# Patient Record
Sex: Female | Born: 1965 | Race: White | Hispanic: No | Marital: Single | Smoking: Former smoker
Health system: Southern US, Community
[De-identification: ages and names within clinical notes are randomized; demographics above are authoritative.]

## PROBLEM LIST (undated history)

## (undated) ENCOUNTER — Encounter
Attending: Student in an Organized Health Care Education/Training Program | Primary: Student in an Organized Health Care Education/Training Program

## (undated) ENCOUNTER — Encounter

## (undated) ENCOUNTER — Encounter: Attending: Psychiatric/Mental Health | Primary: Psychiatric/Mental Health

## (undated) ENCOUNTER — Ambulatory Visit: Payer: MEDICARE

## (undated) ENCOUNTER — Ambulatory Visit

## (undated) ENCOUNTER — Ambulatory Visit: Payer: MEDICARE | Attending: Clinical | Primary: Clinical

## (undated) ENCOUNTER — Telehealth

## (undated) ENCOUNTER — Telehealth: Attending: Clinical | Primary: Clinical

## (undated) ENCOUNTER — Encounter: Attending: Podiatrist | Primary: Podiatrist

## (undated) ENCOUNTER — Telehealth
Attending: Student in an Organized Health Care Education/Training Program | Primary: Student in an Organized Health Care Education/Training Program

## (undated) ENCOUNTER — Encounter: Attending: Clinical | Primary: Clinical

## (undated) ENCOUNTER — Ambulatory Visit: Payer: Medicare (Managed Care)

## (undated) ENCOUNTER — Telehealth: Payer: MEDICARE

## (undated) ENCOUNTER — Encounter: Attending: Vascular Surgery | Primary: Vascular Surgery

## (undated) ENCOUNTER — Ambulatory Visit: Attending: Vascular Surgery | Primary: Vascular Surgery

## (undated) ENCOUNTER — Ambulatory Visit
Payer: MEDICARE | Attending: Student in an Organized Health Care Education/Training Program | Primary: Student in an Organized Health Care Education/Training Program

## (undated) ENCOUNTER — Inpatient Hospital Stay

## (undated) ENCOUNTER — Encounter: Attending: Nephrology | Primary: Nephrology

## (undated) ENCOUNTER — Ambulatory Visit: Attending: Social Worker | Primary: Social Worker

## (undated) ENCOUNTER — Encounter: Attending: Foot & Ankle Surgery | Primary: Foot & Ankle Surgery

## (undated) ENCOUNTER — Encounter: Attending: Geriatric Psychiatry | Primary: Geriatric Psychiatry

## (undated) ENCOUNTER — Encounter: Attending: Family Medicine | Primary: Family Medicine

## (undated) ENCOUNTER — Ambulatory Visit: Payer: MEDICARE | Attending: Psychologist | Primary: Psychologist

## (undated) ENCOUNTER — Telehealth: Attending: Nephrology | Primary: Nephrology

## (undated) ENCOUNTER — Ambulatory Visit: Payer: MEDICARE | Attending: Sports Medicine | Primary: Sports Medicine

## (undated) ENCOUNTER — Ambulatory Visit: Payer: MEDICARE | Attending: Psychiatric/Mental Health | Primary: Psychiatric/Mental Health

## (undated) ENCOUNTER — Ambulatory Visit: Attending: Foot & Ankle Surgery | Primary: Foot & Ankle Surgery

## (undated) ENCOUNTER — Ambulatory Visit: Payer: MEDICARE | Attending: Ophthalmology | Primary: Ophthalmology

## (undated) ENCOUNTER — Encounter: Payer: Medicare (Managed Care) | Attending: Psychiatric/Mental Health | Primary: Psychiatric/Mental Health

## (undated) ENCOUNTER — Telehealth: Attending: Psychiatric/Mental Health | Primary: Psychiatric/Mental Health

## (undated) ENCOUNTER — Ambulatory Visit: Payer: MEDICARE | Attending: Foot & Ankle Surgery | Primary: Foot & Ankle Surgery

## (undated) ENCOUNTER — Ambulatory Visit: Payer: MEDICARE | Attending: Vascular Surgery | Primary: Vascular Surgery

## (undated) ENCOUNTER — Ambulatory Visit
Payer: Medicare (Managed Care) | Attending: Student in an Organized Health Care Education/Training Program | Primary: Student in an Organized Health Care Education/Training Program

## (undated) ENCOUNTER — Encounter: Attending: Psychiatry | Primary: Psychiatry

## (undated) ENCOUNTER — Telehealth: Payer: MEDICARE | Attending: Family Medicine | Primary: Family Medicine

## (undated) ENCOUNTER — Ambulatory Visit: Payer: MEDICARE | Attending: Surgery | Primary: Surgery

## (undated) ENCOUNTER — Ambulatory Visit: Payer: MEDICARE | Attending: Family | Primary: Family

## (undated) ENCOUNTER — Telehealth: Attending: Professional | Primary: Professional

## (undated) ENCOUNTER — Ambulatory Visit
Payer: MEDICARE | Attending: Public Health & General Preventive Medicine | Primary: Public Health & General Preventive Medicine

## (undated) ENCOUNTER — Encounter: Attending: Public Health & General Preventive Medicine | Primary: Public Health & General Preventive Medicine

## (undated) ENCOUNTER — Ambulatory Visit: Payer: MEDICARE | Attending: Family Medicine | Primary: Family Medicine

## (undated) ENCOUNTER — Telehealth
Payer: MEDICARE | Attending: Student in an Organized Health Care Education/Training Program | Primary: Student in an Organized Health Care Education/Training Program

## (undated) ENCOUNTER — Ambulatory Visit
Payer: MEDICARE | Attending: Rehabilitative and Restorative Service Providers" | Primary: Rehabilitative and Restorative Service Providers"

## (undated) ENCOUNTER — Telehealth: Attending: Rural Health | Primary: Rural Health

## (undated) ENCOUNTER — Ambulatory Visit: Payer: MEDICARE | Attending: Student Health | Primary: Student Health

## (undated) ENCOUNTER — Telehealth: Attending: "Endocrinology | Primary: "Endocrinology

## (undated) ENCOUNTER — Ambulatory Visit
Payer: MEDICARE | Attending: Neurology with Special Qualifications in Child Neurology | Primary: Neurology with Special Qualifications in Child Neurology

## (undated) ENCOUNTER — Ambulatory Visit: Payer: MEDICARE | Attending: Physician Assistant | Primary: Physician Assistant

## (undated) ENCOUNTER — Encounter: Attending: Family | Primary: Family

## (undated) DIAGNOSIS — E119 Type 2 diabetes mellitus without complications: Secondary | ICD-10-CM

## (undated) DIAGNOSIS — E079 Disorder of thyroid, unspecified: Secondary | ICD-10-CM

## (undated) DIAGNOSIS — I519 Heart disease, unspecified: Secondary | ICD-10-CM

## (undated) DIAGNOSIS — I10 Essential (primary) hypertension: Secondary | ICD-10-CM

## (undated) DIAGNOSIS — I2699 Other pulmonary embolism without acute cor pulmonale: Secondary | ICD-10-CM

## (undated) DIAGNOSIS — N289 Disorder of kidney and ureter, unspecified: Secondary | ICD-10-CM

## (undated) HISTORY — PX: ABDOMINAL HYSTERECTOMY: SHX81

## (undated) HISTORY — PX: GASTRIC BYPASS: SHX52

## (undated) HISTORY — PX: HERNIA REPAIR: SHX51

## (undated) HISTORY — PX: BACK SURGERY: SHX140

## (undated) HISTORY — PX: GALLBLADDER SURGERY: SHX652

## (undated) HISTORY — PX: AV FISTULA PLACEMENT: SHX1204

## (undated) HISTORY — PX: COSMETIC SURGERY: SHX468

## (undated) MED ORDER — ALBUTEROL SULFATE 2.5 MG/3 ML (0.083 %) SOLUTION FOR NEBULIZATION: 0 days

## (undated) MED ORDER — METOCLOPRAMIDE 10 MG TABLET: Freq: Every day | ORAL | 0.00000 days | PRN

## (undated) MED ORDER — LOSARTAN 25 MG TABLET: Freq: Every day | ORAL | 0 days

## (undated) MED ORDER — HYDROXYZINE HCL 10 MG TABLET: Freq: Three times a day (TID) | ORAL | 0.00000 days | PRN

## (undated) MED ORDER — CYCLOBENZAPRINE 5 MG TABLET: 0 days

## (undated) MED ORDER — ERGOCALCIFEROL (VITAMIN D2) 1,250 MCG (50,000 UNIT) CAPSULE: 0 days

## (undated) MED ORDER — METOPROLOL TARTRATE 100 MG TABLET: 0 days

## (undated) MED ORDER — INSULIN ASPART (U-100) 100 UNIT/ML (3 ML) SUBCUTANEOUS PEN: 0 days

## (undated) MED ORDER — BISACODYL 10 MG RECTAL SUPPOSITORY: 0.00000 days

## (undated) MED ORDER — PROMETHAZINE 25 MG TABLET: 0 days

---
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
## 2017-08-18 ENCOUNTER — Inpatient Hospital Stay
Admission: EM | Admit: 2017-08-18 | Discharge: 2017-09-16 | Disposition: A | Discharge status: Home / Self Care | Payer: MEDICARE | Source: Intra-hospital | Attending: Certified Registered" | Admitting: Certified Registered"

## 2017-08-18 ENCOUNTER — Inpatient Hospital Stay
Admission: EM | Admit: 2017-08-18 | Discharge: 2017-09-16 | Disposition: A | Discharge status: Home / Self Care | Payer: MEDICARE | Source: Intra-hospital

## 2017-08-18 ENCOUNTER — Inpatient Hospital Stay
Admission: EM | Admit: 2017-08-18 | Discharge: 2017-09-16 | Disposition: A | Discharge status: Home / Self Care | Payer: MEDICARE | Source: Intra-hospital | Attending: Internal Medicine | Admitting: Internal Medicine

## 2017-08-18 DIAGNOSIS — L03114 Cellulitis of left upper limb: Principal | ICD-10-CM

## 2017-08-19 DIAGNOSIS — L03114 Cellulitis of left upper limb: Principal | ICD-10-CM

## 2017-08-20 DIAGNOSIS — L03114 Cellulitis of left upper limb: Principal | ICD-10-CM

## 2017-08-30 DIAGNOSIS — L03114 Cellulitis of left upper limb: Principal | ICD-10-CM

## 2017-09-12 DIAGNOSIS — L03114 Cellulitis of left upper limb: Principal | ICD-10-CM

## 2017-09-13 DIAGNOSIS — L03114 Cellulitis of left upper limb: Principal | ICD-10-CM

## 2017-09-16 MED ORDER — POLYETHYLENE GLYCOL 3350 17 GRAM ORAL POWDER PACKET
PACK | Freq: Every day | ORAL | 0 refills | 0.00000 days | Status: CP | PRN
Start: 2017-09-16 — End: 2017-09-16

## 2017-09-16 MED ORDER — NICOTINE (POLACRILEX) 4 MG GUM
BUCCAL | 0 refills | 0.00000 days | PRN
Start: 2017-09-16 — End: 2017-10-16

## 2017-09-16 MED ORDER — METOPROLOL SUCCINATE ER 50 MG TABLET,EXTENDED RELEASE 24 HR
ORAL_TABLET | Freq: Two times a day (BID) | ORAL | 0 refills | 0.00000 days | Status: CP
Start: 2017-09-16 — End: 2018-12-25

## 2017-09-16 MED ORDER — QUETIAPINE 100 MG TABLET: 100 mg | each | 0 refills | 0 days

## 2017-09-16 MED ORDER — CITALOPRAM 40 MG TABLET
ORAL_TABLET | Freq: Every evening | ORAL | 0 refills | 0.00000 days | Status: CP
Start: 2017-09-16 — End: 2018-12-25

## 2017-09-16 MED ORDER — BENZTROPINE 0.5 MG TABLET: 1 mg | tablet | Freq: Two times a day (BID) | 11 refills | 0 days | Status: AC

## 2017-09-16 MED ORDER — ACETAMINOPHEN 325 MG TABLET
Freq: Four times a day (QID) | ORAL | 0 refills | 0.00000 days
Start: 2017-09-16 — End: 2017-09-23

## 2017-09-16 MED ORDER — GABAPENTIN 100 MG CAPSULE: 200 mg | capsule | Freq: Two times a day (BID) | 0 refills | 0 days | Status: AC

## 2017-09-16 MED ORDER — TRAZODONE 50 MG TABLET
ORAL_TABLET | Freq: Every evening | ORAL | 3 refills | 0.00000 days | Status: CP | PRN
Start: 2017-09-16 — End: 2017-09-16

## 2017-09-16 MED ORDER — OXYCODONE 10 MG TABLET: 10 mg | tablet | 0 refills | 0 days | Status: AC

## 2017-09-16 MED ORDER — SENNOSIDES 8.6 MG TABLET: 2 | tablet | Freq: Two times a day (BID) | 0 refills | 0 days | Status: AC

## 2017-09-16 MED ORDER — POLYETHYLENE GLYCOL 3350 17 GRAM ORAL POWDER PACKET: 17 g | packet | Freq: Every day | 0 refills | 0 days | Status: AC

## 2017-09-16 MED ORDER — QUETIAPINE 100 MG TABLET: 100 mg | tablet | Freq: Every evening | 0 refills | 0 days | Status: AC

## 2017-09-16 MED ORDER — TRAZODONE 50 MG TABLET: 50 mg | tablet | Freq: Every evening | 3 refills | 0 days | Status: AC

## 2017-09-16 MED ORDER — GABAPENTIN 400 MG CAPSULE: 400 mg | capsule | Freq: Every evening | 0 refills | 0 days | Status: AC

## 2017-09-16 MED ORDER — GABAPENTIN 100 MG CAPSULE
ORAL_CAPSULE | Freq: Two times a day (BID) | ORAL | 0 refills | 0.00000 days | Status: CP
Start: 2017-09-16 — End: 2017-09-16

## 2017-09-16 MED ORDER — GABAPENTIN 400 MG CAPSULE
ORAL_CAPSULE | Freq: Every evening | ORAL | 0 refills | 0.00000 days | Status: CP
Start: 2017-09-16 — End: 2017-09-16

## 2017-09-16 MED ORDER — MELATONIN 3 MG TABLET
ORAL_TABLET | Freq: Every evening | ORAL | 0 refills | 0 days | Status: CP
Start: 2017-09-16 — End: ?

## 2017-09-16 MED ORDER — GABAPENTIN 400 MG CAPSULE: 400 mg | capsule | Freq: Every evening | 0 refills | 0 days | Status: SS

## 2017-09-16 MED ORDER — NICOTINE (POLACRILEX) 4 MG GUM: 4 mg | each | 0 refills | 0 days

## 2017-09-16 MED ORDER — OXYCODONE 10 MG TABLET
ORAL_TABLET | ORAL | 0 refills | 0.00000 days | Status: CP | PRN
Start: 2017-09-16 — End: 2017-09-16

## 2017-09-16 MED ORDER — POLYETHYLENE GLYCOL 3350 17 GRAM ORAL POWDER PACKET: g | 0 refills | 0 days

## 2017-09-16 MED ORDER — METFORMIN 500 MG TABLET: 500 mg | tablet | Freq: Two times a day (BID) | 3 refills | 0 days | Status: AC

## 2017-09-16 MED ORDER — METFORMIN 500 MG TABLET: 500 mg | tablet | 3 refills | 0 days

## 2017-09-16 MED ORDER — BENZTROPINE 0.5 MG TABLET
ORAL_TABLET | Freq: Two times a day (BID) | ORAL | 3 refills | 0.00000 days | Status: CP
Start: 2017-09-16 — End: 2018-12-29

## 2017-09-16 MED ORDER — METFORMIN 500 MG TABLET
ORAL_TABLET | Freq: Two times a day (BID) | ORAL | 3 refills | 0.00000 days | Status: CP
Start: 2017-09-16 — End: 2018-12-25

## 2017-09-16 MED ORDER — SENNOSIDES 8.6 MG TABLET
ORAL_TABLET | Freq: Two times a day (BID) | ORAL | 0 refills | 0.00000 days | Status: CP
Start: 2017-09-16 — End: 2018-09-16

## 2017-09-16 MED ORDER — QUETIAPINE 100 MG TABLET
ORAL_TABLET | Freq: Every evening | ORAL | 0 refills | 0.00000 days | Status: CP
Start: 2017-09-16 — End: 2017-09-16

## 2017-09-16 MED ORDER — INSULIN NPH ISOPHANE U-100 HUMAN 100 UNIT/ML SUBCUTANEOUS SUSPENSION
Freq: Every day | SUBCUTANEOUS | 0 refills | 0 days | Status: CP
Start: 2017-09-16 — End: 2018-12-25

## 2017-09-16 MED ORDER — CITALOPRAM 40 MG TABLET: 40 mg | tablet | Freq: Every evening | 0 refills | 0 days | Status: AC

## 2017-09-16 MED ORDER — GABAPENTIN 100 MG CAPSULE: capsule | 0 refills | 0 days | Status: SS

## 2017-09-16 MED ORDER — INSULIN GLARGINE (U-100) 100 UNIT/ML SUBCUTANEOUS SOLUTION: 30 [IU] | mL | 2 refills | 0 days

## 2017-09-16 MED ORDER — INSULIN GLARGINE (U-100) 100 UNIT/ML SUBCUTANEOUS SOLUTION
Freq: Two times a day (BID) | SUBCUTANEOUS | 2 refills | 0.00000 days | Status: CP
Start: 2017-09-16 — End: 2017-09-16

## 2017-09-16 MED ORDER — MELATONIN 5 MG TABLET
ORAL_TABLET | Freq: Every evening | ORAL | 0 refills | 0.00000 days | PRN
Start: 2017-09-16 — End: 2018-12-25

## 2017-09-16 MED ORDER — METOPROLOL SUCCINATE ER 50 MG TABLET,EXTENDED RELEASE 24 HR: 50 mg | tablet | Freq: Two times a day (BID) | 0 refills | 0 days | Status: AC

## 2017-09-16 MED ORDER — TRAZODONE 150 MG TABLET
ORAL_TABLET | Freq: Every evening | ORAL | 3 refills | 0 days | Status: CP
Start: 2017-09-16 — End: 2018-12-25

## 2017-09-21 ENCOUNTER — Ambulatory Visit: Admission: RE | Admit: 2017-09-21 | Discharge: 2017-09-21 | Payer: MEDICARE | Attending: Orthopaedic Surgery

## 2017-09-21 DIAGNOSIS — S61509A Unspecified open wound of unspecified wrist, initial encounter: Principal | ICD-10-CM

## 2017-09-21 MED ORDER — TRAMADOL 50 MG TABLET
ORAL_TABLET | Freq: Four times a day (QID) | ORAL | 0 refills | 0.00000 days | Status: CP
Start: 2017-09-21 — End: 2018-12-25

## 2018-12-25 ENCOUNTER — Encounter: Admit: 2018-12-25 | Discharge: 2018-12-29 | Payer: MEDICARE

## 2018-12-25 ENCOUNTER — Ambulatory Visit: Admit: 2018-12-25 | Discharge: 2018-12-29 | Payer: MEDICARE

## 2018-12-25 ENCOUNTER — Non-Acute Institutional Stay: Admit: 2018-12-25 | Discharge: 2018-12-29 | Payer: MEDICARE

## 2018-12-25 DIAGNOSIS — R42 Dizziness and giddiness: Principal | ICD-10-CM

## 2018-12-26 DIAGNOSIS — R42 Dizziness and giddiness: Principal | ICD-10-CM

## 2018-12-28 DIAGNOSIS — R42 Dizziness and giddiness: Principal | ICD-10-CM

## 2018-12-29 MED ORDER — ISOSORBIDE MONONITRATE ER 30 MG TABLET,EXTENDED RELEASE 24 HR
ORAL_TABLET | Freq: Every day | ORAL | 0 refills | 0.00000 days | Status: CP
Start: 2018-12-29 — End: ?

## 2018-12-29 MED ORDER — GABAPENTIN 100 MG CAPSULE
ORAL_CAPSULE | Freq: Two times a day (BID) | ORAL | 0 refills | 0.00000 days | Status: CP
Start: 2018-12-29 — End: 2019-01-28

## 2018-12-29 MED ORDER — SODIUM POLYSTYRENE SULFONATE 15 GRAM/60 ML ORAL SUSPENSION
0 refills | 0 days | Status: CP
Start: 2018-12-29 — End: ?

## 2018-12-29 MED ORDER — GABAPENTIN 400 MG CAPSULE
ORAL_CAPSULE | Freq: Every evening | ORAL | 0 refills | 0 days | Status: CP
Start: 2018-12-29 — End: 2019-01-28

## 2018-12-29 MED ORDER — FUROSEMIDE 40 MG TABLET
ORAL_TABLET | Freq: Every day | ORAL | 0 refills | 0 days
Start: 2018-12-29 — End: 2019-01-28

## 2021-01-06 DIAGNOSIS — R339 Retention of urine, unspecified: Principal | ICD-10-CM

## 2021-01-06 DIAGNOSIS — I252 Old myocardial infarction: Principal | ICD-10-CM

## 2021-01-06 DIAGNOSIS — G894 Chronic pain syndrome: Principal | ICD-10-CM

## 2021-01-06 DIAGNOSIS — Z9049 Acquired absence of other specified parts of digestive tract: Principal | ICD-10-CM

## 2021-01-06 DIAGNOSIS — Z7982 Long term (current) use of aspirin: Principal | ICD-10-CM

## 2021-01-06 DIAGNOSIS — E1022 Type 1 diabetes mellitus with diabetic chronic kidney disease: Principal | ICD-10-CM

## 2021-01-06 DIAGNOSIS — E039 Hypothyroidism, unspecified: Principal | ICD-10-CM

## 2021-01-06 DIAGNOSIS — Z955 Presence of coronary angioplasty implant and graft: Principal | ICD-10-CM

## 2021-01-06 DIAGNOSIS — I132 Hypertensive heart and chronic kidney disease with heart failure and with stage 5 chronic kidney disease, or end stage renal disease: Principal | ICD-10-CM

## 2021-01-06 DIAGNOSIS — E871 Hypo-osmolality and hyponatremia: Principal | ICD-10-CM

## 2021-01-06 DIAGNOSIS — L03114 Cellulitis of left upper limb: Principal | ICD-10-CM

## 2021-01-06 DIAGNOSIS — I059 Rheumatic mitral valve disease, unspecified: Principal | ICD-10-CM

## 2021-01-06 DIAGNOSIS — F1120 Opioid dependence, uncomplicated: Principal | ICD-10-CM

## 2021-01-06 DIAGNOSIS — Z90710 Acquired absence of both cervix and uterus: Principal | ICD-10-CM

## 2021-01-06 DIAGNOSIS — Z20822 Contact with and (suspected) exposure to covid-19: Principal | ICD-10-CM

## 2021-01-06 DIAGNOSIS — B182 Chronic viral hepatitis C: Principal | ICD-10-CM

## 2021-01-06 DIAGNOSIS — Z951 Presence of aortocoronary bypass graft: Principal | ICD-10-CM

## 2021-01-06 DIAGNOSIS — F419 Anxiety disorder, unspecified: Principal | ICD-10-CM

## 2021-01-06 DIAGNOSIS — T82898A Other specified complication of vascular prosthetic devices, implants and grafts, initial encounter: Principal | ICD-10-CM

## 2021-01-06 DIAGNOSIS — Z79899 Other long term (current) drug therapy: Principal | ICD-10-CM

## 2021-01-06 DIAGNOSIS — F319 Bipolar disorder, unspecified: Principal | ICD-10-CM

## 2021-01-06 DIAGNOSIS — M5136 Other intervertebral disc degeneration, lumbar region: Principal | ICD-10-CM

## 2021-01-06 DIAGNOSIS — I2510 Atherosclerotic heart disease of native coronary artery without angina pectoris: Principal | ICD-10-CM

## 2021-01-06 DIAGNOSIS — Z87891 Personal history of nicotine dependence: Principal | ICD-10-CM

## 2021-01-06 DIAGNOSIS — K219 Gastro-esophageal reflux disease without esophagitis: Principal | ICD-10-CM

## 2021-01-06 DIAGNOSIS — G928 Other toxic encephalopathy: Principal | ICD-10-CM

## 2021-01-06 DIAGNOSIS — T402X5A Adverse effect of other opioids, initial encounter: Principal | ICD-10-CM

## 2021-01-06 DIAGNOSIS — E10649 Type 1 diabetes mellitus with hypoglycemia without coma: Principal | ICD-10-CM

## 2021-01-06 DIAGNOSIS — I350 Nonrheumatic aortic (valve) stenosis: Principal | ICD-10-CM

## 2021-01-06 DIAGNOSIS — Z9884 Bariatric surgery status: Principal | ICD-10-CM

## 2021-01-06 DIAGNOSIS — E875 Hyperkalemia: Principal | ICD-10-CM

## 2021-01-06 DIAGNOSIS — Z794 Long term (current) use of insulin: Principal | ICD-10-CM

## 2021-01-06 DIAGNOSIS — F1910 Other psychoactive substance abuse, uncomplicated: Principal | ICD-10-CM

## 2021-01-06 DIAGNOSIS — I5032 Chronic diastolic (congestive) heart failure: Principal | ICD-10-CM

## 2021-01-06 DIAGNOSIS — R0902 Hypoxemia: Principal | ICD-10-CM

## 2021-01-06 DIAGNOSIS — Z992 Dependence on renal dialysis: Principal | ICD-10-CM

## 2021-01-06 DIAGNOSIS — F11121 Opioid abuse with intoxication delirium: Principal | ICD-10-CM

## 2021-01-06 DIAGNOSIS — M1990 Unspecified osteoarthritis, unspecified site: Principal | ICD-10-CM

## 2021-01-06 DIAGNOSIS — E222 Syndrome of inappropriate secretion of antidiuretic hormone: Principal | ICD-10-CM

## 2021-01-06 DIAGNOSIS — D631 Anemia in chronic kidney disease: Principal | ICD-10-CM

## 2021-01-06 DIAGNOSIS — E785 Hyperlipidemia, unspecified: Principal | ICD-10-CM

## 2021-01-06 DIAGNOSIS — E8770 Fluid overload, unspecified: Principal | ICD-10-CM

## 2021-01-06 DIAGNOSIS — Z6834 Body mass index (BMI) 34.0-34.9, adult: Principal | ICD-10-CM

## 2021-01-06 DIAGNOSIS — N186 End stage renal disease: Principal | ICD-10-CM

## 2021-01-07 ENCOUNTER — Ambulatory Visit
Admit: 2021-01-07 | Discharge: 2021-01-15 | Disposition: A | Discharge status: Home Health | Payer: MEDICARE | Admitting: Nephrology

## 2021-01-07 DIAGNOSIS — Z79899 Other long term (current) drug therapy: Principal | ICD-10-CM

## 2021-01-07 DIAGNOSIS — Z87891 Personal history of nicotine dependence: Principal | ICD-10-CM

## 2021-01-07 DIAGNOSIS — G928 Other toxic encephalopathy: Principal | ICD-10-CM

## 2021-01-07 DIAGNOSIS — E785 Hyperlipidemia, unspecified: Principal | ICD-10-CM

## 2021-01-07 DIAGNOSIS — D631 Anemia in chronic kidney disease: Principal | ICD-10-CM

## 2021-01-07 DIAGNOSIS — M1990 Unspecified osteoarthritis, unspecified site: Principal | ICD-10-CM

## 2021-01-07 DIAGNOSIS — E8770 Fluid overload, unspecified: Principal | ICD-10-CM

## 2021-01-07 DIAGNOSIS — K219 Gastro-esophageal reflux disease without esophagitis: Principal | ICD-10-CM

## 2021-01-07 DIAGNOSIS — E039 Hypothyroidism, unspecified: Principal | ICD-10-CM

## 2021-01-07 DIAGNOSIS — I5032 Chronic diastolic (congestive) heart failure: Principal | ICD-10-CM

## 2021-01-07 DIAGNOSIS — Z7982 Long term (current) use of aspirin: Principal | ICD-10-CM

## 2021-01-07 DIAGNOSIS — R339 Retention of urine, unspecified: Principal | ICD-10-CM

## 2021-01-07 DIAGNOSIS — T402X5A Adverse effect of other opioids, initial encounter: Principal | ICD-10-CM

## 2021-01-07 DIAGNOSIS — E871 Hypo-osmolality and hyponatremia: Principal | ICD-10-CM

## 2021-01-07 DIAGNOSIS — I2510 Atherosclerotic heart disease of native coronary artery without angina pectoris: Principal | ICD-10-CM

## 2021-01-07 DIAGNOSIS — F319 Bipolar disorder, unspecified: Principal | ICD-10-CM

## 2021-01-07 DIAGNOSIS — Z9049 Acquired absence of other specified parts of digestive tract: Principal | ICD-10-CM

## 2021-01-07 DIAGNOSIS — Z992 Dependence on renal dialysis: Principal | ICD-10-CM

## 2021-01-07 DIAGNOSIS — E1022 Type 1 diabetes mellitus with diabetic chronic kidney disease: Principal | ICD-10-CM

## 2021-01-07 DIAGNOSIS — M5136 Other intervertebral disc degeneration, lumbar region: Principal | ICD-10-CM

## 2021-01-07 DIAGNOSIS — G894 Chronic pain syndrome: Principal | ICD-10-CM

## 2021-01-07 DIAGNOSIS — Z794 Long term (current) use of insulin: Principal | ICD-10-CM

## 2021-01-07 DIAGNOSIS — T82898A Other specified complication of vascular prosthetic devices, implants and grafts, initial encounter: Principal | ICD-10-CM

## 2021-01-07 DIAGNOSIS — F11121 Opioid abuse with intoxication delirium: Principal | ICD-10-CM

## 2021-01-07 DIAGNOSIS — R0902 Hypoxemia: Principal | ICD-10-CM

## 2021-01-07 DIAGNOSIS — B182 Chronic viral hepatitis C: Principal | ICD-10-CM

## 2021-01-07 DIAGNOSIS — I132 Hypertensive heart and chronic kidney disease with heart failure and with stage 5 chronic kidney disease, or end stage renal disease: Principal | ICD-10-CM

## 2021-01-07 DIAGNOSIS — Z6834 Body mass index (BMI) 34.0-34.9, adult: Principal | ICD-10-CM

## 2021-01-07 DIAGNOSIS — Z9884 Bariatric surgery status: Principal | ICD-10-CM

## 2021-01-07 DIAGNOSIS — N186 End stage renal disease: Principal | ICD-10-CM

## 2021-01-07 DIAGNOSIS — E10649 Type 1 diabetes mellitus with hypoglycemia without coma: Principal | ICD-10-CM

## 2021-01-07 DIAGNOSIS — F419 Anxiety disorder, unspecified: Principal | ICD-10-CM

## 2021-01-07 DIAGNOSIS — I059 Rheumatic mitral valve disease, unspecified: Principal | ICD-10-CM

## 2021-01-07 DIAGNOSIS — Z955 Presence of coronary angioplasty implant and graft: Principal | ICD-10-CM

## 2021-01-07 DIAGNOSIS — E222 Syndrome of inappropriate secretion of antidiuretic hormone: Principal | ICD-10-CM

## 2021-01-07 DIAGNOSIS — E875 Hyperkalemia: Principal | ICD-10-CM

## 2021-01-07 DIAGNOSIS — Z951 Presence of aortocoronary bypass graft: Principal | ICD-10-CM

## 2021-01-07 DIAGNOSIS — Z90710 Acquired absence of both cervix and uterus: Principal | ICD-10-CM

## 2021-01-07 DIAGNOSIS — I252 Old myocardial infarction: Principal | ICD-10-CM

## 2021-01-07 DIAGNOSIS — Z20822 Contact with and (suspected) exposure to covid-19: Principal | ICD-10-CM

## 2021-01-07 DIAGNOSIS — L03114 Cellulitis of left upper limb: Principal | ICD-10-CM

## 2021-01-07 DIAGNOSIS — I350 Nonrheumatic aortic (valve) stenosis: Principal | ICD-10-CM

## 2021-01-08 DIAGNOSIS — Z9884 Bariatric surgery status: Principal | ICD-10-CM

## 2021-01-08 DIAGNOSIS — B182 Chronic viral hepatitis C: Principal | ICD-10-CM

## 2021-01-08 DIAGNOSIS — I350 Nonrheumatic aortic (valve) stenosis: Principal | ICD-10-CM

## 2021-01-08 DIAGNOSIS — Z955 Presence of coronary angioplasty implant and graft: Principal | ICD-10-CM

## 2021-01-08 DIAGNOSIS — R339 Retention of urine, unspecified: Principal | ICD-10-CM

## 2021-01-08 DIAGNOSIS — E222 Syndrome of inappropriate secretion of antidiuretic hormone: Principal | ICD-10-CM

## 2021-01-08 DIAGNOSIS — E871 Hypo-osmolality and hyponatremia: Principal | ICD-10-CM

## 2021-01-08 DIAGNOSIS — T402X5A Adverse effect of other opioids, initial encounter: Principal | ICD-10-CM

## 2021-01-08 DIAGNOSIS — G928 Other toxic encephalopathy: Principal | ICD-10-CM

## 2021-01-08 DIAGNOSIS — E875 Hyperkalemia: Principal | ICD-10-CM

## 2021-01-08 DIAGNOSIS — Z992 Dependence on renal dialysis: Principal | ICD-10-CM

## 2021-01-08 DIAGNOSIS — E10649 Type 1 diabetes mellitus with hypoglycemia without coma: Principal | ICD-10-CM

## 2021-01-08 DIAGNOSIS — F319 Bipolar disorder, unspecified: Principal | ICD-10-CM

## 2021-01-08 DIAGNOSIS — I2510 Atherosclerotic heart disease of native coronary artery without angina pectoris: Principal | ICD-10-CM

## 2021-01-08 DIAGNOSIS — Z6834 Body mass index (BMI) 34.0-34.9, adult: Principal | ICD-10-CM

## 2021-01-08 DIAGNOSIS — I252 Old myocardial infarction: Principal | ICD-10-CM

## 2021-01-08 DIAGNOSIS — I5032 Chronic diastolic (congestive) heart failure: Principal | ICD-10-CM

## 2021-01-08 DIAGNOSIS — Z794 Long term (current) use of insulin: Principal | ICD-10-CM

## 2021-01-08 DIAGNOSIS — N186 End stage renal disease: Principal | ICD-10-CM

## 2021-01-08 DIAGNOSIS — E039 Hypothyroidism, unspecified: Principal | ICD-10-CM

## 2021-01-08 DIAGNOSIS — Z20822 Contact with and (suspected) exposure to covid-19: Principal | ICD-10-CM

## 2021-01-08 DIAGNOSIS — G894 Chronic pain syndrome: Principal | ICD-10-CM

## 2021-01-08 DIAGNOSIS — L03114 Cellulitis of left upper limb: Principal | ICD-10-CM

## 2021-01-08 DIAGNOSIS — I059 Rheumatic mitral valve disease, unspecified: Principal | ICD-10-CM

## 2021-01-08 DIAGNOSIS — Z9049 Acquired absence of other specified parts of digestive tract: Principal | ICD-10-CM

## 2021-01-08 DIAGNOSIS — I132 Hypertensive heart and chronic kidney disease with heart failure and with stage 5 chronic kidney disease, or end stage renal disease: Principal | ICD-10-CM

## 2021-01-08 DIAGNOSIS — E8770 Fluid overload, unspecified: Principal | ICD-10-CM

## 2021-01-08 DIAGNOSIS — Z7982 Long term (current) use of aspirin: Principal | ICD-10-CM

## 2021-01-08 DIAGNOSIS — M1990 Unspecified osteoarthritis, unspecified site: Principal | ICD-10-CM

## 2021-01-08 DIAGNOSIS — R0902 Hypoxemia: Principal | ICD-10-CM

## 2021-01-08 DIAGNOSIS — T82898A Other specified complication of vascular prosthetic devices, implants and grafts, initial encounter: Principal | ICD-10-CM

## 2021-01-08 DIAGNOSIS — M5136 Other intervertebral disc degeneration, lumbar region: Principal | ICD-10-CM

## 2021-01-08 DIAGNOSIS — Z951 Presence of aortocoronary bypass graft: Principal | ICD-10-CM

## 2021-01-08 DIAGNOSIS — F419 Anxiety disorder, unspecified: Principal | ICD-10-CM

## 2021-01-08 DIAGNOSIS — Z79899 Other long term (current) drug therapy: Principal | ICD-10-CM

## 2021-01-08 DIAGNOSIS — K219 Gastro-esophageal reflux disease without esophagitis: Principal | ICD-10-CM

## 2021-01-08 DIAGNOSIS — F11121 Opioid abuse with intoxication delirium: Principal | ICD-10-CM

## 2021-01-08 DIAGNOSIS — D631 Anemia in chronic kidney disease: Principal | ICD-10-CM

## 2021-01-08 DIAGNOSIS — E785 Hyperlipidemia, unspecified: Principal | ICD-10-CM

## 2021-01-08 DIAGNOSIS — Z90710 Acquired absence of both cervix and uterus: Principal | ICD-10-CM

## 2021-01-08 DIAGNOSIS — E1022 Type 1 diabetes mellitus with diabetic chronic kidney disease: Principal | ICD-10-CM

## 2021-01-08 DIAGNOSIS — Z87891 Personal history of nicotine dependence: Principal | ICD-10-CM

## 2021-01-09 DIAGNOSIS — F419 Anxiety disorder, unspecified: Principal | ICD-10-CM

## 2021-01-09 DIAGNOSIS — R0902 Hypoxemia: Principal | ICD-10-CM

## 2021-01-09 DIAGNOSIS — Z794 Long term (current) use of insulin: Principal | ICD-10-CM

## 2021-01-09 DIAGNOSIS — T82898A Other specified complication of vascular prosthetic devices, implants and grafts, initial encounter: Principal | ICD-10-CM

## 2021-01-09 DIAGNOSIS — B182 Chronic viral hepatitis C: Principal | ICD-10-CM

## 2021-01-09 DIAGNOSIS — Z9884 Bariatric surgery status: Principal | ICD-10-CM

## 2021-01-09 DIAGNOSIS — E1022 Type 1 diabetes mellitus with diabetic chronic kidney disease: Principal | ICD-10-CM

## 2021-01-09 DIAGNOSIS — I252 Old myocardial infarction: Principal | ICD-10-CM

## 2021-01-09 DIAGNOSIS — M5136 Other intervertebral disc degeneration, lumbar region: Principal | ICD-10-CM

## 2021-01-09 DIAGNOSIS — I350 Nonrheumatic aortic (valve) stenosis: Principal | ICD-10-CM

## 2021-01-09 DIAGNOSIS — E8770 Fluid overload, unspecified: Principal | ICD-10-CM

## 2021-01-09 DIAGNOSIS — E222 Syndrome of inappropriate secretion of antidiuretic hormone: Principal | ICD-10-CM

## 2021-01-09 DIAGNOSIS — Z20822 Contact with and (suspected) exposure to covid-19: Principal | ICD-10-CM

## 2021-01-09 DIAGNOSIS — Z90710 Acquired absence of both cervix and uterus: Principal | ICD-10-CM

## 2021-01-09 DIAGNOSIS — Z87891 Personal history of nicotine dependence: Principal | ICD-10-CM

## 2021-01-09 DIAGNOSIS — Z951 Presence of aortocoronary bypass graft: Principal | ICD-10-CM

## 2021-01-09 DIAGNOSIS — F11121 Opioid abuse with intoxication delirium: Principal | ICD-10-CM

## 2021-01-09 DIAGNOSIS — E871 Hypo-osmolality and hyponatremia: Principal | ICD-10-CM

## 2021-01-09 DIAGNOSIS — E875 Hyperkalemia: Principal | ICD-10-CM

## 2021-01-09 DIAGNOSIS — F319 Bipolar disorder, unspecified: Principal | ICD-10-CM

## 2021-01-09 DIAGNOSIS — E039 Hypothyroidism, unspecified: Principal | ICD-10-CM

## 2021-01-09 DIAGNOSIS — M1990 Unspecified osteoarthritis, unspecified site: Principal | ICD-10-CM

## 2021-01-09 DIAGNOSIS — Z6834 Body mass index (BMI) 34.0-34.9, adult: Principal | ICD-10-CM

## 2021-01-09 DIAGNOSIS — I059 Rheumatic mitral valve disease, unspecified: Principal | ICD-10-CM

## 2021-01-09 DIAGNOSIS — G928 Other toxic encephalopathy: Principal | ICD-10-CM

## 2021-01-09 DIAGNOSIS — Z992 Dependence on renal dialysis: Principal | ICD-10-CM

## 2021-01-09 DIAGNOSIS — R339 Retention of urine, unspecified: Principal | ICD-10-CM

## 2021-01-09 DIAGNOSIS — I5032 Chronic diastolic (congestive) heart failure: Principal | ICD-10-CM

## 2021-01-09 DIAGNOSIS — N186 End stage renal disease: Principal | ICD-10-CM

## 2021-01-09 DIAGNOSIS — D631 Anemia in chronic kidney disease: Principal | ICD-10-CM

## 2021-01-09 DIAGNOSIS — Z79899 Other long term (current) drug therapy: Principal | ICD-10-CM

## 2021-01-09 DIAGNOSIS — L03114 Cellulitis of left upper limb: Principal | ICD-10-CM

## 2021-01-09 DIAGNOSIS — T402X5A Adverse effect of other opioids, initial encounter: Principal | ICD-10-CM

## 2021-01-09 DIAGNOSIS — Z7982 Long term (current) use of aspirin: Principal | ICD-10-CM

## 2021-01-09 DIAGNOSIS — I2510 Atherosclerotic heart disease of native coronary artery without angina pectoris: Principal | ICD-10-CM

## 2021-01-09 DIAGNOSIS — E10649 Type 1 diabetes mellitus with hypoglycemia without coma: Principal | ICD-10-CM

## 2021-01-09 DIAGNOSIS — G894 Chronic pain syndrome: Principal | ICD-10-CM

## 2021-01-09 DIAGNOSIS — E785 Hyperlipidemia, unspecified: Principal | ICD-10-CM

## 2021-01-09 DIAGNOSIS — Z955 Presence of coronary angioplasty implant and graft: Principal | ICD-10-CM

## 2021-01-09 DIAGNOSIS — K219 Gastro-esophageal reflux disease without esophagitis: Principal | ICD-10-CM

## 2021-01-09 DIAGNOSIS — Z9049 Acquired absence of other specified parts of digestive tract: Principal | ICD-10-CM

## 2021-01-09 DIAGNOSIS — I132 Hypertensive heart and chronic kidney disease with heart failure and with stage 5 chronic kidney disease, or end stage renal disease: Principal | ICD-10-CM

## 2021-01-11 DIAGNOSIS — Z951 Presence of aortocoronary bypass graft: Principal | ICD-10-CM

## 2021-01-11 DIAGNOSIS — Z79899 Other long term (current) drug therapy: Principal | ICD-10-CM

## 2021-01-11 DIAGNOSIS — Z794 Long term (current) use of insulin: Principal | ICD-10-CM

## 2021-01-11 DIAGNOSIS — M1990 Unspecified osteoarthritis, unspecified site: Principal | ICD-10-CM

## 2021-01-11 DIAGNOSIS — G928 Other toxic encephalopathy: Principal | ICD-10-CM

## 2021-01-11 DIAGNOSIS — E1022 Type 1 diabetes mellitus with diabetic chronic kidney disease: Principal | ICD-10-CM

## 2021-01-11 DIAGNOSIS — E875 Hyperkalemia: Principal | ICD-10-CM

## 2021-01-11 DIAGNOSIS — E222 Syndrome of inappropriate secretion of antidiuretic hormone: Principal | ICD-10-CM

## 2021-01-11 DIAGNOSIS — G894 Chronic pain syndrome: Principal | ICD-10-CM

## 2021-01-11 DIAGNOSIS — K219 Gastro-esophageal reflux disease without esophagitis: Principal | ICD-10-CM

## 2021-01-11 DIAGNOSIS — I2510 Atherosclerotic heart disease of native coronary artery without angina pectoris: Principal | ICD-10-CM

## 2021-01-11 DIAGNOSIS — I350 Nonrheumatic aortic (valve) stenosis: Principal | ICD-10-CM

## 2021-01-11 DIAGNOSIS — E039 Hypothyroidism, unspecified: Principal | ICD-10-CM

## 2021-01-11 DIAGNOSIS — Z955 Presence of coronary angioplasty implant and graft: Principal | ICD-10-CM

## 2021-01-11 DIAGNOSIS — R0902 Hypoxemia: Principal | ICD-10-CM

## 2021-01-11 DIAGNOSIS — F319 Bipolar disorder, unspecified: Principal | ICD-10-CM

## 2021-01-11 DIAGNOSIS — N186 End stage renal disease: Principal | ICD-10-CM

## 2021-01-11 DIAGNOSIS — M5136 Other intervertebral disc degeneration, lumbar region: Principal | ICD-10-CM

## 2021-01-11 DIAGNOSIS — E871 Hypo-osmolality and hyponatremia: Principal | ICD-10-CM

## 2021-01-11 DIAGNOSIS — Z6834 Body mass index (BMI) 34.0-34.9, adult: Principal | ICD-10-CM

## 2021-01-11 DIAGNOSIS — Z9884 Bariatric surgery status: Principal | ICD-10-CM

## 2021-01-11 DIAGNOSIS — Z20822 Contact with and (suspected) exposure to covid-19: Principal | ICD-10-CM

## 2021-01-11 DIAGNOSIS — B182 Chronic viral hepatitis C: Principal | ICD-10-CM

## 2021-01-11 DIAGNOSIS — T402X5A Adverse effect of other opioids, initial encounter: Principal | ICD-10-CM

## 2021-01-11 DIAGNOSIS — I132 Hypertensive heart and chronic kidney disease with heart failure and with stage 5 chronic kidney disease, or end stage renal disease: Principal | ICD-10-CM

## 2021-01-11 DIAGNOSIS — T82898A Other specified complication of vascular prosthetic devices, implants and grafts, initial encounter: Principal | ICD-10-CM

## 2021-01-11 DIAGNOSIS — L03114 Cellulitis of left upper limb: Principal | ICD-10-CM

## 2021-01-11 DIAGNOSIS — I059 Rheumatic mitral valve disease, unspecified: Principal | ICD-10-CM

## 2021-01-11 DIAGNOSIS — E10649 Type 1 diabetes mellitus with hypoglycemia without coma: Principal | ICD-10-CM

## 2021-01-11 DIAGNOSIS — Z87891 Personal history of nicotine dependence: Principal | ICD-10-CM

## 2021-01-11 DIAGNOSIS — E8770 Fluid overload, unspecified: Principal | ICD-10-CM

## 2021-01-11 DIAGNOSIS — F419 Anxiety disorder, unspecified: Principal | ICD-10-CM

## 2021-01-11 DIAGNOSIS — D631 Anemia in chronic kidney disease: Principal | ICD-10-CM

## 2021-01-11 DIAGNOSIS — R339 Retention of urine, unspecified: Principal | ICD-10-CM

## 2021-01-11 DIAGNOSIS — Z9049 Acquired absence of other specified parts of digestive tract: Principal | ICD-10-CM

## 2021-01-11 DIAGNOSIS — Z992 Dependence on renal dialysis: Principal | ICD-10-CM

## 2021-01-11 DIAGNOSIS — Z90710 Acquired absence of both cervix and uterus: Principal | ICD-10-CM

## 2021-01-11 DIAGNOSIS — F11121 Opioid abuse with intoxication delirium: Principal | ICD-10-CM

## 2021-01-11 DIAGNOSIS — I5032 Chronic diastolic (congestive) heart failure: Principal | ICD-10-CM

## 2021-01-11 DIAGNOSIS — I252 Old myocardial infarction: Principal | ICD-10-CM

## 2021-01-11 DIAGNOSIS — Z7982 Long term (current) use of aspirin: Principal | ICD-10-CM

## 2021-01-11 DIAGNOSIS — E785 Hyperlipidemia, unspecified: Principal | ICD-10-CM

## 2021-01-12 DIAGNOSIS — F11121 Opioid abuse with intoxication delirium: Principal | ICD-10-CM

## 2021-01-12 DIAGNOSIS — E10649 Type 1 diabetes mellitus with hypoglycemia without coma: Principal | ICD-10-CM

## 2021-01-12 DIAGNOSIS — F419 Anxiety disorder, unspecified: Principal | ICD-10-CM

## 2021-01-12 DIAGNOSIS — B182 Chronic viral hepatitis C: Principal | ICD-10-CM

## 2021-01-12 DIAGNOSIS — I132 Hypertensive heart and chronic kidney disease with heart failure and with stage 5 chronic kidney disease, or end stage renal disease: Principal | ICD-10-CM

## 2021-01-12 DIAGNOSIS — Z992 Dependence on renal dialysis: Principal | ICD-10-CM

## 2021-01-12 DIAGNOSIS — Z794 Long term (current) use of insulin: Principal | ICD-10-CM

## 2021-01-12 DIAGNOSIS — R0902 Hypoxemia: Principal | ICD-10-CM

## 2021-01-12 DIAGNOSIS — E222 Syndrome of inappropriate secretion of antidiuretic hormone: Principal | ICD-10-CM

## 2021-01-12 DIAGNOSIS — L03114 Cellulitis of left upper limb: Principal | ICD-10-CM

## 2021-01-12 DIAGNOSIS — R339 Retention of urine, unspecified: Principal | ICD-10-CM

## 2021-01-12 DIAGNOSIS — E875 Hyperkalemia: Principal | ICD-10-CM

## 2021-01-12 DIAGNOSIS — T402X5A Adverse effect of other opioids, initial encounter: Principal | ICD-10-CM

## 2021-01-12 DIAGNOSIS — I252 Old myocardial infarction: Principal | ICD-10-CM

## 2021-01-12 DIAGNOSIS — D631 Anemia in chronic kidney disease: Principal | ICD-10-CM

## 2021-01-12 DIAGNOSIS — F319 Bipolar disorder, unspecified: Principal | ICD-10-CM

## 2021-01-12 DIAGNOSIS — I059 Rheumatic mitral valve disease, unspecified: Principal | ICD-10-CM

## 2021-01-12 DIAGNOSIS — N186 End stage renal disease: Principal | ICD-10-CM

## 2021-01-12 DIAGNOSIS — T82898A Other specified complication of vascular prosthetic devices, implants and grafts, initial encounter: Principal | ICD-10-CM

## 2021-01-12 DIAGNOSIS — I350 Nonrheumatic aortic (valve) stenosis: Principal | ICD-10-CM

## 2021-01-12 DIAGNOSIS — Z20822 Contact with and (suspected) exposure to covid-19: Principal | ICD-10-CM

## 2021-01-12 DIAGNOSIS — G928 Other toxic encephalopathy: Principal | ICD-10-CM

## 2021-01-12 DIAGNOSIS — Z90710 Acquired absence of both cervix and uterus: Principal | ICD-10-CM

## 2021-01-12 DIAGNOSIS — Z9049 Acquired absence of other specified parts of digestive tract: Principal | ICD-10-CM

## 2021-01-12 DIAGNOSIS — I2510 Atherosclerotic heart disease of native coronary artery without angina pectoris: Principal | ICD-10-CM

## 2021-01-12 DIAGNOSIS — I5032 Chronic diastolic (congestive) heart failure: Principal | ICD-10-CM

## 2021-01-12 DIAGNOSIS — M5136 Other intervertebral disc degeneration, lumbar region: Principal | ICD-10-CM

## 2021-01-12 DIAGNOSIS — Z7982 Long term (current) use of aspirin: Principal | ICD-10-CM

## 2021-01-12 DIAGNOSIS — Z87891 Personal history of nicotine dependence: Principal | ICD-10-CM

## 2021-01-12 DIAGNOSIS — E785 Hyperlipidemia, unspecified: Principal | ICD-10-CM

## 2021-01-12 DIAGNOSIS — E1022 Type 1 diabetes mellitus with diabetic chronic kidney disease: Principal | ICD-10-CM

## 2021-01-12 DIAGNOSIS — E039 Hypothyroidism, unspecified: Principal | ICD-10-CM

## 2021-01-12 DIAGNOSIS — Z9884 Bariatric surgery status: Principal | ICD-10-CM

## 2021-01-12 DIAGNOSIS — Z6834 Body mass index (BMI) 34.0-34.9, adult: Principal | ICD-10-CM

## 2021-01-12 DIAGNOSIS — E871 Hypo-osmolality and hyponatremia: Principal | ICD-10-CM

## 2021-01-12 DIAGNOSIS — G894 Chronic pain syndrome: Principal | ICD-10-CM

## 2021-01-12 DIAGNOSIS — Z955 Presence of coronary angioplasty implant and graft: Principal | ICD-10-CM

## 2021-01-12 DIAGNOSIS — Z79899 Other long term (current) drug therapy: Principal | ICD-10-CM

## 2021-01-12 DIAGNOSIS — M1990 Unspecified osteoarthritis, unspecified site: Principal | ICD-10-CM

## 2021-01-12 DIAGNOSIS — K219 Gastro-esophageal reflux disease without esophagitis: Principal | ICD-10-CM

## 2021-01-12 DIAGNOSIS — E8770 Fluid overload, unspecified: Principal | ICD-10-CM

## 2021-01-12 DIAGNOSIS — Z951 Presence of aortocoronary bypass graft: Principal | ICD-10-CM

## 2021-01-14 DIAGNOSIS — Z9884 Bariatric surgery status: Principal | ICD-10-CM

## 2021-01-14 DIAGNOSIS — K219 Gastro-esophageal reflux disease without esophagitis: Principal | ICD-10-CM

## 2021-01-14 DIAGNOSIS — D631 Anemia in chronic kidney disease: Principal | ICD-10-CM

## 2021-01-14 DIAGNOSIS — Z79899 Other long term (current) drug therapy: Principal | ICD-10-CM

## 2021-01-14 DIAGNOSIS — E785 Hyperlipidemia, unspecified: Principal | ICD-10-CM

## 2021-01-14 DIAGNOSIS — I2510 Atherosclerotic heart disease of native coronary artery without angina pectoris: Principal | ICD-10-CM

## 2021-01-14 DIAGNOSIS — E10649 Type 1 diabetes mellitus with hypoglycemia without coma: Principal | ICD-10-CM

## 2021-01-14 DIAGNOSIS — F319 Bipolar disorder, unspecified: Principal | ICD-10-CM

## 2021-01-14 DIAGNOSIS — Z955 Presence of coronary angioplasty implant and graft: Principal | ICD-10-CM

## 2021-01-14 DIAGNOSIS — F11121 Opioid abuse with intoxication delirium: Principal | ICD-10-CM

## 2021-01-14 DIAGNOSIS — L03114 Cellulitis of left upper limb: Principal | ICD-10-CM

## 2021-01-14 DIAGNOSIS — I059 Rheumatic mitral valve disease, unspecified: Principal | ICD-10-CM

## 2021-01-14 DIAGNOSIS — E039 Hypothyroidism, unspecified: Principal | ICD-10-CM

## 2021-01-14 DIAGNOSIS — I350 Nonrheumatic aortic (valve) stenosis: Principal | ICD-10-CM

## 2021-01-14 DIAGNOSIS — Z992 Dependence on renal dialysis: Principal | ICD-10-CM

## 2021-01-14 DIAGNOSIS — B182 Chronic viral hepatitis C: Principal | ICD-10-CM

## 2021-01-14 DIAGNOSIS — E871 Hypo-osmolality and hyponatremia: Principal | ICD-10-CM

## 2021-01-14 DIAGNOSIS — E222 Syndrome of inappropriate secretion of antidiuretic hormone: Principal | ICD-10-CM

## 2021-01-14 DIAGNOSIS — Z87891 Personal history of nicotine dependence: Principal | ICD-10-CM

## 2021-01-14 DIAGNOSIS — I132 Hypertensive heart and chronic kidney disease with heart failure and with stage 5 chronic kidney disease, or end stage renal disease: Principal | ICD-10-CM

## 2021-01-14 DIAGNOSIS — F419 Anxiety disorder, unspecified: Principal | ICD-10-CM

## 2021-01-14 DIAGNOSIS — Z6834 Body mass index (BMI) 34.0-34.9, adult: Principal | ICD-10-CM

## 2021-01-14 DIAGNOSIS — T402X5A Adverse effect of other opioids, initial encounter: Principal | ICD-10-CM

## 2021-01-14 DIAGNOSIS — Z90710 Acquired absence of both cervix and uterus: Principal | ICD-10-CM

## 2021-01-14 DIAGNOSIS — Z794 Long term (current) use of insulin: Principal | ICD-10-CM

## 2021-01-14 DIAGNOSIS — R0902 Hypoxemia: Principal | ICD-10-CM

## 2021-01-14 DIAGNOSIS — Z951 Presence of aortocoronary bypass graft: Principal | ICD-10-CM

## 2021-01-14 DIAGNOSIS — M1990 Unspecified osteoarthritis, unspecified site: Principal | ICD-10-CM

## 2021-01-14 DIAGNOSIS — E875 Hyperkalemia: Principal | ICD-10-CM

## 2021-01-14 DIAGNOSIS — E8770 Fluid overload, unspecified: Principal | ICD-10-CM

## 2021-01-14 DIAGNOSIS — I252 Old myocardial infarction: Principal | ICD-10-CM

## 2021-01-14 DIAGNOSIS — G894 Chronic pain syndrome: Principal | ICD-10-CM

## 2021-01-14 DIAGNOSIS — R339 Retention of urine, unspecified: Principal | ICD-10-CM

## 2021-01-14 DIAGNOSIS — Z7982 Long term (current) use of aspirin: Principal | ICD-10-CM

## 2021-01-14 DIAGNOSIS — Z20822 Contact with and (suspected) exposure to covid-19: Principal | ICD-10-CM

## 2021-01-14 DIAGNOSIS — E1022 Type 1 diabetes mellitus with diabetic chronic kidney disease: Principal | ICD-10-CM

## 2021-01-14 DIAGNOSIS — Z9049 Acquired absence of other specified parts of digestive tract: Principal | ICD-10-CM

## 2021-01-14 DIAGNOSIS — M5136 Other intervertebral disc degeneration, lumbar region: Principal | ICD-10-CM

## 2021-01-14 DIAGNOSIS — N186 End stage renal disease: Principal | ICD-10-CM

## 2021-01-14 DIAGNOSIS — G928 Other toxic encephalopathy: Principal | ICD-10-CM

## 2021-01-14 DIAGNOSIS — T82898A Other specified complication of vascular prosthetic devices, implants and grafts, initial encounter: Principal | ICD-10-CM

## 2021-01-14 DIAGNOSIS — I5032 Chronic diastolic (congestive) heart failure: Principal | ICD-10-CM

## 2021-01-15 DIAGNOSIS — Z20822 Contact with and (suspected) exposure to covid-19: Principal | ICD-10-CM

## 2021-01-15 DIAGNOSIS — Z9884 Bariatric surgery status: Principal | ICD-10-CM

## 2021-01-15 DIAGNOSIS — T402X5A Adverse effect of other opioids, initial encounter: Principal | ICD-10-CM

## 2021-01-15 DIAGNOSIS — Z955 Presence of coronary angioplasty implant and graft: Principal | ICD-10-CM

## 2021-01-15 DIAGNOSIS — Z87891 Personal history of nicotine dependence: Principal | ICD-10-CM

## 2021-01-15 DIAGNOSIS — B182 Chronic viral hepatitis C: Principal | ICD-10-CM

## 2021-01-15 DIAGNOSIS — I350 Nonrheumatic aortic (valve) stenosis: Principal | ICD-10-CM

## 2021-01-15 DIAGNOSIS — Z951 Presence of aortocoronary bypass graft: Principal | ICD-10-CM

## 2021-01-15 DIAGNOSIS — L03114 Cellulitis of left upper limb: Principal | ICD-10-CM

## 2021-01-15 DIAGNOSIS — I059 Rheumatic mitral valve disease, unspecified: Principal | ICD-10-CM

## 2021-01-15 DIAGNOSIS — I132 Hypertensive heart and chronic kidney disease with heart failure and with stage 5 chronic kidney disease, or end stage renal disease: Principal | ICD-10-CM

## 2021-01-15 DIAGNOSIS — D631 Anemia in chronic kidney disease: Principal | ICD-10-CM

## 2021-01-15 DIAGNOSIS — Z7982 Long term (current) use of aspirin: Principal | ICD-10-CM

## 2021-01-15 DIAGNOSIS — Z90710 Acquired absence of both cervix and uterus: Principal | ICD-10-CM

## 2021-01-15 DIAGNOSIS — E039 Hypothyroidism, unspecified: Principal | ICD-10-CM

## 2021-01-15 DIAGNOSIS — E222 Syndrome of inappropriate secretion of antidiuretic hormone: Principal | ICD-10-CM

## 2021-01-15 DIAGNOSIS — E8770 Fluid overload, unspecified: Principal | ICD-10-CM

## 2021-01-15 DIAGNOSIS — Z6834 Body mass index (BMI) 34.0-34.9, adult: Principal | ICD-10-CM

## 2021-01-15 DIAGNOSIS — G928 Other toxic encephalopathy: Principal | ICD-10-CM

## 2021-01-15 DIAGNOSIS — I5032 Chronic diastolic (congestive) heart failure: Principal | ICD-10-CM

## 2021-01-15 DIAGNOSIS — Z794 Long term (current) use of insulin: Principal | ICD-10-CM

## 2021-01-15 DIAGNOSIS — K219 Gastro-esophageal reflux disease without esophagitis: Principal | ICD-10-CM

## 2021-01-15 DIAGNOSIS — G894 Chronic pain syndrome: Principal | ICD-10-CM

## 2021-01-15 DIAGNOSIS — R0902 Hypoxemia: Principal | ICD-10-CM

## 2021-01-15 DIAGNOSIS — M5136 Other intervertebral disc degeneration, lumbar region: Principal | ICD-10-CM

## 2021-01-15 DIAGNOSIS — M1990 Unspecified osteoarthritis, unspecified site: Principal | ICD-10-CM

## 2021-01-15 DIAGNOSIS — F11121 Opioid abuse with intoxication delirium: Principal | ICD-10-CM

## 2021-01-15 DIAGNOSIS — I252 Old myocardial infarction: Principal | ICD-10-CM

## 2021-01-15 DIAGNOSIS — E1022 Type 1 diabetes mellitus with diabetic chronic kidney disease: Principal | ICD-10-CM

## 2021-01-15 DIAGNOSIS — R339 Retention of urine, unspecified: Principal | ICD-10-CM

## 2021-01-15 DIAGNOSIS — Z9049 Acquired absence of other specified parts of digestive tract: Principal | ICD-10-CM

## 2021-01-15 DIAGNOSIS — I2510 Atherosclerotic heart disease of native coronary artery without angina pectoris: Principal | ICD-10-CM

## 2021-01-15 DIAGNOSIS — E10649 Type 1 diabetes mellitus with hypoglycemia without coma: Principal | ICD-10-CM

## 2021-01-15 DIAGNOSIS — T82898A Other specified complication of vascular prosthetic devices, implants and grafts, initial encounter: Principal | ICD-10-CM

## 2021-01-15 DIAGNOSIS — N186 End stage renal disease: Principal | ICD-10-CM

## 2021-01-15 DIAGNOSIS — F319 Bipolar disorder, unspecified: Principal | ICD-10-CM

## 2021-01-15 DIAGNOSIS — Z79899 Other long term (current) drug therapy: Principal | ICD-10-CM

## 2021-01-15 DIAGNOSIS — E871 Hypo-osmolality and hyponatremia: Principal | ICD-10-CM

## 2021-01-15 DIAGNOSIS — E785 Hyperlipidemia, unspecified: Principal | ICD-10-CM

## 2021-01-15 DIAGNOSIS — E875 Hyperkalemia: Principal | ICD-10-CM

## 2021-01-15 DIAGNOSIS — Z992 Dependence on renal dialysis: Principal | ICD-10-CM

## 2021-01-15 DIAGNOSIS — F419 Anxiety disorder, unspecified: Principal | ICD-10-CM

## 2021-01-15 MED ORDER — LEVOTHYROXINE 25 MCG TABLET
ORAL_TABLET | Freq: Every day | ORAL | 2 refills | 30 days | Status: CP
Start: 2021-01-15 — End: 2021-04-15
  Filled 2021-01-15: qty 30, 30d supply, fill #0

## 2021-01-15 MED ORDER — QUETIAPINE 200 MG TABLET
ORAL_TABLET | Freq: Every evening | ORAL | 0 refills | 30 days | Status: CP
Start: 2021-01-15 — End: 2021-02-14
  Filled 2021-01-15: qty 30, 30d supply, fill #0

## 2021-01-15 MED ORDER — MULTIVIT,TX WITH IRON 27 MG-CALCIUM-FOLIC ACID 0.4 MG-MINERALS TABLET
ORAL_TABLET | Freq: Every day | ORAL | 3 refills | 60 days | Status: CP
Start: 2021-01-15 — End: ?

## 2021-01-15 MED ORDER — DICLOFENAC 1 % TOPICAL GEL
Freq: Four times a day (QID) | TOPICAL | 11 refills | 25 days | Status: CP | PRN
Start: 2021-01-15 — End: 2022-01-15
  Filled 2021-01-15: qty 200, 25d supply, fill #0

## 2021-01-15 MED ORDER — PREGABALIN 50 MG CAPSULE
ORAL_CAPSULE | Freq: Two times a day (BID) | ORAL | 0 refills | 30 days | Status: CP
Start: 2021-01-15 — End: 2021-02-14

## 2021-01-15 MED ORDER — NICOTINE 21 MG/24 HR DAILY TRANSDERMAL PATCH
MEDICATED_PATCH | Freq: Every day | TRANSDERMAL | 0 refills | 28 days | Status: CP
Start: 2021-01-15 — End: 2021-02-04
  Filled 2021-01-15: qty 28, 28d supply, fill #0

## 2021-01-15 MED ORDER — NICOTINE (POLACRILEX) 2 MG GUM
BUCCAL | 3 refills | 10 days | Status: CP | PRN
Start: 2021-01-15 — End: 2021-02-04
  Filled 2021-01-15: qty 110, 10d supply, fill #0

## 2021-01-15 MED ORDER — FLUTICASONE PROPIONATE 50 MCG/ACTUATION NASAL SPRAY,SUSPENSION
Freq: Two times a day (BID) | NASAL | 0 refills | 0 days
Start: 2021-01-15 — End: 2022-01-15

## 2021-01-15 MED ORDER — MELATONIN 3 MG TABLET
ORAL_TABLET | Freq: Every evening | ORAL | 11 refills | 60 days | Status: CP
Start: 2021-01-15 — End: ?
  Filled 2021-01-15: qty 60, 60d supply, fill #0

## 2021-01-15 MED ORDER — CLONAZEPAM 0.5 MG TABLET
ORAL_TABLET | Freq: Every day | ORAL | 0 refills | 30.00000 days | Status: CP | PRN
Start: 2021-01-15 — End: 2021-02-14
  Filled 2021-01-15: qty 15, 30d supply, fill #0

## 2021-01-15 MED ORDER — INSULIN GLARGINE (U-100) 100 UNIT/ML SUBCUTANEOUS SOLUTION
Freq: Every evening | SUBCUTANEOUS | 0 refills | 30 days | Status: CP
Start: 2021-01-15 — End: 2021-02-14

## 2021-01-15 MED ORDER — SEVELAMER CARBONATE 800 MG TABLET
ORAL_TABLET | Freq: Three times a day (TID) | ORAL | 11 refills | 30 days | Status: CP
Start: 2021-01-15 — End: 2022-01-15
  Filled 2021-01-15: qty 90, 30d supply, fill #0

## 2021-01-15 MED ORDER — ACETAMINOPHEN 500 MG TABLET
ORAL_TABLET | Freq: Three times a day (TID) | ORAL | 0 refills | 30 days | Status: CP | PRN
Start: 2021-01-15 — End: 2021-02-14
  Filled 2021-01-15: qty 180, 30d supply, fill #0

## 2021-01-15 MED ORDER — NICOTINE (POLACRILEX) 4 MG BUCCAL LOZENGE
BUCCAL | 3 refills | 3.00000 days | Status: CP | PRN
Start: 2021-01-15 — End: 2021-02-04
  Filled 2021-01-15: qty 72, 3d supply, fill #0

## 2021-01-22 MED ORDER — FLUTICASONE PROPIONATE 50 MCG/ACTUATION NASAL SPRAY,SUSPENSION
0 days
Start: 2021-01-22 — End: ?

## 2021-01-28 ENCOUNTER — Ambulatory Visit
Admit: 2021-01-28 | Discharge: 2021-01-29 | Payer: MEDICARE | Attending: Student in an Organized Health Care Education/Training Program | Primary: Student in an Organized Health Care Education/Training Program

## 2021-01-28 DIAGNOSIS — T82898D Other specified complication of vascular prosthetic devices, implants and grafts, subsequent encounter: Principal | ICD-10-CM

## 2021-01-28 DIAGNOSIS — F17200 Nicotine dependence, unspecified, uncomplicated: Principal | ICD-10-CM

## 2021-01-28 DIAGNOSIS — W19XXXA Unspecified fall, initial encounter: Principal | ICD-10-CM

## 2021-01-28 DIAGNOSIS — E119 Type 2 diabetes mellitus without complications: Principal | ICD-10-CM

## 2021-01-28 DIAGNOSIS — B182 Chronic viral hepatitis C: Principal | ICD-10-CM

## 2021-01-28 DIAGNOSIS — R39198 Other difficulties with micturition: Principal | ICD-10-CM

## 2021-01-28 DIAGNOSIS — Z23 Encounter for immunization: Principal | ICD-10-CM

## 2021-01-28 DIAGNOSIS — Z09 Encounter for follow-up examination after completed treatment for conditions other than malignant neoplasm: Principal | ICD-10-CM

## 2021-02-02 ENCOUNTER — Telehealth
Admit: 2021-02-02 | Discharge: 2021-02-03 | Payer: MEDICARE | Attending: Psychiatric/Mental Health | Primary: Psychiatric/Mental Health

## 2021-02-02 DIAGNOSIS — F319 Bipolar disorder, unspecified: Principal | ICD-10-CM

## 2021-02-02 DIAGNOSIS — T82898D Other specified complication of vascular prosthetic devices, implants and grafts, subsequent encounter: Principal | ICD-10-CM

## 2021-02-02 DIAGNOSIS — E1329 Other specified diabetes mellitus with other diabetic kidney complication: Principal | ICD-10-CM

## 2021-02-02 DIAGNOSIS — F1910 Other psychoactive substance abuse, uncomplicated: Principal | ICD-10-CM

## 2021-02-02 MED ORDER — LAMOTRIGINE 100 MG TABLET
ORAL_TABLET | Freq: Every evening | ORAL | 0 refills | 90 days | Status: CP
Start: 2021-02-02 — End: 2021-05-03

## 2021-02-02 MED ORDER — PEN NEEDLE, DIABETIC 29 GAUGE X 1/2" (12 MM)
11 refills | 0 days | Status: CP
Start: 2021-02-02 — End: 2022-02-02

## 2021-02-03 ENCOUNTER — Ambulatory Visit
Admit: 2021-02-03 | Discharge: 2021-02-05 | Disposition: A | Discharge status: Home / Self Care | Payer: MEDICARE | Admitting: Student in an Organized Health Care Education/Training Program

## 2021-02-03 ENCOUNTER — Encounter
Admit: 2021-02-03 | Discharge: 2021-02-05 | Disposition: A | Discharge status: Home / Self Care | Payer: MEDICARE | Admitting: Student in an Organized Health Care Education/Training Program

## 2021-02-05 DIAGNOSIS — N186 End stage renal disease: Principal | ICD-10-CM

## 2021-02-05 MED ORDER — ASPIRIN 81 MG TABLET,DELAYED RELEASE
ORAL_TABLET | Freq: Every day | ORAL | 0 refills | 90.00000 days | Status: CP
Start: 2021-02-05 — End: 2021-05-06
  Filled 2021-02-05: qty 90, 90d supply, fill #0

## 2021-02-05 MED ORDER — OXYCODONE 5 MG TABLET
ORAL_TABLET | ORAL | 0 refills | 3.00000 days | Status: CP | PRN
Start: 2021-02-05 — End: 2021-02-10
  Filled 2021-02-05: qty 15, 3d supply, fill #0

## 2021-02-11 ENCOUNTER — Ambulatory Visit: Admit: 2021-02-11 | Discharge: 2021-02-12 | Payer: MEDICARE

## 2021-02-11 MED ORDER — NIFEDIPINE ER 60 MG TABLET,EXTENDED RELEASE 24 HR
ORAL_TABLET | Freq: Every day | ORAL | 11 refills | 30.00000 days | Status: CP
Start: 2021-02-11 — End: ?

## 2021-02-11 MED ORDER — PREGABALIN 100 MG CAPSULE
ORAL_CAPSULE | Freq: Every evening | ORAL | 2 refills | 30 days | Status: CP
Start: 2021-02-11 — End: 2022-02-11

## 2021-02-11 MED ORDER — ATORVASTATIN 80 MG TABLET
ORAL_TABLET | Freq: Every day | ORAL | 11 refills | 30.00000 days
Start: 2021-02-11 — End: ?

## 2021-02-11 MED ORDER — INSULIN GLARGINE (U-100) 100 UNIT/ML SUBCUTANEOUS SOLUTION
Freq: Every evening | SUBCUTANEOUS | 2 refills | 125 days | Status: CP
Start: 2021-02-11 — End: ?

## 2021-02-15 ENCOUNTER — Ambulatory Visit: Admit: 2021-02-15 | Discharge: 2021-02-16 | Payer: MEDICARE

## 2021-02-18 ENCOUNTER — Ambulatory Visit
Admit: 2021-02-18 | Discharge: 2021-02-19 | Payer: MEDICARE | Attending: Student in an Organized Health Care Education/Training Program | Primary: Student in an Organized Health Care Education/Training Program

## 2021-02-18 MED ORDER — LEVOTHYROXINE 25 MCG TABLET
ORAL_TABLET | Freq: Every day | ORAL | 2 refills | 30 days | Status: CP
Start: 2021-02-18 — End: 2021-05-19

## 2021-02-19 ENCOUNTER — Ambulatory Visit
Admit: 2021-02-19 | Payer: MEDICARE | Attending: Public Health & General Preventive Medicine | Primary: Public Health & General Preventive Medicine

## 2021-02-23 ENCOUNTER — Ambulatory Visit
Admit: 2021-02-23 | Discharge: 2021-02-23 | Payer: MEDICARE | Attending: Student in an Organized Health Care Education/Training Program | Primary: Student in an Organized Health Care Education/Training Program

## 2021-02-23 ENCOUNTER — Ambulatory Visit: Admit: 2021-02-23 | Discharge: 2021-02-23 | Payer: MEDICARE

## 2021-02-23 ENCOUNTER — Telehealth: Admit: 2021-02-23 | Discharge: 2021-02-23 | Payer: MEDICARE

## 2021-02-23 DIAGNOSIS — N186 End stage renal disease: Principal | ICD-10-CM

## 2021-02-23 DIAGNOSIS — B182 Chronic viral hepatitis C: Principal | ICD-10-CM

## 2021-02-23 DIAGNOSIS — Z992 Dependence on renal dialysis: Principal | ICD-10-CM

## 2021-02-23 DIAGNOSIS — I2510 Atherosclerotic heart disease of native coronary artery without angina pectoris: Principal | ICD-10-CM

## 2021-02-23 DIAGNOSIS — E1022 Type 1 diabetes mellitus with diabetic chronic kidney disease: Principal | ICD-10-CM

## 2021-02-23 DIAGNOSIS — F17200 Nicotine dependence, unspecified, uncomplicated: Principal | ICD-10-CM

## 2021-02-23 MED ORDER — SOFOSBUVIR 400 MG-VELPATASVIR 100 MG TABLET
ORAL_TABLET | Freq: Every day | ORAL | 2 refills | 28.00000 days | Status: CP
Start: 2021-02-23 — End: ?
  Filled 2021-03-01: qty 28, 28d supply, fill #0

## 2021-02-25 ENCOUNTER — Telehealth
Admit: 2021-02-25 | Discharge: 2021-02-26 | Payer: MEDICARE | Attending: Student in an Organized Health Care Education/Training Program | Primary: Student in an Organized Health Care Education/Training Program

## 2021-02-25 DIAGNOSIS — I10 Essential (primary) hypertension: Principal | ICD-10-CM

## 2021-02-25 DIAGNOSIS — F3175 Bipolar disorder, in partial remission, most recent episode depressed: Principal | ICD-10-CM

## 2021-02-25 DIAGNOSIS — E785 Hyperlipidemia, unspecified: Principal | ICD-10-CM

## 2021-02-25 DIAGNOSIS — E1122 Type 2 diabetes mellitus with diabetic chronic kidney disease: Principal | ICD-10-CM

## 2021-02-25 DIAGNOSIS — Z992 Dependence on renal dialysis: Principal | ICD-10-CM

## 2021-02-25 DIAGNOSIS — Z9861 Coronary angioplasty status: Principal | ICD-10-CM

## 2021-02-25 DIAGNOSIS — E1022 Type 1 diabetes mellitus with diabetic chronic kidney disease: Principal | ICD-10-CM

## 2021-02-25 DIAGNOSIS — Z85820 Personal history of malignant melanoma of skin: Principal | ICD-10-CM

## 2021-02-25 DIAGNOSIS — T82898D Other specified complication of vascular prosthetic devices, implants and grafts, subsequent encounter: Principal | ICD-10-CM

## 2021-02-25 DIAGNOSIS — Z9884 Bariatric surgery status: Principal | ICD-10-CM

## 2021-02-25 DIAGNOSIS — F1910 Other psychoactive substance abuse, uncomplicated: Principal | ICD-10-CM

## 2021-02-25 DIAGNOSIS — Z951 Presence of aortocoronary bypass graft: Principal | ICD-10-CM

## 2021-02-25 DIAGNOSIS — N186 End stage renal disease: Principal | ICD-10-CM

## 2021-02-25 DIAGNOSIS — F331 Major depressive disorder, recurrent, moderate: Principal | ICD-10-CM

## 2021-02-25 DIAGNOSIS — F332 Major depressive disorder, recurrent severe without psychotic features: Principal | ICD-10-CM

## 2021-02-25 DIAGNOSIS — F17200 Nicotine dependence, unspecified, uncomplicated: Principal | ICD-10-CM

## 2021-02-25 DIAGNOSIS — Z794 Long term (current) use of insulin: Principal | ICD-10-CM

## 2021-02-25 DIAGNOSIS — B182 Chronic viral hepatitis C: Principal | ICD-10-CM

## 2021-02-25 MED ORDER — SOFOSBUVIR 400 MG-VELPATASVIR 100 MG TABLET
ORAL_TABLET | Freq: Every day | ORAL | 2 refills | 28 days
Start: 2021-02-25 — End: 2021-02-25

## 2021-02-25 MED ORDER — BUPROPION HCL XL 150 MG 24 HR TABLET, EXTENDED RELEASE
ORAL_TABLET | Freq: Every morning | ORAL | 1 refills | 30 days | Status: CP
Start: 2021-02-25 — End: 2021-03-28

## 2021-02-25 MED ORDER — NIFEDIPINE ER 90 MG TABLET,EXTENDED RELEASE 24 HR
ORAL_TABLET | Freq: Every day | ORAL | 5 refills | 30 days | Status: CP
Start: 2021-02-25 — End: ?

## 2021-02-25 MED ORDER — QUETIAPINE 300 MG TABLET
ORAL_TABLET | Freq: Every evening | ORAL | 4 refills | 90.00000 days | Status: CP
Start: 2021-02-25 — End: 2022-03-27

## 2021-02-25 MED ORDER — PEN NEEDLE, DIABETIC 32 GAUGE X 5/32" (4 MM)
Freq: Three times a day (TID) | 3 refills | 1 days | Status: CP
Start: 2021-02-25 — End: ?

## 2021-02-25 MED ORDER — INSULIN GLARGINE (U-100) 100 UNIT/ML SUBCUTANEOUS SOLUTION
Freq: Every evening | SUBCUTANEOUS | 2 refills | 107 days
Start: 2021-02-25 — End: ?

## 2021-02-25 MED ORDER — BUMETANIDE 2 MG TABLET
ORAL_TABLET | Freq: Two times a day (BID) | ORAL | 11 refills | 30 days | Status: CP
Start: 2021-02-25 — End: 2022-02-25

## 2021-02-25 NOTE — Unmapped (Signed)
[facility] Specialty Medication Onboarding    Specialty Medication: Architectural technologist  Prior Authorization: Approved   Financial Assistance: No - copay  <$25  Final Copay/Day Supply: $3 / 5  - unable to test claim for full 28 days (Cost exceeds max)    Insurance Restrictions: None     Notes to Pharmacist:     The triage team has completed the benefits investigation and has determined that the patient is able to fill this medication at [facility]. Please contact the patient to complete the onboarding or follow up with the prescribing physician as needed.

## 2021-02-26 ENCOUNTER — Telehealth
Admit: 2021-02-26 | Discharge: 2021-02-27 | Payer: MEDICARE | Attending: Public Health & General Preventive Medicine | Primary: Public Health & General Preventive Medicine

## 2021-02-26 DIAGNOSIS — E785 Hyperlipidemia, unspecified: Principal | ICD-10-CM

## 2021-02-26 DIAGNOSIS — I2510 Atherosclerotic heart disease of native coronary artery without angina pectoris: Principal | ICD-10-CM

## 2021-02-26 MED ORDER — ATORVASTATIN 40 MG TABLET
ORAL_TABLET | Freq: Every day | ORAL | 11 refills | 30 days | Status: CP
Start: 2021-02-26 — End: 2022-02-26
  Filled 2021-03-01: qty 30, 30d supply, fill #0

## 2021-02-26 MED ORDER — ATORVASTATIN 80 MG TABLET
ORAL_TABLET | Freq: Every day | ORAL | 11 refills | 60 days | Status: CP
Start: 2021-02-26 — End: 2021-02-26

## 2021-02-26 NOTE — Unmapped (Addendum)
[facility]   Patient Onboarding/Medication Counseling    Alexandra Villarreal is a 55 y.o. female with Hepatitis C who I am counseling today on initiation of therapy.  I am speaking to the patient.    Was a Nurse, learning disability used for this call? No    Verified patient's date of birth / HIPAA.    Specialty medication(s) to be sent: Infectious Disease: Epclusa      Non-specialty medications/supplies to be sent: atorvastatin 40mg       Medications not needed at this time: n/a         Epclusa (sofosbuvir and velpatasvir) 400mg /100mg       Medication & Administration     Dosage: Take one tablet by mouth daily for 12 weeks. Since her dialysis is in the morning, I told her to take the Epclusa in the evening.    Administration:  ??? Take with or without food.  ??? Antacids: Separate the administration of Epclusa and antacids by at least 4 hours.  ??? H2 Receptor Antagonist: Administer H2 receptor antagonist doses less than or comparable to famotidine 40 mg twice daily simultaneously or 12 hours apart from [place].   ??? Proton Pump Inhibitor (PPI): Epclusa should be administered with food and taken 4 hours before omeprazole max dose of 20mg .      Adherence/Missed dose instructions:  ??? Take missed dose as soon as you remember. If it is close to the time of your next dose, skip the missed dose and resume with your next scheduled dose.  ??? Do not take extra doses or 2 doses at the same time.  ??? Avoid missing doses as it can result in development of resistance.    Goals of Therapy     The goal of therapy is to cure the patient of Hepatitis C. Patients who have undetectable HCV RNA in the serum, as assessed by a sensitive polymerase chain reaction (PCR) assay, ?12 weeks after treatment completion are deemed to have achieved SVR (cure). (www.hcvguidelines.org)    Side Effects & Monitoring Parameters     Common Side Effects:   ??? Headache  ??? Fatigue     To help minimize some of the side effects: (http://www.velazquez.com/)  ??? Stay hydrated by drinking plenty of water and limiting caffeinated beverages such as coffee, tea and soda.  ??? Do your hardest chores when you have the most energy.  ??? Take short naps of 20 minutes or less that are and not too close to bedtime.    The following side effects should be reported to the provider:  ??? Signs of liver problems like dark urine, feeling excessively tired, lack of hunger, upset stomach or stomach pain, light-colored stools, throwing up, or yellow skin/eyes.  ??? Signs of an allergic reaction, such as rash; hives; itching; red, swollen, blistered, or peeling skin with or without fever. If you have wheezing; tightness in the chest or throat; trouble breathing, swallowing, or talking; unusual hoarseness; or swelling of the mouth, face, lips, tongue, or throat, call 911 or go to the closest emergency department (ED).     Monitoring Parameters: (AASLD-IDSA Guidelines)    Within 6 months prior to starting treatment:  ??? Complete blood count (CBC).  ??? International normalized ratio (INR) if clinically indicated.   ??? Hepatic function panel: albumin, total and direct bilirubin, ALT, AST, and Alkaline Phosphatase levels.  ??? Calculated glomerular filtration rate (eGFR).  ??? Pregnancy test within 1 month of starting treatment for females of childbearing age  Any  time prior to starting treatment:  ??? Test for HCV genotype.  ??? Assess for hepatic fibrosis.  ??? Quantitative HCV RNA (HCV viral load).  ??? Assess for active HBV coinfection: HBV surface antigen (HBsAg); If HBsAg positive, then should be assessed for whether HBV DNA level meets AASLD criteria for HBV treatment.  ??? Assess for evidence of prior HBV infection and immunity: HBV core antibody (anti-HBc) and HBV surface antibody (anti-HBS).   ??? Assess for HIV coinfection.  ??? Test for the presence of resistance-associated substitutions (RASs) prior to starting treatment if clinically indicated.    Monitoring during treatment:   ??? Diabetics should monitor for signs of hypoglycemia.  ??? Patients on warfarin should monitor for changes in INR.  ??? Pregnancy test monthly in females of childbearing age  ??? For HBsAg positive patients not already on HBV therapy because baseline HBV DNA level does not meet treatment criteria:   o Initiate prophylactic HBV antiviral therapy OR  o Monitor HBV DNA levels monthly during and immediately after Epclusa therapy.    After treatment to document a cure:   ??? Quantitative HCV RNA12 weeks after completion of therapy.    Contraindications, Warnings, & Precautions     ??? Hepatitis B reactivation.  ??? Bradycardia with amiodarone coadministration: Coadministration not recommended. If unavoidable, patients should have inpatient cardiac monitoring for 48 hours after first Epclusa dose, followed by daily outpatient or self-monitoring of heart rate for at least the first 2 weeks of treatment. Due to the long half-life of amiodarone, cardiac monitoring is also recommended if amiodarone was discontinued just prior to beginning treatment.     Drug/Food Interactions     ??? Medication list reviewed in Epic. The patient was instructed to inform the care team before taking any new medications or supplements. Drug interactions as follows.  o Atorvastatin:  I told her to stop using atorvastatin 80mg  and to start using the atorvastatin 40mg  that we are sending with the Epclusa.  Epclusa can elevate levels of atorvastatin, she was told to self-report potential side effects of increased concentrations such as muscle pain.  o Diabetic medications: As the HCV clears the liver function will improve resulting in a possible need to lower her doses. I told her to continue to monitor her BG at home and report any symptoms of hypoglycemia such as shakiness and dizziness.  o She occasionally takes famotidine: I told her to either take at the same time as Epclusa or space 12 hours away and no more than 40 mg bid  o I told her that if she takes an antacid like Tums or Rolaids,  to make sure it is spaced 4 hours from her Epclusa  ??? Encourage minimizing alcohol intake.: does not drink alcohol  ??? Antacids: Separate the administration of Epclusa and antacids by at least 4 hours.  ??? Potential interactions: Clearance of HCV infection with direct acting antivirals may lead to changes in hepatic function, which may impact the safe and effective use of concomitant medications. Frequent monitoring of relevant laboratory parameters (e.g., International Normalized Ratio [INR] in patients taking warfarin, blood glucose levels in diabetic patients) or drug concentrations of concomitant medications such as cytochrome P450 substrates with a narrow therapeutic index (e.g. certain immunosuppressants) is recommended to ensure safe and effective use. Dose adjustments of concomitant medications may be necessary.    Storage, Handling Precautions, & Disposal     ??? Store this medication in the original container at room temperature.  ??? Store in a  dry place. Do not store in a bathroom.  ??? Keep all drugs in a safe place. Keep all drugs out of the reach of children and pets.  ??? Disposal: You should NOT have any Epclusa left over to throw away      Current Medications (including OTC/herbals), Comorbidities and Allergies     Current Outpatient Medications   Medication Sig Dispense Refill   ??? albuterol 2.5 mg /3 mL (0.083 %) nebulizer solution 3 mL.     ??? aspirin (ECOTRIN) 81 MG tablet Take 1 tablet (81 mg total) by mouth daily. 90 tablet 0   ??? atorvastatin (LIPITOR) 40 MG tablet Take 1 tablet (40 mg total) by mouth daily. For cholesterol 30 tablet 11   ??? bisacodyL (DULCOLAX, BISACODYL,) 10 mg suppository Continuous.     ??? bumetanide (BUMEX) 2 MG tablet Take 1 tablet (2 mg total) by mouth two (2) times a day. 60 tablet 11   ??? buPROPion (WELLBUTRIN XL) 150 MG 24 hr tablet Take 1 tablet (150 mg total) by mouth every morning. 30 tablet 1   ??? cyclobenzaprine (FLEXERIL) 5 MG tablet cyclobenzaprine 5 mg tablet   TAKE ONE TABLET BY MOUTH EVERY DAY AS NEEDED     ??? diclofenac sodium (VOLTAREN) 1 % gel Apply 2 g topically four (4) times a day as needed for pain. 200 g 11   ??? hydrOXYzine (ATARAX) 10 MG tablet Take 10 mg by mouth Three (3) times a day as needed for anxiety.     ??? insulin ASPART (NOVOLOG FLEXPEN U-100 INSULIN) 100 unit/mL (3 mL) injection pen Novolog Flexpen U-100 Insulin aspart 100 unit/mL (3 mL) subcutaneous   INJECT SUBCUTANEOUSLY TID PER SLIDING SCALE     ??? insulin glargine (LANTUS) 100 unit/mL injection Inject 0.14 mL (14 Units total) under the skin nightly. 15 mL 2   ??? lamoTRIgine (LAMICTAL) 100 MG tablet Take 1 tablet (100 mg total) by mouth nightly. 90 tablet 0   ??? levothyroxine (SYNTHROID) 25 MCG tablet Take 1 tablet (25 mcg total) by mouth daily. 30 tablet 2   ??? melatonin 3 mg Tab Take 1 tablet (3 mg total) by mouth every evening. 60 tablet 11   ??? metoclopramide (REGLAN) 10 MG tablet Take 10 mg by mouth daily as needed for GI discomfort.     ??? metoprolol tartrate (LOPRESSOR) 100 MG tablet metoprolol tartrate 100 mg tablet   TAKE 1/2 TABLET BY MOUTH DAILY     ??? NIFEdipine (PROCARDIA XL) 90 MG 24 hr tablet Take 1 tablet (90 mg total) by mouth daily. 30 tablet 5   ??? ondansetron (ZOFRAN) 4 MG tablet Take 4 mg by mouth every eight (8) hours as needed.     ??? pen needle, diabetic (PEN NEEDLE) 32 gauge x 5/32 (4 mm) Ndle 120 each by Miscellaneous route Three (3) times a day with a meal. 120 each 3   ??? pregabalin (LYRICA) 100 MG capsule Take 1 capsule (100 mg total) by mouth nightly. 30 capsule 2   ??? promethazine (PHENERGAN) 25 MG tablet promethazine 25 mg tablet   TAKE ONE TABLET BY MOUTH EVERY 6 HOURS AS NEEDED     ??? QUEtiapine (SEROQUEL) 300 MG tablet Take 1 tablet (300 mg total) by mouth nightly. 90 tablet 4   ??? sertraline (ZOLOFT) 50 MG tablet Take 150 mg by mouth daily.      ??? sevelamer (RENVELA) 800 mg tablet Take 1 tablet (800 mg total) by mouth Three (3) times a day  with a meal. 90 tablet 11   ??? sofosbuvir-velpatasvir (EPCLUSA) 400-100 mg tablet Take 1 tablet by mouth daily. 28 tablet 2   ??? sofosbuvir-velpatasvir (EPCLUSA) 400-100 mg tablet Take 1 tablet by mouth daily. 28 tablet 2     No current facility-administered medications for this visit.       Allergies   Allergen Reactions   ??? Nsaids (Non-Steroidal Anti-Inflammatory Drug) Other (See Comments)   ??? Penicillin G    ??? Morphine Itching       Patient Active Problem List   Diagnosis   ??? Type 2 diabetes mellitus with chronic kidney disease on chronic dialysis, with long-term current use of insulin (CMS-HCC)   ??? History of duodenal ulcer   ??? History of brisk upper GI bleed requiring massive transfusion - Oct 2016   ??? Chronic hepatitis C without hepatic coma (CMS-HCC)   ??? History of Roux-en-Y gastric bypass - 1998   ??? S/P cholecystectomy   ??? S/P total hysterectomy   ??? Tobacco use disorder   ??? Umbilical hernia without obstruction and without gangrene   ??? Narcotic dependence (CMS-HCC)   ??? Coronary artery disease involving native coronary artery of native heart without angina pectoris   ??? Bipolar disorder (manic depression) (CMS-HCC)   ??? Need for prophylactic vaccination and inoculation against viral hepatitis   ??? Vitamin D deficiency   ??? Ventral hernia without obstruction or gangrene   ??? Polysubstance abuse (CMS-HCC)   ??? ESRD (end stage renal disease) on dialysis (CMS-HCC)   ??? Hyperphosphatemia   ??? Mitral valve annular calcification   ??? Opioid abuse with intoxication delirium (CMS-HCC)   ??? Neuropathy   ??? 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 Postoperative hypothyroidism   ??? Status post four vessel coronary artery bypass   ??? H/O percutaneous transluminal coronary angioplasty   ??? Moderate episode of recurrent major depressive disorder (CMS-HCC)   ??? Dyslipidemia   ??? Primary hypertension       Reviewed and up to date in Epic.    Appropriateness of Therapy     Is medication and dose appropriate based on diagnosis? Yes    Prescription has been clinically reviewed: Yes    Baseline Quality of Life Assessment      How many days over the past month did your Hepatitis C  keep you from your normal activities? For example, brushing your teeth or getting up in the morning. 0    Financial Information     Medication Assistance provided: Prior Authorization    Anticipated copay of $3.00 reviewed with patient. Verified delivery address.    Delivery Information     Scheduled delivery date: 03/01/21    Expected start date: 03/01/21    Medication will be delivered via Same Day Courier to the prescription address in [facility].  This shipment will not require a signature.      Explained the services we provide at [facility] and that each month we would call to set up refills.  Stressed importance of returning phone calls so that we could ensure they receive their medications in time each month.  Informed patient that we should be setting up refills 7-10 days prior to when they will run out of medication.  A pharmacist will reach out to perform a clinical assessment periodically.  Informed patient that a welcome packet, containing information about our pharmacy and other support services, a Notice of Privacy Practices, and a drug information handout  will be sent.      Patient verbalized understanding of the above information as well as how to contact the pharmacy at [phone] option 4 with any questions/concerns.  The pharmacy is open Monday through Friday 8:30am-4:30pm.  A pharmacist is available 24/7 via pager to answer any clinical questions they may have.    Patient Specific Needs     - Does the patient have any physical, cognitive, or cultural barriers? No    - Patient prefers to have medications discussed with  Patient     - Is the patient or caregiver able to read and understand education materials at a high school level or above? Yes    - Patient's primary language is  English     - Is the patient high risk? No    - Does the patient require a Care Management Plan? No     - Does the patient require physician intervention or other additional services (i.e. nutrition, smoking cessation, social work)? No      Roderic Palau  [facility] Specialty Pharmacist

## 2021-02-26 NOTE — Unmapped (Signed)
Pre-call completed for VIR procedure.   Pt has prep instructions and understands them as well as pre-procedure diet restrictions.  Medication guidelines day of procedure reviewed.    NPO status, need for driver, arrival time/location reviewed.  All questions addressed and pt verbalizes understanding.    Covid screening questions reviewed.  Visitor information documented in pre-op checklist.

## 2021-02-26 NOTE — Unmapped (Signed)
[facility]      ASSESSMENT/PLAN:  {TIP [facility]- Clinical Documentation Specialist Recommendations-  No specialty comments available.   This text will self delete upon signing note:[id number]}       Diagmed***      Follow up ***    #Sleep  - seroquel   - hydrozizine   - No    #BP high  - 179/96   - Nifedipine 90mg   - Endorses headaches - usually right after dialysis, fatigue  - Needs bumex refill (last took it this morning)  -    SUBJECTIVE:  HPI: Alexandra Villarreal is a 55 y.o. female who presents to clinic today for the following issues:    - didn't have a ride to get to the appointment because appointment was too late in the afternoon. Need to be on the bus before 4:30pm   -     Flu shot:    Medical Hx:  Past Medical History:   Diagnosis Date   ??? Bipolar affective disorder (CMS-HCC)    ??? Chest pain 08/05/2016   ??? Chronic pain syndrome    ??? Coronary artery disease    ??? DDD (degenerative disc disease), lumbar    ??? Dehydration 09/07/2016   ??? Depression    ??? Diabetes mellitus type 1 (CMS-HCC)    ??? Dyslipidemia    ??? Elevated lactic acid level 08/18/2017   ??? Epigastric pain 08/05/2016   ??? Hepatitis C    ??? History of heart artery stent     coronary artery in 2014   ??? History of migraine headaches    ??? History of seizures    ??? Hyperglycemia 09/07/2016    Resume home insulin    ??? Hyperkalemia 01/15/2021   ??? Hypertension    ??? Hyponatremia 09/07/2016   ??? Jejunal ulcer 2016    with GI bleed   ??? Melanoma (CMS-HCC)     right arm   ??? Metabolic acidosis, nongap 09/07/2016   ??? MI (myocardial infarction) (CMS-HCC) 2010    stent placement   ??? Morbid obesity (CMS-HCC)     with gastric bypass   ??? Osteoarthritis    ??? Volume overload 01/07/2021         Problem list:  Problem List     ??? Type 1 diabetes mellitus with chronic kidney disease on chronic dialysis    ??? History of small intestine ulcer    ??? History of brisk upper GI bleed requiring massive transfusion - Oct 2016    ??? 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 recheck A1C on 04/2021  - insulin glargine (LANTUS) 100 unit/mL injection; Inject 0.14 mL (14 Units total) under the skin nightly.  Dispense: 15 mL; Refill: 2  - pen needle, diabetic (PEN NEEDLE) 32 gauge x 5/32 (4 mm) Ndle; 120 each by Miscellaneous route Three (3) times a day with a meal.  Dispense: 120 each; Refill: 3    4. Chronic hepatitis C without hepatic coma (CMS-HCC)  Patient seen by infectious diseases. Starting treatment for hepatitis c in setting of hx of iv drug use. Plan to start epclusa and decrease atorvastatin to 40mg  while on epclusa therapy 2/2 risk of increased statin toxicity and rhabdomyolysis.  - sofosbuvir-velpatasvir (EPCLUSA) 400-100 mg tablet; Take 1 tablet by mouth  daily.  Dispense: 28 tablet; Refill: 2    5. Tobacco use disorder  Would like to quit. Starting wellbutrin today. Prior hx of seizure but in setting of hypoglycemia, not seizure disorder.  - buPROPion (WELLBUTRIN XL) 150 MG 24 hr tablet; Take 1 tablet (150 mg total) by mouth every morning.  Dispense: 30 tablet; Refill: 1    6. Bipolar disorder, in partial remission, most recent episode depressed (CMS-HCC)  - QUEtiapine (SEROQUEL) 300 MG tablet; Take 1 tablet (300 mg total) by mouth nightly.  Dispense: 90 tablet; Refill: 4    7. ESRD (end stage renal disease) (CMS-HCC)  - bumetanide (BUMEX) 2 MG tablet; Take 1 tablet (2 mg total) by mouth two (2) times a day.  Dispense: 60 tablet; Refill: 11  - Ambulatory e-Consult to Nephrology    8. History of melanoma  HPI: reports new scalp lesion. Has had melanoma on arm excised at age 1.  A&P: Unable to do skin check 2/2 video visit. Will refer to [facility] skin clinic.  - Ambulatory referral to [facility]; Future    9. Moderate episode of recurrent major depressive disorder (CMS-HCC)  PHQ-9 PHQ-9 TOTAL SCORE   02/11/2021 15   09/22/2016 5 Patient reports lower mood and anhedonia [place] comorbidities.  Reports that she has her faith to help her cope with her recent hemodialysis and fistula complications.  After discussion with patient and comorbidity with tobacco use disorder, decided to start Wellbutrin to help with anhedonia and mood.  Plan to keep Seroquel on board given her history of significant psychiatric history, and would like to touch base with psychiatrist before discontinuing any of her psychiatric medications.    - hydrOXYzine (ATARAX) 10 MG tablet; Take 10 mg by mouth Three (3) times a day as needed for anxiety.  - QUEtiapine (SEROQUEL) 300 MG tablet; Take 1 tablet (300 mg total) by mouth nightly.  Dispense: 90 tablet; Refill: 4  - buPROPion (WELLBUTRIN XL) 150 MG 24 hr tablet; Take 1 tablet (150 mg total) by mouth every morning.  Dispense: 30 tablet; Refill: 1    12. Primary hypertension  HPI:  - 179/96   - Nifedipine 90mg   - Endorses headaches - usually right after dialysis, fatigue  - Needs bumex refill (last took it this morning)  A&P: pt previously on amlodipine but no longer taking, possible due to HD. Given ESRD on HD and hx of electrolyte abnormalities (on sevelamer), will consult nephrology for recommendations on additional bp medication for better control. Will increase nifedipine to max dose 90 today and follow-up next week. Provided patient with ED precautions for htn emergency and urgency  - NIFEdipine (PROCARDIA XL) 90 MG 24 hr tablet; Take 1 tablet (90 mg total) by mouth daily.  Dispense: 30 tablet; Refill: 5  - hydrOXYzine (ATARAX) 10 MG tablet; Take 10 mg by mouth Three (3) times a day as needed for anxiety.  - Ambulatory e-Consult to Nephrology    13. History of Roux-en-Y gastric bypass - 1998  - metoclopramide (REGLAN) 10 MG tablet; Take 10 mg by mouth daily as needed for GI discomfort.    Follow up:  - Pt would like to discuss sleep - has had difficulty staying asleep (wakes up to get water). Recommended melatonin and reassess at next visit.  - Cardiology referral for medication recommendation iso PCI and 4v CABG  - Atorvastatin reduce to 40mg  while taking eclupsa  -  Labs at next visit (TSH, lipids)  - Didn't have a ride to get to the appointment because appointment was too late in the afternoon. Need to be on the bus before 4:30pm. Consider enrollment in CCM     Followup:  No follow-ups on file.      The patient reports they are currently: at home. I spent 45 minutes on the real-time audio and video with the patient on the date of service. I spent an additional 90 minutes on pre- and post-visit activities on the date of service.     The patient was physically located in [place] or a state in which I am permitted to provide care. The patient and/or parent/guardian understood that s/he may incur co-pays and cost sharing, and agreed to the telemedicine visit. The visit was reasonable and appropriate under the circumstances given the patient's presentation at the time.    The patient and/or parent/guardian has been advised of the potential risks and limitations of this mode of treatment (including, but not limited to, the absence of in-person examination) and has agreed to be treated using telemedicine. The patient's/patient's family's questions regarding telemedicine have been answered.     If the visit was completed in an ambulatory setting, the patient and/or parent/guardian has also been advised to contact their provider???s office for worsening conditions, and seek emergency medical treatment and/or call 911 if the patient deems either necessary.       Objective:     General: Well appearing , in no acute distress  Skin: Color is normal. No rashes.  Psych: Appropriate affect, normal mood  Resp: Normal work of breathing, no retractions

## 2021-02-26 NOTE — Unmapped (Signed)
Faxed to [phone].

## 2021-02-26 NOTE — Unmapped (Signed)
Immediately after or during the visit, I reviewed with the resident the medical history and the resident???s findings on physical examination.  I discussed with the resident the patient???s diagnosis and concur with the treatment plan as documented in the resident note. - Traci Sermon, MD

## 2021-02-27 ENCOUNTER — Ambulatory Visit
Admit: 2021-02-27 | Discharge: 2021-02-28 | Disposition: A | Discharge status: Home / Self Care | Payer: MEDICARE | Attending: Emergency Medicine

## 2021-02-27 DIAGNOSIS — E871 Hypo-osmolality and hyponatremia: Principal | ICD-10-CM

## 2021-02-27 DIAGNOSIS — N186 End stage renal disease: Principal | ICD-10-CM

## 2021-02-27 DIAGNOSIS — Z992 Dependence on renal dialysis: Principal | ICD-10-CM

## 2021-02-27 LAB — CBC W/ AUTO DIFF
BASOPHILS ABSOLUTE COUNT: 0.1 10*9/L (ref 0.0–0.1)
BASOPHILS RELATIVE PERCENT: 1.1 %
EOSINOPHILS ABSOLUTE COUNT: 0.3 10*9/L (ref 0.0–0.5)
EOSINOPHILS RELATIVE PERCENT: 5.3 %
HEMATOCRIT: 33.6 % — ABNORMAL LOW (ref 34.0–44.0)
HEMOGLOBIN: 11.6 g/dL (ref 11.3–14.9)
LYMPHOCYTES ABSOLUTE COUNT: 1.1 10*9/L (ref 1.1–3.6)
LYMPHOCYTES RELATIVE PERCENT: 18.4 %
MEAN CORPUSCULAR HEMOGLOBIN CONC: 34.5 g/dL (ref 32.0–36.0)
MEAN CORPUSCULAR HEMOGLOBIN: 30.5 pg (ref 25.9–32.4)
MEAN CORPUSCULAR VOLUME: 88.4 fL (ref 77.6–95.7)
MEAN PLATELET VOLUME: 10.3 fL (ref 6.8–10.7)
MONOCYTES ABSOLUTE COUNT: 0.4 10*9/L (ref 0.3–0.8)
MONOCYTES RELATIVE PERCENT: 7.3 %
NEUTROPHILS ABSOLUTE COUNT: 4 10*9/L (ref 1.8–7.8)
NEUTROPHILS RELATIVE PERCENT: 67.9 %
PLATELET COUNT: 184 10*9/L (ref 150–450)
RED BLOOD CELL COUNT: 3.8 10*12/L — ABNORMAL LOW (ref 3.95–5.13)
RED CELL DISTRIBUTION WIDTH: 16.3 % — ABNORMAL HIGH (ref 12.2–15.2)
WBC ADJUSTED: 5.9 10*9/L (ref 3.6–11.2)

## 2021-02-27 LAB — BASIC METABOLIC PANEL
ANION GAP: 9 mmol/L (ref 5–14)
BLOOD UREA NITROGEN: 33 mg/dL — ABNORMAL HIGH (ref 9–23)
BUN / CREAT RATIO: 12
CALCIUM: 8.4 mg/dL — ABNORMAL LOW (ref 8.7–10.4)
CHLORIDE: 96 mmol/L — ABNORMAL LOW (ref 98–107)
CO2: 23 mmol/L (ref 20.0–31.0)
CREATININE: 2.71 mg/dL — ABNORMAL HIGH
EGFR CKD-EPI AA FEMALE: 22 mL/min/{1.73_m2} — ABNORMAL LOW (ref >=60)
EGFR CKD-EPI NON-AA FEMALE: 19 mL/min/{1.73_m2} — ABNORMAL LOW (ref >=60)
GLUCOSE RANDOM: 163 mg/dL (ref 70–179)
POTASSIUM: 4.8 mmol/L (ref 3.4–4.8)
SODIUM: 128 mmol/L — ABNORMAL LOW (ref 135–145)

## 2021-02-27 LAB — HIGH SENSITIVITY TROPONIN I - 2H/6H SERIAL
HIGH SENSITIVITY TROPONIN - DELTA (0-2H): 3 ng/L (ref <=7)
HIGH-SENSITIVITY TROPONIN I - 2 HOUR: 12 ng/L (ref <=34)

## 2021-02-27 LAB — SODIUM: SODIUM: 131 mmol/L — ABNORMAL LOW (ref 135–145)

## 2021-02-27 LAB — HIGH SENSITIVITY TROPONIN I - SINGLE: HIGH SENSITIVITY TROPONIN I: 9 ng/L (ref <=34)

## 2021-02-27 MED ADMIN — acetaminophen (TYLENOL) tablet 650 mg: 650 mg | ORAL | @ 21:00:00 | Stop: 2021-02-27

## 2021-02-27 NOTE — Unmapped (Signed)
[facility]  Emergency Department Provider Note        ED Clinical Impression      Final diagnoses:   CKD (chronic kidney disease) stage V requiring chronic dialysis (CMS-HCC) (Primary)   Hyponatremia           Impression, ED Course, Assessment and Plan      Impression:   Alexandra Villarreal is a 55 y.o. female with PMH significant for ESRD on HD MWF via left brachial basilic fistula (05/2020 in [place], revised with RUDI and harvest of right GSV 01/2021), PVCs, HTN, T2DM on long term insulin, bipolar, GERD, tobacco use, CAD s/p PCI in 2014 and CABG in 2020, hypothyroidism, remote IVDU,and chronic Hep C (on Epclusa) who presents today after missing dialysis yesterday and was told by her clinic this morning to present to her local ED. She denies any new complaints. See full HPI below.    VSS in triage, nontoxic in appearance.  She is well appearing, sitting comfortably, pleasant and conversational. Cardiopulmonary exam is benign.  Abdomen soft and nondistended throughout. No significant LE edema. Good thrill at fistula site without any active bleeding.     Will obtain EKG, CBC, CMP, CXR, and trop. I am reassured by her normal vital signs and exam findings, pt not endorsing any symptoms that is different from her baseline. Will contact pt's dialysis clinic    4:00 PM  EKG with no significant change when compared with EKG of 12/2020. No signs of ischemia.   CBC, CMP consistent with baseline.     I contacted pt's dialysis clinic, [facility] in [place]. States they had no chairs available today and they are closed tomorrow. If CXR wnl, will likely dc home with pt to resume her dialysis session Monday.     4:40 PM  Nephrology is aware of pt and confirmed a dialysis spot here today. They request we cancel CXR. Plan to dc after dialysis. Pt is comfortable with this plan.    5:13 PM  Care of pt signed out to APP Alexandra Villarreal at this time who is aware of pt's case and further plan of care. Plan to dc after dialysis session.     Additional Medical Decision Making     I have reviewed the vital signs and the nursing notes. Labs and radiology results that were available during my care of the patient were independently reviewed by me and considered in my medical decision making.     I directly visualized and independently interpreted the EKG tracing.   I reviewed the patient's prior medical records (PCP).   I discussed the case with the nephrology consultant.          History        Chief Complaint  Medical Problem      HPI   Alexandra Villarreal is a 55 y.o. female with PMH significant for ESRD on HD MWF via left brachial basilic fistula (05/2020 in [place], revised with RUDI and harvest of right GSV 01/2021), HTN, PVCs, T2DM on long term insulin, bipolar disorder, GERD, tobacco use, CAD s/p PCI in 2014 and CABG in 2020, hypothyroidism, remote IVDU,and chronic Hep C (on Epclusa) who presents today after missing dialysis yesterday due to transportation falling through. She was told by her facility that they did not have another spot yesterday or today and to present to her local ED. She endorses mild DOE that is baseline for her, denies new SOB. Denies significant abdominal or LE swelling.  Endorses regular bowel movements and baseline 3 urination episodes daily. Denies headache, confusion, vision changes, weakness, or noticing any other focal neurologic deficit. Denies any concerns currently.        Past Medical History:   Diagnosis Date   ??? AKI (acute kidney injury) (CMS-HCC) 09/07/2016   ??? Bipolar affective disorder (CMS-HCC)    ??? Chest pain 08/05/2016   ??? Chronic pain syndrome    ??? Constipation 08/14/2016   ??? Coronary artery disease    ??? DDD (degenerative disc disease), lumbar    ??? Dehydration 09/07/2016   ??? Depression    ??? Diabetes mellitus type 1 (CMS-HCC)    ??? Dyslipidemia    ??? Elevated lactic acid level 08/18/2017   ??? Epigastric pain 08/05/2016   ??? Hepatitis C    ??? History of heart artery stent     coronary artery in 2014   ??? History of migraine headaches    ??? History of seizures    ??? Hyperglycemia 09/07/2016    Resume home insulin    ??? Hyperkalemia 01/15/2021   ??? Hypertension    ??? Hyponatremia 09/07/2016   ??? Jejunal ulcer 2016    with GI bleed   ??? Melanoma (CMS-HCC)     right arm   ??? Metabolic acidosis, nongap 09/07/2016   ??? MI (myocardial infarction) (CMS-HCC) 2010    stent placement   ??? Morbid obesity (CMS-HCC)     with gastric bypass   ??? Need for prophylactic vaccination and inoculation against viral hepatitis 11/27/2013   ??? Osteoarthritis    ??? Urinary retention 01/16/2021   ??? Volume overload 01/07/2021       Patient Active Problem List   Diagnosis   ??? Type 2 diabetes mellitus with chronic kidney disease on chronic dialysis, with long-term current use of insulin (CMS-HCC)   ??? History of duodenal ulcer   ??? History of brisk upper GI bleed requiring massive transfusion - Oct 2016   ??? Chronic hepatitis C without hepatic coma (CMS-HCC)   ??? History of Roux-en-Y gastric bypass - 1998   ??? S/P cholecystectomy   ??? S/P total hysterectomy   ??? Tobacco use disorder   ??? Umbilical hernia without obstruction and without gangrene   ??? Narcotic dependence (CMS-HCC)   ??? Coronary artery disease involving native coronary artery of native heart without angina pectoris   ??? Bipolar disorder (manic depression) (CMS-HCC)   ??? Vitamin D deficiency   ??? Ventral hernia without obstruction or gangrene   ??? Polysubstance abuse (CMS-HCC)   ??? ESRD (end stage renal disease) on dialysis (CMS-HCC)   ??? Hyperphosphatemia   ??? Mitral valve annular calcification   ??? Opioid abuse with intoxication delirium (CMS-HCC)   ??? Neuropathy   ??? 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 Postoperative hypothyroidism   ??? Status post four vessel coronary artery bypass   ??? H/O percutaneous transluminal coronary angioplasty   ??? Moderate episode of recurrent major depressive disorder (CMS-HCC)   ??? Dyslipidemia   ??? Primary hypertension   ??? History of intussusception       Past Surgical History: Procedure Laterality Date   ??? CHOLECYSTECTOMY      due to gallstones    ??? COLON SURGERY     ??? COLONOSCOPY     ??? COMBINED ABDOMINOPLASTY AND LIPOSUCTION     ??? CORONARY STENT PLACEMENT     ??? DIALYSIS GRAFT FISTULA STUDY [facility] HISTORICAL RESULT)     ??? EXPLORATORY LAPAROTOMY  for intussusception   ??? HYSTERECTOMY     ??? LUMBAR DISC SURGERY     ??? PR INCIS/DRAIN FOREARM DEEP ABSCESS Left 08/21/2017    Procedure: I&D  FOREARM/WRIST; DEEP ABSCESS/HEMATOMA;  Surgeon: Garnett Farm, MD;  Location: MAIN OR [facility];  Service: Ortho Hand   ??? PR LIGATE/STRIP LONG SAPH VEIN BELW SEP-FEM JUNC Right 02/04/2021    Procedure: LIGATION, DIVISION, & STRIPPING, LONG(GREATER) SAPHENOUS VEINS; SAPHENOFEMORAL JUNCTION TO KNEE OR BELOW;  Surgeon: Deneise Lever, MD;  Location: MAIN [facility];  Service: Vascular   ??? PR NEGATIVE PRESSURE WOUND THERAPY DME </= 50 SQ CM Left 09/13/2017    Procedure: Neg Press Wound Tx (Vac Assist) Incl Topicals, Per Session, Tsa Less Than/= 50 Cm Squared;  Surgeon: Garnett Farm, MD;  Location: MAIN OR [facility];  Service: Ortho Hand   ??? PR REVISE AV FISTULA,W/O THROMBECTOMY Left 02/04/2021    Procedure: REVISON, OPEN, ARTERIOVENOUS FISTULA; WITHOUT THROMBECTOMY, AUTOGENOUS OR NONAUTOGENOUS DIALYSIS GRAFT;  Surgeon: Deneise Lever, MD;  Location: MAIN OR [facility];  Service: Vascular   ??? PR REVISE MEDIAN N/CARPAL TUNNEL SURG Left 08/21/2017    Procedure: Neuroplasty And/Or Transposition; Median Nerve At Carpal Tunnel;  Surgeon: Garnett Farm, MD;  Location: MAIN OR [facility];  Service: Ortho Hand   ??? PR REVISE ULNAR NERVE AT WRIST Left 08/21/2017    Procedure: Neuroplasty And/Or Transposition; Ulnar Nerve At Wrist;  Surgeon: Garnett Farm, MD;  Location: MAIN OR [facility];  Service: Ortho Hand   ??? PR SECD CLOS SURG WND EXTEN/COMPLIC Left 09/13/2017    Procedure: Secondary Closure Of Surgical Wound Or Dehiscence, Extensive Or Complicated;  Surgeon: Garnett Farm, MD;  Location: MAIN OR [facility];  Service: Ortho Hand   ??? PR SPLIT GRFT,HEAD,FAC,HAND,FEET <100 SQCM Left 09/13/2017    Procedure: SPLIT-THICKNESS AUTOGRAFT FACE, SCALP, EYELIDS, MOUTH, NECK, EARS, ORBITS, GENITALIA, HANDS, FEET; 1ST 100 SQ CM OR LESS, OR 1% CHILD;  Surgeon: Garnett Farm, MD;  Location: MAIN OR [place];  Service: Ortho Hand   ??? ROUX-EN-Y PROCEDURE  1998   ??? SKIN CANCER EXCISION Right     arm   ??? TONSILLECTOMY AND ADENOIDECTOMY     ??? TUBAL LIGATION Bilateral    ??? UMBILICAL HERNIA REPAIR     ??? UPPER GASTROINTESTINAL ENDOSCOPY         No current facility-administered medications for this encounter.    Current Outpatient Medications:   ???  albuterol 2.5 mg /3 mL (0.083 %) nebulizer solution, 3 mL. (Patient not taking: Reported on 03/02/2021), Disp: , Rfl:   ???  aspirin (ECOTRIN) 81 MG tablet, Take 1 tablet (81 mg total) by mouth daily., Disp: 90 tablet, Rfl: 0  ???  atorvastatin (LIPITOR) 40 MG tablet, Take 1 tablet (40 mg total) by mouth daily. For cholesterol, Disp: 30 tablet, Rfl: 11  ???  bisacodyL (DULCOLAX, BISACODYL,) 10 mg suppository, Continuous., Disp: , Rfl:   ???  bumetanide (BUMEX) 2 MG tablet, Take 1 tablet (2 mg total) by mouth two (2) times a day., Disp: 60 tablet, Rfl: 11  ???  buPROPion (WELLBUTRIN XL) 150 MG 24 hr tablet, Take 1 tablet (150 mg total) by mouth every morning., Disp: 30 tablet, Rfl: 1  ???  cyclobenzaprine (FLEXERIL) 5 MG tablet, cyclobenzaprine 5 mg tablet  TAKE ONE TABLET BY MOUTH EVERY DAY AS NEEDED, Disp: , Rfl:   ???  diclofenac sodium (VOLTAREN) 1 % gel, Apply 2 g topically four (4) times a day as needed  for pain., Disp: 200 g, Rfl: 11  ???  hydrOXYzine (ATARAX) 10 MG tablet, Take 1 tablet (10 mg total) by mouth Three (3) times a day as needed for anxiety., Disp: 30 tablet, Rfl: 2  ???  insulin ASPART (NOVOLOG FLEXPEN U-100 INSULIN) 100 unit/mL (3 mL) injection pen, Novolog Flexpen U-100 Insulin aspart 100 unit/mL (3 mL) subcutaneous  INJECT SUBCUTANEOUSLY TID PER SLIDING SCALE, Disp: , Rfl:   ???  insulin glargine (LANTUS) 100 unit/mL injection, Inject 0.14 mL (14 Units total) under the skin nightly., Disp: 15 mL, Rfl: 2  ???  lamoTRIgine (LAMICTAL) 100 MG tablet, Take 1 tablet (100 mg total) by mouth nightly., Disp: 90 tablet, Rfl: 0  ???  levothyroxine (SYNTHROID) 25 MCG tablet, Take 1 tablet (25 mcg total) by mouth daily., Disp: 30 tablet, Rfl: 2  ???  melatonin 3 mg Tab, Take 1 tablet (3 mg total) by mouth every evening., Disp: 60 tablet, Rfl: 11  ???  metoclopramide (REGLAN) 10 MG tablet, Take 10 mg by mouth daily as needed for GI discomfort., Disp: , Rfl:   ???  metoprolol tartrate (LOPRESSOR) 100 MG tablet, metoprolol tartrate 100 mg tablet  TAKE 1/2 TABLET BY MOUTH DAILY, Disp: , Rfl:   ???  NIFEdipine (PROCARDIA XL) 90 MG 24 hr tablet, Take 1 tablet (90 mg total) by mouth daily., Disp: 30 tablet, Rfl: 5  ???  ondansetron (ZOFRAN) 4 MG tablet, Take 4 mg by mouth every eight (8) hours as needed., Disp: , Rfl:   ???  pen needle, diabetic (PEN NEEDLE) 32 gauge x 5/32 (4 mm) Ndle, 120 each by Miscellaneous route Three (3) times a day with a meal., Disp: 120 each, Rfl: 3  ???  pregabalin (LYRICA) 100 MG capsule, Take 1 capsule (100 mg total) by mouth nightly., Disp: 30 capsule, Rfl: 2  ???  promethazine (PHENERGAN) 25 MG tablet, promethazine 25 mg tablet  TAKE ONE TABLET BY MOUTH EVERY 6 HOURS AS NEEDED, Disp: , Rfl:   ???  QUEtiapine (SEROQUEL) 300 MG tablet, Take 1 tablet (300 mg total) by mouth nightly., Disp: 90 tablet, Rfl: 4  ???  sertraline (ZOLOFT) 50 MG tablet, Take 150 mg by mouth daily. , Disp: , Rfl:   ???  sevelamer (RENVELA) 800 mg tablet, Take 1 tablet (800 mg total) by mouth Three (3) times a day with a meal., Disp: 90 tablet, Rfl: 11  ???  sofosbuvir-velpatasvir (EPCLUSA) 400-100 mg tablet, Take 1 tablet by mouth daily., Disp: 28 tablet, Rfl: 2  ???  sofosbuvir-velpatasvir (EPCLUSA) 400-100 mg tablet, Take 1 tablet by mouth daily., Disp: 28 tablet, Rfl: 2    Allergies  Nsaids (non-steroidal anti-inflammatory drug), Penicillin g, and Morphine    Family History   Problem Relation Age of Onset   ??? Diabetes Mother    ??? Cancer Mother    ??? Diabetes Father    ??? Heart disease Father    ??? Diabetes Child    ??? Kidney disease Brother         dialysis   ??? Heart disease Brother         on transplant list       Social History  Social History     Tobacco Use   ??? Smoking status: Current Every Day Smoker     Packs/day: 1.00     Types: Cigarettes   ??? Smokeless tobacco: Never Used   ??? Tobacco comment: referral to smoking cessation   Vaping Use   ??? Vaping Use:  Never used   Substance Use Topics   ??? Alcohol use: Not Currently     Comment: sober since Sep 2018   ??? Drug use: Not Currently     Types: Cocaine     Comment: not used since Sep 2018       Review of Systems    Constitutional: Negative for fever.  Eyes: Negative for visual changes.  ENT: Negative for sore throat.  Cardiovascular: Positive for palpitations. Negative for chest pain.  Respiratory: Positive for DOE.  Gastrointestinal: Negative for abdominal pain, vomiting or diarrhea.  Musculoskeletal: Positive for low back pain.  Skin: Negative for rash.  Neurological: Negative for headaches, focal weakness or numbness.    All other systems have been reviewed and are negative except as otherwise documented.     Physical Exam     This provider entered the patient's room: Yes:    ??? If this provider did not enter the room, a comprehensive physical exam was not able to be performed due to increased infection risk to themselves, other providers, staff and other patients), as well as to conserve personal protective equipment (PPE) utilization during the COVID-19 pandemic.    ??? If this provider did enter the patient room, the following was PPE worn: Surgical mask, eye protection and gloves    ED Triage Vitals [02/27/21 1322]   Enc Vitals Group      BP 165/74      Heart Rate 65      SpO2 Pulse       Resp 16      Temp 36.7 ??C (98.1 ??F)      Temp Source Oral      SpO2 100 %      Weight 80.7 kg (178 lb)      Height Head Circumference       Peak Flow       Pain Score       Pain Loc       Pain Edu?       Excl. in GC?        Constitutional: Alert and oriented. Well appearing and in no distress.  Eyes: Conjunctivae are normal.  ENT       Head: Normocephalic and atraumatic.       Neck: No stridor.  Cardiovascular:  Normal rate, regular rhythm. Normal and symmetric distal pulses are present in all extremities. No significant LE edema.   Respiratory: Normal respiratory effort. Breath sounds are normal in all lobes.  Gastrointestinal: Soft and nontender. There is no CVA tenderness.  Musculoskeletal: Good thrill at fistula site without any active bleeding.   Neurologic: Normal speech and language. 5/5 strength in all extremities. No gross focal neurologic deficits are appreciated.  Skin: Skin is warm, dry and intact. No rash noted.  Psychiatric: Mood and affect are normal. Speech and behavior are normal.             Zachery Conch, PA  03/04/21 [phone]

## 2021-02-27 NOTE — Unmapped (Signed)
Phone Visit Note    This medical encounter was conducted virtually using [email or web address]  TeleHealth protocols.      I have identified myself to the patient and conveyed my credentials to Ms. Copher  I have explained the capabilities and limitations of telemedicine and the patient/proxy and myself both agree that it is appropriate for their current circumstances/symptoms.     Contact Information  Person Contacted: [name]         Contact Phone number: [phone]      Is there someone else in the room? No.      Purpose of contact:     Ms. Tarrant is a 55 y.o. female is participating in a telephone visit for tobacco cessation counseling.  Patient consented to telephone visit given due to social isolation measures in place due to the COVID-19 pandemic.     General Information  [facility]  Type of Visit: Initial  Session Number: 1          Tobacco Use During Past 30 Days  Time Since Last Tobacco Use: smoked a cigarette today (at least one puff)     Type of Tobacco Products Used: Cigarettes  Quantity Used: 1 (pack)  Quantity Per: day     Time to First Use After Waking: Immediately  Wake During Sleep to Smoke: Nightly  Other Household Members Use Tobacco: Yes  Smoking Allowed in Home: No (Daughter is interested in quitting also since she is pregnant)          Tobacco Use History  Age Began Use (Years Old): 15        Longest Time Without Tobacco: Years  # of Hours, Days, Weeks, Months or Years:  (2)  Most Recent Attempt: 1-5 years  Medications Used in Past Attempts: Nicotine Patch  Side Effects:  (worked well at the hospital)   Pt has been prescribed bupropion, but has not picked it up from the pharmacy as she needs a ride from her daughter. She has the nicotine patch and gum at home from her last hospitalization and plans to start it in the next few days.     Behavioral Assessment  Why Uses:  (drinking coffee, riding in car, boredom)  Reasons to Become Tobacco Free:  (wants to get on the transplant list, daughter is quitting)  Barriers/Challenges:  (daughters husband lives with them and he still smokes, not very supportive of quitting)  Strategies:  (cleaning house, laundry, shows, deep breathing)   --Daughter plans to quit in the next few days as she finishes her last pack of cigarettes and pt plans to quit when she does. She will start the bupropion when she picks it up and use the NRT.     Treatment Plan  Cessation Meds Currently Using: None     Outpatient/Discharge Medications Recommended: Bupropion, Patch 21mg , Gum 4mg   Plan to Obtain Outpatient Meds: Pt already has medication   Correct use and side effects of the medication discussed. Medication info provided via MyChart. Pt was educated about expectation to use the medication for 3-6 months.   Follow-up Plan: Phone follow-up scheduled          Wrap-Up  Diagnosis: Tobacco use disorder, unspecified, uncomplicated (F17.200)  TTS Visit Length: >10 minutes     Tobacco use disorder        Follow Up Plan & Next Steps:  1) Please continue encouraging this Pt to reach their goals (see list above).   2) []  Patient will call office  to schedule follow up appointment with this provider  [x]  Scheduled follow up phone call per patient request for: Mar 25th at 4:30pm  []  Patient provided with instructions on how to reach this provider during times that the office is reducing face to face visits due to COVID-19    As part of this Telephone Visit, no in-person exam was conducted.     I personally spent 30 minutes counseling the patient via telephone about tobacco cessation.  I spent an additional 10 minutes on pre- and post-visit activities. [name] MD     The patient was physically located in [place] or a state in which I am permitted to provide care. The patient and/or parent/guardian understood that s/he may incur co-pays and cost sharing, and agreed to the telemedicine visit. The visit was reasonable and appropriate under the circumstances given the patient's presentation at the time.     The patient and/or parent/guardian has been advised of the potential risks and limitations of this mode of treatment (including, but not limited to, the absence of in-person examination) and has agreed to be treated using telemedicine. The patient's/patient's family's questions regarding telemedicine have been answered.      If the visit was completed in an ambulatory setting, the patient and/or parent/guardian has also been advised to contact their provider???s office for worsening conditions, and seek emergency medical treatment and/or call 911 if the patient deems either necessary.     Visit Format/Coding: Telephone     Coding: 98119 (21-30 minutes)  Service rendered over the phone most consistent with: Tobacco cessation counseling, greater than 10 minutes [phone])      [facility] [place] at [facility]  [address] [place], [place] [address] ??? Telephone [phone] ??? Fax [phone]  [email or web address]

## 2021-02-27 NOTE — Unmapped (Signed)
Pt missed dialysis yesterday d/t transportation not being available. Endorses fatigue and lower back pain/left hand pain. Also states that her fistula is leaking at times.

## 2021-02-27 NOTE — Unmapped (Signed)
Opened encounter in error  

## 2021-02-28 NOTE — Unmapped (Cosign Needed)
[facility] ESRD Consult Note     Requesting provider: Lucretia Roers  Service requesting consult: SRV  Reason for consult: ESRD, provision of dialysis  Indication for acute dialysis?: No and Yes, uremia     Outpatient dialysis unit: [place]  Outpatient dialysis schedule: MWF    Assessment/Recommendations: Alexandra Villarreal is a/an 55 y.o. female with a past medical history notable for ESRD on HD presenting to ED after missing HD session on Friday.     # ESRD: 4hrs, 3K, 2.5Ca, Na 137, Bicarbonate 35, 37C, Dialyzer: 180, Does not receive any heparin  -Will run 2 hours today, can be sent home afterwards from a nephrology standpoint    # Volume/ hypertension: EDW 84.5kg. Will attempt to achieve dry weight if tolerated.    # Anemia of Chronic Kidney Disease: Hemoglobin 11.6.    # Secondary Hyperparathyroidism/Hyperphosphatemia: Sevelamer 800 mg TID     # Vascular access: LUE AV fistula. Has had some bleeding after HD sessions recently (fistula placed February 2022). To have fistulogram on 3/15.    # Hospital problem: Missed HD. Fatigue    # Additional recommendations:  - Avoid nephrotoxic drugs; dose all meds for creatinine clearance < 10 ml/min   - Unless absolutely necessary, no MRIs with gadolinium.   - Implement save arm precautions.  Prefer needle sticks in the dorsum of the hands or wrists.  No blood pressure measurements in arm.  - If blood transfusion is requested during hemodialysis sessions, please alert [place] prior to the session.   - If a hemodialysis catheter line culture is requested, please alert [place] as only hemodialysis nurses are able to collect those specimens.     Recommendations were discussed with the primary team.    **Please contact Quin Hoop PRIOR to hospital discharge so that the nephrology team can arrange appropriate dialysis-related follow-up.**      History of Present Illness: Alexandra Villarreal is a/an 55 y.o. female with a past medical history notable for ESRD on HD presenting to ED after missing HD session on Friday. Reports some fatigue and left back pain and left hand pain. Reports some bleeding from fistula after HD session Wednesday, to have fistulogram on 3/15.      Medications:   Current Facility-Administered Medications   Medication Dose Route Frequency Provider Last Rate Last Admin   ??? acetaminophen (TYLENOL) tablet 650 mg  650 mg Oral Each time in dialysis PRN Justine Null, DO       ??? albumin human 25 % bottle 25 g  25 g Intravenous Each time in dialysis PRN Justine Null, DO         Current Outpatient Medications   Medication Sig Dispense Refill   ??? albuterol 2.5 mg /3 mL (0.083 %) nebulizer solution 3 mL.     ??? aspirin (ECOTRIN) 81 MG tablet Take 1 tablet (81 mg total) by mouth daily. 90 tablet 0   ??? atorvastatin (LIPITOR) 40 MG tablet Take 1 tablet (40 mg total) by mouth daily. For cholesterol 30 tablet 11   ??? bisacodyL (DULCOLAX, BISACODYL,) 10 mg suppository Continuous.     ??? bumetanide (BUMEX) 2 MG tablet Take 1 tablet (2 mg total) by mouth two (2) times a day. 60 tablet 11   ??? buPROPion (WELLBUTRIN XL) 150 MG 24 hr tablet Take 1 tablet (150 mg total) by mouth every morning. 30 tablet 1   ??? cyclobenzaprine (FLEXERIL) 5 MG tablet cyclobenzaprine 5 mg tablet   TAKE ONE TABLET BY MOUTH EVERY  DAY AS NEEDED     ??? diclofenac sodium (VOLTAREN) 1 % gel Apply 2 g topically four (4) times a day as needed for pain. 200 g 11   ??? hydrOXYzine (ATARAX) 10 MG tablet Take 10 mg by mouth Three (3) times a day as needed for anxiety.     ??? insulin ASPART (NOVOLOG FLEXPEN U-100 INSULIN) 100 unit/mL (3 mL) injection pen Novolog Flexpen U-100 Insulin aspart 100 unit/mL (3 mL) subcutaneous   INJECT SUBCUTANEOUSLY TID PER SLIDING SCALE     ??? insulin glargine (LANTUS) 100 unit/mL injection Inject 0.14 mL (14 Units total) under the skin nightly. 15 mL 2   ??? lamoTRIgine (LAMICTAL) 100 MG tablet Take 1 tablet (100 mg total) by mouth nightly. 90 tablet 0   ??? levothyroxine (SYNTHROID) 25 MCG tablet Take 1 tablet (25 mcg total) by mouth daily. 30 tablet 2   ??? melatonin 3 mg Tab Take 1 tablet (3 mg total) by mouth every evening. 60 tablet 11   ??? metoclopramide (REGLAN) 10 MG tablet Take 10 mg by mouth daily as needed for GI discomfort.     ??? metoprolol tartrate (LOPRESSOR) 100 MG tablet metoprolol tartrate 100 mg tablet   TAKE 1/2 TABLET BY MOUTH DAILY     ??? NIFEdipine (PROCARDIA XL) 90 MG 24 hr tablet Take 1 tablet (90 mg total) by mouth daily. 30 tablet 5   ??? ondansetron (ZOFRAN) 4 MG tablet Take 4 mg by mouth every eight (8) hours as needed.     ??? pen needle, diabetic (PEN NEEDLE) 32 gauge x 5/32 (4 mm) Ndle 120 each by Miscellaneous route Three (3) times a day with a meal. 120 each 3   ??? pregabalin (LYRICA) 100 MG capsule Take 1 capsule (100 mg total) by mouth nightly. 30 capsule 2   ??? promethazine (PHENERGAN) 25 MG tablet promethazine 25 mg tablet   TAKE ONE TABLET BY MOUTH EVERY 6 HOURS AS NEEDED     ??? QUEtiapine (SEROQUEL) 300 MG tablet Take 1 tablet (300 mg total) by mouth nightly. 90 tablet 4   ??? sertraline (ZOLOFT) 50 MG tablet Take 150 mg by mouth daily.      ??? sevelamer (RENVELA) 800 mg tablet Take 1 tablet (800 mg total) by mouth Three (3) times a day with a meal. 90 tablet 11   ??? sofosbuvir-velpatasvir (EPCLUSA) 400-100 mg tablet Take 1 tablet by mouth daily. 28 tablet 2   ??? sofosbuvir-velpatasvir (EPCLUSA) 400-100 mg tablet Take 1 tablet by mouth daily. 28 tablet 2        ALLERGIES  Nsaids (non-steroidal anti-inflammatory drug), Penicillin g, and Morphine    MEDICAL HISTORY  Past Medical History:   Diagnosis Date   ??? AKI (acute kidney injury) (CMS-HCC) 09/07/2016   ??? Bipolar affective disorder (CMS-HCC)    ??? Chest pain 08/05/2016   ??? Chronic pain syndrome    ??? Constipation 08/14/2016   ??? Coronary artery disease    ??? DDD (degenerative disc disease), lumbar    ??? Dehydration 09/07/2016   ??? Depression    ??? Diabetes mellitus type 1 (CMS-HCC)    ??? Dyslipidemia    ??? Elevated lactic acid level 08/18/2017   ??? Epigastric pain 08/05/2016   ??? Hepatitis C    ??? History of heart artery stent     coronary artery in 2014   ??? History of migraine headaches    ??? History of seizures    ??? Hyperglycemia 09/07/2016    Resume  home insulin    ??? Hyperkalemia 01/15/2021   ??? Hypertension    ??? Hyponatremia 09/07/2016   ??? Jejunal ulcer 2016    with GI bleed   ??? Melanoma (CMS-HCC)     right arm   ??? Metabolic acidosis, nongap 09/07/2016   ??? MI (myocardial infarction) (CMS-HCC) 2010    stent placement   ??? Morbid obesity (CMS-HCC)     with gastric bypass   ??? Need for prophylactic vaccination and inoculation against viral hepatitis 11/27/2013   ??? Osteoarthritis    ??? Urinary retention 01/16/2021   ??? Volume overload 01/07/2021        SOCIAL HISTORY  Social History     Socioeconomic History   ??? Marital status: Legally Separated     Spouse name: Not on file   ??? Number of children: Not on file   ??? Years of education: Not on file   ??? Highest education level: Not on file   Occupational History   ??? Not on file   Tobacco Use   ??? Smoking status: Current Every Day Smoker     Packs/day: 1.00     Types: Cigarettes   ??? Smokeless tobacco: Never Used   ??? Tobacco comment: referral to smoking cessation   Vaping Use   ??? Vaping Use: Never used   Substance and Sexual Activity   ??? Alcohol use: Not Currently     Comment: sober since Sep 2018   ??? Drug use: Not Currently     Types: Cocaine     Comment: not used since Sep 2018   ??? Sexual activity: Not on file   Other Topics Concern   ??? Not on file   Social History Narrative    02/21/2021    Moved from [place] to [place] to be closer to family. Lives with daughter, son in law and granddaughter (age 33). Son and his family live across the street. Communicates with sister and additional 2 daughters regularly. Separated for many years        Grew up living with her mother and father. Dropped out of school in the 10th grade. Completed GED. Went to college and got a nursing degree. One brother and one sister are living. One brother died in MVA.         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 Relapse in 2022 after pain med prescription for steal syndrome    ---------    02/01/2021    - Lives in [place] with daughter    - [facility] - [place] and makes appointment (11am -3/4pm appointments)    ----------    Lives with her niece.         From prior note:        Marital Status Divorced,     Occupation Employed, Works at [employer],  works 9-10 hour shifts.     Mobility No assistive device    ADLs Independent of all ADLs    Medication Management:  Self,     Medical sales representative, Studied nursing.     Pets two cats      Children Three daugthers and one son       Social Determinants of Health     Financial Resource Strain: Medium Risk   ??? Difficulty of Paying Living Expenses: Somewhat hard   Food Insecurity: Food Insecurity Present   ??? Worried About  Running Out of Food in the Last Year: Sometimes true   ??? Ran Out of Food in the Last Year: Sometimes true   Transportation Needs: No Transportation Needs   ??? Lack of Transportation (Medical): No   ??? Lack of Transportation (Non-Medical): No   Physical Activity: Not on file   Stress: Not on file   Social Connections: Not on file        FAMILY HISTORY  Family History   Problem Relation Age of Onset   ??? Diabetes Mother    ??? Cancer Mother    ??? Diabetes Father    ??? Heart disease Father    ??? Diabetes Child    ??? Kidney disease Brother         dialysis   ??? Heart disease Brother         on transplant list        Review of Systems:  A 12 system review of systems was negative except as noted in HPI.    Physical Exam:  Vitals:    02/27/21 1322   BP: 165/74   Pulse: 65   Resp: 16   Temp: 36.7 ??C   SpO2: 100%     No intake/output data recorded.  No intake or output data in the 24 hours ending 02/27/21 1741  General: well-appearing, no acute distress  HEENT: anicteric sclera, oropharynx clear without lesions  CV: RRR, no m/r/g, no edema  Lungs: CTAB, normal wob  Abd: soft, non-tender, non-distended  Skin: Left arm red  Psych: alert, engaged, appropriate mood and affect  Neuro: normal speech, no gross focal deficits     Test Results  Reviewed  Lab Results   Component Value Date    NA 128 (L) 02/27/2021    K 4.8 02/27/2021    CL 96 (L) 02/27/2021    CO2 23.0 02/27/2021    BUN 33 (H) 02/27/2021    CREATININE 2.71 (H) 02/27/2021    GLU 163 02/27/2021    CALCIUM 8.4 (L) 02/27/2021    ALBUMIN 3.3 (L) 02/03/2021    PHOS 4.8 02/04/2021       I have reviewed relevant outside healthcare records

## 2021-02-28 NOTE — Unmapped (Signed)
[facility] Nephrology Hemodialysis Procedure Note     02/27/2021    Alexandra Villarreal was seen and examined on hemodialysis    CHIEF COMPLAINT: End Stage Renal Disease    INTERVAL HISTORY: Some fatigue, tolerated HD.    DIALYSIS TREATMENT DATA:  Estimated Dry Weight (kg): 78 kg (171 lb 15.3 oz) (verbal pt reported)  Patient Goal Weight (kg): 2 kg (4 lb 6.6 oz) (v/o MD Reynolds)  Dialyzer: F-180 (98 mLs)  Dialysis Bath  Bath: 3 K+ / 2.5 Ca+  Dialysate Na (mEq/L): 137 mEq/L  Dialysate HCO3 (mEq/L): 35 mEq/L  Blood Flow Rate (mL/min): 380 mL/min  Dialysis Flow (mL/min): 800 mL/min    PHYSICAL EXAM:  Vitals:  Temp:  [36.7 ??C] 36.7 ??C  Heart Rate:  [58-65] 61  BP: (140-165)/(63-74) 146/69  MAP (mmHg):  [101-106] (P) 106  Weights:  Pre-Treatment Weight (kg): 82.5 kg (181 lb 14.1 oz)    General: in no acute distress, currently dialyzing in a chair  Pulmonary: clear to auscultation and percussion  Cardiovascular: regular rate and rhythm  Extremities: no significant  edema  Access: LUE AV fistula    LAB DATA:  Lab Results   Component Value Date    NA 128 (L) 02/27/2021    K 4.8 02/27/2021    CL 96 (L) 02/27/2021    CO2 23.0 02/27/2021    BUN 33 (H) 02/27/2021    CREATININE 2.71 (H) 02/27/2021    CALCIUM 8.4 (L) 02/27/2021    MG 1.8 02/04/2021    PHOS 4.8 02/04/2021    ALBUMIN 3.3 (L) 02/03/2021      Lab Results   Component Value Date    HCT 33.6 (L) 02/27/2021    WBC 5.9 02/27/2021        ASSESSMENT/PLAN:  End Stage Renal Disease on Intermittent Hemodialysis:  UF goal: 2 L as tolerated  Adjust medications for a GFR <10  Avoid nephrotoxic agents     Bone Mineral Metabolism:  Lab Results   Component Value Date    CALCIUM 8.4 (L) 02/27/2021    CALCIUM 8.6 (L) 02/05/2021    Lab Results   Component Value Date    ALBUMIN 3.3 (L) 02/03/2021    ALBUMIN 2.6 (L) 01/08/2021    ALBUMIN 2.6 (L) 01/08/2021      Lab Results   Component Value Date    PHOS 4.8 02/04/2021    PHOS 4.5 01/15/2021    Lab Results   Component Value Date    PTH 627.8 (H) 01/07/2021      Continue phosphorus binder and dietary counseling.    Anemia:   Lab Results   Component Value Date    HGB 11.6 02/27/2021    HGB 12.5 02/04/2021    HGB 14.1 02/03/2021    No results found for: LABIRON   Lab Results   Component Value Date    FERRITIN 136.0 08/05/2016       Anemia labs appropriate, no changes.    Justine Null, DO  [facility]pert[facility]

## 2021-02-28 NOTE — Unmapped (Signed)
HEMODIALYSIS NURSE PROCEDURE NOTE    Treatment Number:  1 Room/Station:  9 Procedure Date:  02/27/21   Total Treatment Time:  124 Min.    CONSENT:  Written consent was obtained prior to the procedure and is detailed in the medical record. Prior to the start of the procedure, a time out was taken and the identity of the patient was confirmed via name, medical record number and date of birth.     WEIGHTS:  Hemodialysis Pre-Treatment Weights     Date/Time Pre-Treatment Weight (kg) Estimated Dry Weight (kg) Patient Goal Weight (kg) Total Goal Weight (kg)    02/27/21 1740 82.5 kg (181 lb 14.1 oz) 78 kg (171 lb 15.3 oz)  verbal pt reported 2 kg (4 lb 6.6 oz)  v/o MD Reynolds 2.5 kg (5 lb 8.2 oz)           Hemodialysis Post Treatment Weights     Date/Time Post-Treatment Weight (kg) Treatment Weight Change (kg)    02/27/21 2030 79.9 kg (176 lb 2.4 oz) -2.61 kg        Active Dialysis Orders (168h ago, onward)     Start     Ordered    02/27/21 1758  Hemodialysis inpatient  (Dialysis ONCE)  Once        Question Answer Comment   K+ 2 meq/L    Ca++ 2.5 meq/L    Bicarb 35 meq/L    Na+ 137 meq/L    Na+ Modeling none    Dialyzer F180NR    Dialysate Temperature (C) 36.5    BFR-As tolerated to a maximum of: 400 mL/min    DFR 800 mL/min    Duration of treatment 2 Hr    Dry weight (kg) 84.5    Challenge dry weight (kg) no    Fluid removal (L) to EDW    Tubing Adult = 142 ml    Access Site AVF    Access Site Location Left    Keep SBP >: 90        02/27/21 1757    02/27/21 1657  Dialysis Schedule Order  (Dialysis ONCE)  Once         02/27/21 1658              ACCESS SITE:             Arteriovenous Fistula - Vein Graft  Access 01/07/21 0500 Arteriovenous fistula Left;Upper Arm (Active)   Site Assessment Clean;Dry;Intact 02/27/21 2030   AV Fistula Thrill Present;Bruit Present 02/27/21 2030   Status Deaccessed 02/27/21 2030   Dressing Intervention Dressing changed 02/27/21 2030   Dressing Status      Clean;Dry;Changed 02/27/21 2030   Site Condition Bleeding;No complications 02/27/21 2030   Dressing Gauze;Other (Comment) 02/27/21 2030   Dressing To Be Removed (Date/Time) Within 24 hours per RN discretion 02/27/21 2030         Patient Lines/Drains/Airways Status     Active Peripheral & Central Intravenous Access     Name Placement date Placement time Site Days    Peripheral IV 02/27/21 Right Antecubital 02/27/21  1430  Antecubital  less than 1              LAB RESULTS:  Lab Results   Component Value Date    NA 128 (L) 02/27/2021    K 4.8 02/27/2021    CL 96 (L) 02/27/2021    CO2 23.0 02/27/2021    BUN 33 (H) 02/27/2021    CREATININE 2.71 (H)  02/27/2021    GLU 163 02/27/2021    CALCIUM 8.4 (L) 02/27/2021    CAION 4.65 01/11/2021    PHOS 4.8 02/04/2021    MG 1.8 02/04/2021    PTH 627.8 (H) 01/07/2021    FERRITIN 136.0 08/05/2016     Lab Results   Component Value Date    WBC 5.9 02/27/2021    HGB 11.6 02/27/2021    HCT 33.6 (L) 02/27/2021    PLT 184 02/27/2021    APTT 31.8 12/25/2018        VITAL SIGNS:  Temperature     Date/Time Temp Temp src       02/27/21 2022 36.2 ??C (97.1 ??F) Temporal     02/27/21 1800 36.7 ??C (98 ??F) Oral         Hemodynamics     Date/Time Pulse BP MAP (mmHg) Patient Position    02/27/21 2022 59 137/69 ??? Lying    02/27/21 2000 60 144/72 ??? Lying    02/27/21 1930 60 145/75 ??? Lying    02/27/21 1900 61 146/69 ??? Sitting    02/27/21 1830 61 141/74 ??? ???    02/27/21 1818 60 144/63 ??? Sitting    02/27/21 1800 58 140/70 ??? Lying    02/27/21 1700 58 (P)  167/80 (P)  106 (P)  Lying (P)         Blood Volume Monitor     Date/Time Blood Volume Change (%) HCT HGB Critline O2 SAT %    02/27/21 2022 -6.9 % 34.5 11.7 92.8    02/27/21 2000 -5.1 % 33.8 11.5 90.5    02/27/21 1930 -3.9 % 33.4 11.4 91.8    02/27/21 1900 -4 % 33.4 11.4 91.8    02/27/21 1830 -4.2 % 33.5 11.4 93.8    02/27/21 1818 ??? 32.2 10.9 93.2        Oxygen Therapy     Date/Time Resp SpO2 O2 Device FiO2 (%) O2 Flow Rate (L/min)    02/27/21 2022 16 ??? ??? -- --    02/27/21 2000 16 ??? ??? -- -- 02/27/21 1930 16 ??? ??? -- --    02/27/21 1900 16 ??? AMBU / Manual Ventilation Bag -- --    02/27/21 1830 16 ??? ??? -- --    02/27/21 1818 16 ??? None (Room air) -- --    02/27/21 1800 16 100 % None (Room air) -- --    02/27/21 1700 ??? 93 % (P)  ??? -- --        Oxygen Connected to Wall:  no    Pre-Hemodialysis Assessment     Date/Time Therapy Number Dialyzer All Research scientist (physical sciences) Detector Dialysis Flow (mL/min)    02/27/21 1740 1 F-180 (98 mLs) Yes Engaged 800 mL/min    Date/Time Verify Priming Solution Priming Volume Hemodialysis Independent pH Hemodialysis Machine Conductivity (mS/cm) Hemodialysis Independent Conductivity (mS/cm)    02/27/21 1740 0.9% NS 300 mL ??? 13.6 mS/cm 13.5 mS/cm    Date/Time Bicarb Conductivity Residual Bleach Negative Free Chlorine Total Chlorine Chloramine    02/27/21 1740 -- Yes -- 0 --        Pre-Hemodialysis Treatment Comments     Date/Time Pre-Hemodialysis Comments    02/27/21 1740 vss, alert/oriented        Hemodialysis Treatment     Date/Time Blood Flow Rate (mL/min) Arterial Pressure (mmHg) Venous Pressure (mmHg) Transmembrane Pressure (mmHg)    02/27/21 2022 200 mL/min -209 mmHg 176 mmHg 56 mmHg  02/27/21 2000 350 mL/min -239 mmHg 148 mmHg 72 mmHg    02/27/21 1930 350 mL/min -251 mmHg 172 mmHg 71 mmHg    02/27/21 1900 380 mL/min -232 mmHg 173 mmHg 63 mmHg    02/27/21 1830 400 mL/min -202 mmHg 212 mmHg 57 mmHg    02/27/21 1818 400 mL/min -200 mmHg 210 mmHg 60 mmHg    Date/Time Ultrafiltration Rate (mL/hr) Ultrafiltrate Removed (mL) Dialysate Flow Rate (mL/min) KECN Linna Caprice)    02/27/21 2022 0 mL/hr 2550 mL 800 ml/min ???    02/27/21 2000 1280 mL/hr 2106 mL 800 ml/min ???    02/27/21 1930 1280 mL/hr 1469 mL 800 ml/min ???    02/27/21 1900 1280 mL/hr 854 mL 800 ml/min ???    02/27/21 1830 1280 mL/hr 228 mL 800 ml/min ???    02/27/21 1818 1280 mL/hr 0 mL 800 ml/min ???        Hemodialysis Treatment Comments     Date/Time Intra-Hemodialysis Comments    02/27/21 2022 tx complete, rinseback done 02/27/21 2000 Pt resting quietly, vitals stable, access visible    02/27/21 1930 Pt resting quietly, vitals stable, access visible    02/27/21 1900 vss, alert, MD Jacinto Reap rounding    02/27/21 1830 vss, alert, BFR reduced r/t High AP    02/27/21 1818 vss, alert/oriented, HD initiated        Post Treatment     Date/Time Rinseback Volume (mL) On Line Clearance: spKt/V Total Liters Processed (L/min) Dialyzer Clearance    02/27/21 2030 300 mL ???  Kt 30.3 41.1 L/min Lightly streaked          Post Hemodialysis Treatment Comments     Date/Time Post-Hemodialysis Comments    02/27/21 2030 Pt vitals stable, denies complaints        POST TREATMENT ASSESSMENT:  General appearance:  alert, cooperative and no distress  Neurological:  Grossly normal  Lungs:  diminished breath sounds bilaterally  Hearts:  regular rate and rhythm, S1, S2 normal, no murmur, click, rub or gallop  Abdomen:  soft, non-tender; bowel sounds normal; no masses,  no organomegaly  Skin:  Skin color, texture, turgor normal. No rashes or lesions    Hemodialysis I/O     Date/Time Total Hemodialysis Replacement Volume (mL) Total Ultrafiltrate Output (mL)    02/27/21 2030 ??? 2000 mL        25-B-25-B - Medicaitons Given During Treatment  (last 4 hrs)         ** No medications to display **            Patient tolerated treatment in a  Stretcher.

## 2021-02-28 NOTE — Unmapped (Addendum)
ED Progress Note    I assumed care of this patient at approximately 5 PM.  Please see full note by Bluford Kaufmann, PA.  At time of signout I note patient to dialysis having missed dialysis appointment yesterday.  We will continue to follow and monitor as needed.      9:43 PM  Patient has returned from dialysis and states feeling a bit tired but ready for discharge to home.  Note sodium 128 but is patient's baseline.    I did review ECG 1554 sinus bradycardia with a rate of 58 bpm with read as noted below.  Troponin is negative and plan for repeat troponin and repeat BMP at this time.  Repeat ECG as well.    ECG 12 Lead    Result Date: 02/27/2021  SINUS BRADYCARDIA POSSIBLE LEFT ATRIAL ENLARGEMENT ST & T WAVE ABNORMALITY, CONSIDER INFERIOR ISCHEMIA ABNORMAL ECG WHEN COMPARED WITH ECG OF 14-Jan-2021 18:12, INVERTED T WAVES HAVE REPLACED NONSPECIFIC T WAVE ABNORMALITY IN INFERIOR LEADS Confirmed by Sheran Luz ([id number]) on 02/27/2021 6:14:44 PM    11:29 PM  Repeat troponin is 12 with a delta of 3 and sodium is 13 1 which is baseline.    ECG is not significantly changed from 127 of 2022.  Patient is not having any chest pain or chest pain equivalent.  Is feeling well and ready for discharge to home.      Precautions given for return. Discussed my evaluation of the patient's symptoms, my clinical impression, and my proposed outpatient treatment plan with the patient. We have discussed anticipatory guidance, scheduled follow-up, and careful return precautions.  The patient expresses understanding, is agreeable, and comfortable with the discharge plan. Denies any questions or concerns at this time. NAD noted upon discharge.

## 2021-02-28 NOTE — Unmapped (Signed)
Patient arrived to dialysis unit via stretcher.  Patient presents alert/oriented, vital signs stable; proceed with ordered HD 02/27/2021:    Hemodialysis inpatient  (Dialysis ONCE)  Once        Question Answer Comment   K+ 2 meq/L    Ca++ 2.5 meq/L    Bicarb 35 meq/L    Na+ 137 meq/L    Na+ Modeling none    Dialyzer F180NR    Dialysate Temperature (C) 36.5    BFR-As tolerated to a maximum of: 400 mL/min    DFR 800 mL/min    Duration of treatment 2 Hr    Dry weight (kg) 84.5    Challenge dry weight (kg) no    Fluid removal (L) to EDW    Tubing Adult = 142 ml    Access Site AVF    Access Site Location Left    Keep SBP >: 90

## 2021-03-01 DIAGNOSIS — I10 Essential (primary) hypertension: Principal | ICD-10-CM

## 2021-03-01 NOTE — Unmapped (Signed)
Thank you for your e-Consult.    To summarize: Ms. Checo is a 55 y/o female with uncontrolled HTN.  She is currently in the hospital getting dialyzed adequately and her B.P are much better controlled.    My recommendations are as follows: The guidelines of B.P management are similar to any other patient. She is currently on Metoprolol/Nifedipine/Bumex. The first step is usually to make sure she is completing her dialysis, compliant with her low salt diet and not drinking too much of water in between dialysis treatments.  If needed, since her potassium is ok, you could consider addition of ARB.     I spent 5-10 minutes in medical consultative discussion and review of medical records, including a written report to the treating provider via electronic health record regarding the condition of this patient.    This e-Consult did include an answerable clinical question and did not recommend a clinic visit.    Leeroy Bock, MD    The recommendations provided in this eConsult are based on the clinical data available to me and are furnished without the benefit of a comprehensive in-person evaluation of the patient. Any new clinical issues or changes in patient status not available to me will need to be taken into account when assessing these recommendations. The ongoing management of this patient is the responsibility of the referring clinician. Please contact me if you have further questions.

## 2021-03-02 ENCOUNTER — Ambulatory Visit: Admit: 2021-03-02 | Discharge: 2021-03-03 | Payer: MEDICARE

## 2021-03-02 ENCOUNTER — Telehealth
Admit: 2021-03-02 | Discharge: 2021-03-03 | Payer: MEDICARE | Attending: Psychiatric/Mental Health | Primary: Psychiatric/Mental Health

## 2021-03-02 DIAGNOSIS — I10 Essential (primary) hypertension: Principal | ICD-10-CM

## 2021-03-02 DIAGNOSIS — F331 Major depressive disorder, recurrent, moderate: Principal | ICD-10-CM

## 2021-03-02 MED ORDER — HYDROXYZINE HCL 10 MG TABLET
ORAL_TABLET | Freq: Three times a day (TID) | ORAL | 2 refills | 10.00000 days | Status: CP | PRN
Start: 2021-03-02 — End: ?

## 2021-03-02 MED ADMIN — midazolam (VERSED) injection: INTRAVENOUS | @ 19:00:00 | Stop: 2021-03-02

## 2021-03-02 MED ADMIN — iohexoL (OMNIPAQUE) 300 mg iodine/mL solution 35 mL: 35 mL | INTRAVENOUS | @ 19:00:00 | Stop: 2021-03-02

## 2021-03-02 MED ADMIN — lidocaine (XYLOCAINE) 10 mg/mL (1 %) injection: INTRADERMAL | @ 18:00:00 | Stop: 2021-03-02

## 2021-03-02 MED ADMIN — fentaNYL (PF) (SUBLIMAZE) injection: INTRAVENOUS | @ 19:00:00 | Stop: 2021-03-02

## 2021-03-02 NOTE — Unmapped (Signed)
Assessment/Plan:    Alexandra Villarreal is a 55 y.o. female who will undergo Sedation, Left upper extremity shuntogram and Possible intervention.    Pre op Plan:   - I have reviewed the risk and benefits of the procedure with the patient.  The alternatives and natural course of there disease process have been explained. The patient has had the opportunity to ask questions. We have been asked to proceed.          Last Cr:    Lab Results   Component Value Date    CREATININE 2.71 (H) 02/27/2021          HPI: 55yo F w/ history of ESRD on HD s/p L brachio-basilic fistula with steal syndrome and tissue loss on her L hand s/p fistula revision with RUDI on 02/04/2021 presenting for follow-up.  She reports that her wounds are healing.  She feels better, but still is having numbness to her 1st and 2nd digits.  No changes in health.  No fever/chills.  She has been having high pressures during HD which should be investigated with a fistulagram    Allergies:   Allergies   Allergen Reactions   ??? Nsaids (Non-Steroidal Anti-Inflammatory Drug) Other (See Comments)   ??? Penicillin G    ??? Morphine Itching       Medications:   No current facility-administered medications for this encounter.       PMH:   Past Medical History:   Diagnosis Date   ??? AKI (acute kidney injury) (CMS-HCC) 09/07/2016   ??? Bipolar affective disorder (CMS-HCC)    ??? Chest pain 08/05/2016   ??? Chronic pain syndrome    ??? Constipation 08/14/2016   ??? Coronary artery disease    ??? DDD (degenerative disc disease), lumbar    ??? Dehydration 09/07/2016   ??? Depression    ??? Diabetes mellitus type 1 (CMS-HCC)    ??? Dyslipidemia    ??? Elevated lactic acid level 08/18/2017   ??? Epigastric pain 08/05/2016   ??? Hepatitis C    ??? History of heart artery stent     coronary artery in 2014   ??? History of migraine headaches    ??? History of seizures    ??? Hyperglycemia 09/07/2016    Resume home insulin    ??? Hyperkalemia 01/15/2021   ??? Hypertension    ??? Hyponatremia 09/07/2016   ??? Jejunal ulcer 2016    with GI bleed   ??? Melanoma (CMS-HCC)     right arm   ??? Metabolic acidosis, nongap 09/07/2016   ??? MI (myocardial infarction) (CMS-HCC) 2010    stent placement   ??? Morbid obesity (CMS-HCC)     with gastric bypass   ??? Need for prophylactic vaccination and inoculation against viral hepatitis 11/27/2013   ??? Osteoarthritis    ??? Urinary retention 01/16/2021   ??? Volume overload 01/07/2021       ASA Grade: ASA 3 - Patient with moderate systemic disease with functional limitations  Mallampati Grade: Grade II:  Soft palate, uvula, fauces visible.    ROS:  General: Denies fever or chills  Cardiovascular: Denies recent chest pain or palpitations  Pulmonary: Denies recent respiratory complaints or changes  Vascular: No change in walking distance, No abdominal pain, No weight loss, No recent chest pain or shortness of breath and No focal neurologic symptoms    PE:    There were no vitals filed for this visit.  HEENT:  NCAT, No otorrhea or rhinorrhea appreciated.  Sclerae  are anicteric.  No anisocoria  PULMONARY:  No increased work of breathing.  Normal phonation  CARDIAC:  Regular rate and rhythm  ABDOMEN:  Soft, nontender, nondistended.  No hernia appreciate, no palpable masses, no pulsatile mass at the level of the umbilicus  EXTREMITIES:  Warm and well perfused. No ulceration or gangrene of the bilateral lower extremities

## 2021-03-02 NOTE — Unmapped (Signed)
This was a telehealth service performed by a resident and I was available to the resident via real-time audio and video connection. Immediately after or during the visit, I reviewed with the resident the medical history and the resident???s assessment and plan. I discussed with the resident the patient???s diagnosis and concur with the treatment plan as documented in the resident note.

## 2021-03-02 NOTE — Unmapped (Signed)
[facility]  Psychiatry   Established Patient E&M Service - Outpatient       Assessment:    Alexandra Villarreal presents for follow-up evaluation.     Identifying Information:  Alexandra Villarreal is a 55 y.o. female with a history of Bipolar disorder, OUD, cocaine use disorder.  Past mental health treatment from [facility]. Records requested.  At initial appt with this Clinical research associate, Alexandra Villarreal is endorsed low mood, wanting to socially isolate, low motivation. Episodes of depression often pass without requiring changes in medication and finds a lot of support in out patient therapy. Alexandra Villarreal has been admitted for Steal Syndrome (complication of dialysis) and start depression feels significantly worse since discharge. Primary care started bupropion for smoking cessation and hoping for additional mood benefits. Will monitor bupropion use for possible activation of mania or anxiety related to Bipolar disorder.     Risk Assessment:  A full suicide and violence risk assessment was performed as part of this patient's initial evaluation with [facility] outpatient psychiatry.  There is no new acute risk for suicide or violence at this time. The patient was educated about relevant modifiable risk factors including following recommendations for treatment of psychiatric illness and abstaining from substance abuse.   While future psychiatric events cannot be accurately predicted, the patient does not currently require acute inpatient psychiatric care and does not currently meet [facility] involuntary commitment criteria.      Plan:    Problem: Bipolar disorder  Status of problem:  new problem to this provider  Interventions:  - continue bupropion 150 mg daily- prescribed by primary care for smoking cessation   Discussed possible risk of side effects of bupropion including seizure, induction of mania and SI, anorexia, insomnia, dizziness, HA, agitation, hypertension, tremor, increased anxiety.     - continue lamotrigine 100 mg  Discussed possible side effects of lamictal including rash, Stevens-Johnson Syndrome, sedation, tremor, HA, GI effects, ataxia, withdrawal seizures, activation of SI, insomnia.     - continue quetiapine 200 mg qhs  Discussed possible risk of side effects for quetiapine including hyperglycemia, NMS, seizure, increased risk for diabetes and dyslipidemia, dizziness, sedation, tachycardia, orthostatic hypotension, tardive dyskinesia.     - continue sertraline 150 mg  Discussed possible risk of side effects of sertraline including seizure, induction of mania, activation of SI, sexual dysfunction, GI, sedation, HA dizziness, hyponatremia, serotonin syndrome.      - continue pregabalin 50 mg bid- prescribed by primary care    -  Decrease clonazepam from  0.5 mg to 0.25 mg (half tab) qhs with plan to discontinue.   Discussed possible risk of side effects of long-term use of benzodiazepines including sedation, fatigue, CNS depression, forgetfulness, depression, confusion, hepatic dysfunction, renal dysfunction, development to tolerance. Risk of fatality if overdosed. Long term use shows lack of efficacy, risk of dependence, increased risk of cognitive effects. Risk of seizure upon withdrawal.   ??   Requesting referral for therapy. Ok with individual therapy from [facility].   ??  Problem: Opiate use disorder/ cocaine use disorder  Status of problem:  new problem to this provider  Interventions: presently in remission.         Psychotherapy provided:  No billable psychotherapy service provided but brief supportive therapy was utilized.    Patient has been given this writer's contact information as well as the [facility] urgent line number. The patient has been instructed to call 911 for emergencies.  Subjective:    Interval History:   I'm ok. Has a procedure scheduled today to address problems with her fistula. Experiencing numbness and pain in her hand. Attending dialysis 3 days a week. Pain is improving with dialysis sessions and is dependent on what position [place] can be comfortable in. Primary care prescribed bupropion that [facility] started this morning. Goal is to quit smoking. Started Hep C treatment last night, Epclus[facility]). Depression has increased since hospitalization. Endorsing wanting to sleep all the time and isolate in her bedroom. I don't have anything to say to anybody. Missing sister in [place] and Daughter in [place]. Children are too worried about Preeya's neuropathy to allow Aramis to get her license back. This feels frustrating for Mileidy and limits independence. Encouraged Alexandra Villarreal to speak with her team about if driving is a reasonable goal for the future. Sleeping a lot during the day and waking up a million times at night. Not taking clonazepam every night. Would like to discontinue. Falls asleep gradually. Also taking quetiapine and hydroxyzine at bedtime. When wakes up at 3am, will be up for an hour, drink a cup of coffee, smoke a cigarette and then will be able to fall asleep until 7am. Discussed and encouraged sleep hygiene.       Objective:    Mental Status Exam:  Appearance:    Appears stated age and Clean/Neat   Motor:   No abnormal movements   Speech/Language:    Normal rate, volume, tone, fluency   Mood:   Depressed   Affect:   Depressed   Thought process and Associations:   Logical, linear, clear, coherent, goal directed   Abnormal/psychotic thought content:     Denies SI, HI, self harm, delusions, obsessions, paranoid ideation, or ideas of reference   Perceptual disturbances:     Denies auditory and visual hallucinations, behavior not concerning for response to internal stimuli     Other:            Visit was completed by video (or phone) and the appropriate disclaimer has been included below.          Rosezetta Schlatter, PMHNP        The patient reports they are currently: at home. I spent 25 minutes on the real-time audio and video with the patient on the date of service. I spent an additional 7 minutes on pre- and post-visit activities on the date of service.     The patient was physically located in [place] or a state in which I am permitted to provide care. The patient and/or parent/guardian understood that s/he may incur co-pays and cost sharing, and agreed to the telemedicine visit. The visit was reasonable and appropriate under the circumstances given the patient's presentation at the time.    The patient and/or parent/guardian has been advised of the potential risks and limitations of this mode of treatment (including, but not limited to, the absence of in-person examination) and has agreed to be treated using telemedicine. The patient's/patient's family's questions regarding telemedicine have been answered.     If the visit was completed in an ambulatory setting, the patient and/or parent/guardian has also been advised to contact their provider???s office for worsening conditions, and seek emergency medical treatment and/or call 911 if the patient deems either necessary.

## 2021-03-03 NOTE — Unmapped (Signed)
Post procedure call complete. Pt states she is doing well.  Encouraged to call with any questions/concerns.

## 2021-03-04 ENCOUNTER — Ambulatory Visit: Admit: 2021-03-04 | Discharge: 2021-03-05 | Payer: MEDICARE | Attending: Family Medicine | Primary: Family Medicine

## 2021-03-04 DIAGNOSIS — E039 Hypothyroidism, unspecified: Principal | ICD-10-CM

## 2021-03-04 LAB — LIPID PANEL
CHOLESTEROL/HDL RATIO SCREEN: 2.5 (ref 1.0–4.5)
CHOLESTEROL: 125 mg/dL (ref <=200)
HDL CHOLESTEROL: 50 mg/dL (ref 40–60)
LDL CHOLESTEROL CALCULATED: 51 mg/dL (ref 40–99)
NON-HDL CHOLESTEROL: 75 mg/dL (ref 70–130)
TRIGLYCERIDES: 118 mg/dL (ref 0–150)
VLDL CHOLESTEROL CAL: 23.6 mg/dL (ref 11–40)

## 2021-03-04 LAB — COMPREHENSIVE METABOLIC PANEL
ALBUMIN: 3.2 g/dL — ABNORMAL LOW (ref 3.4–5.0)
ALKALINE PHOSPHATASE: 245 U/L — ABNORMAL HIGH (ref 46–116)
ALT (SGPT): 25 U/L (ref 10–49)
ANION GAP: 4 mmol/L — ABNORMAL LOW (ref 5–14)
AST (SGOT): 29 U/L (ref <=34)
BILIRUBIN TOTAL: 0.3 mg/dL (ref 0.3–1.2)
BLOOD UREA NITROGEN: 16 mg/dL (ref 9–23)
BUN / CREAT RATIO: 7
CALCIUM: 8.8 mg/dL (ref 8.7–10.4)
CHLORIDE: 98 mmol/L (ref 98–107)
CO2: 29 mmol/L (ref 20.0–31.0)
CREATININE: 2.27 mg/dL — ABNORMAL HIGH
EGFR CKD-EPI AA FEMALE: 27 mL/min/{1.73_m2} — ABNORMAL LOW (ref >=60)
EGFR CKD-EPI NON-AA FEMALE: 24 mL/min/{1.73_m2} — ABNORMAL LOW (ref >=60)
GLUCOSE RANDOM: 250 mg/dL — ABNORMAL HIGH (ref 70–99)
POTASSIUM: 4 mmol/L (ref 3.4–4.8)
PROTEIN TOTAL: 6.7 g/dL (ref 5.7–8.2)
SODIUM: 131 mmol/L — ABNORMAL LOW (ref 135–145)

## 2021-03-04 LAB — T4, FREE: FREE T4: 0.64 ng/dL — ABNORMAL LOW (ref 0.89–1.76)

## 2021-03-04 LAB — PHOSPHORUS: PHOSPHORUS: 4.5 mg/dL (ref 2.4–5.1)

## 2021-03-04 LAB — TSH: THYROID STIMULATING HORMONE: 2.878 u[IU]/mL (ref 0.550–4.780)

## 2021-03-04 MED ORDER — LEVOTHYROXINE 25 MCG TABLET
ORAL_TABLET | Freq: Every day | ORAL | 2 refills | 20 days | Status: CP
Start: 2021-03-04 — End: 2021-06-02

## 2021-03-04 NOTE — Unmapped (Signed)
ASSESSMENT/PLAN:    Problem List Items Addressed This Visit        Cardiovascular and Mediastinum    Primary hypertension - Primary (Chronic)     BP still high today at 173/85. Patient has been taking 90mg  nifedipine since Sat 3/12 (around 4 days). Discussed option to wait until at least 1 week on board to reassess. Do not want to increase metoprolol dosing due to bradycardia.   Reviewed E-consult from Dr. Nestor Lewandowsky (3/14). Indicated that since potassium is OK, could consider addition of ARB for better control. Discussed with patient, who expressed fatigue with recent medication changes and would like to wait until next visit to evaluate necessity of ARB.               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 Plans to request that records of these labs are released to [facility] at next dialysis appt (3/18). At future visit, can decide if additional nutrient assessments needed.         Hyperphosphatemia (Chronic)      Other Visit Diagnoses     Hypothyroidism, unspecified type        Hypertension, unspecified type        Hyperlipidemia, unspecified hyperlipidemia type             Patient had labs drawn today as follow-up to virtual visit w/ Dr. Selena Batten Plans to request that dialysis releases her labs to [facility].     Following with psychiatrist-- feels mental health is stable and improving    Discussed mammogram - she'd prefer to hold off for now        HEALTH MAINTENANCE ITEMS STILL DUE:  Health Maintenance Due   Topic Date Due   ??? Retinal Eye Exam  Never done   ??? DTaP/Tdap/Td Vaccines (1 - Tdap) Never done   ??? Mammogram Start Age 54  Never done   ??? Zoster Vaccines (1 of 2) Never done       Follow-up: No follow-ups on file.    Future Appointments   Date Time Provider Department Center   03/12/2021  4:30 PM [name], MD [facility]   03/18/2021  2:30 PM Antony Contras [facility], PMHNP [id number]   03/23/2021 10:20 AM Karle Starch, MD [facility]   04/01/2021  1:00 PM Edger House, MD [facility]   04/02/2021 11:45 AM MM [facility] RM 1 [facility] [place] [facility]   04/02/2021  1:00 PM Deneise Lever, MD [facility]       Chief Complaint   Patient presents with   ??? Follow-up       SUBJECTIVE:    Alexandra Villarreal is a 55 y.o. female that presents to clinic today regarding the following issues:    -Depression being followed     #HTN  -Option to add ARB (Nephr consult)   Don't want to inc metoprolol due to low pulse   Can either wait and see if nifedipine manages more or add ARB   Next set of labs potentially needs post-bypass panel     Hoping to transfer labs from dialysis - fill out form to release       Review of Systems as above      Alexandra Villarreal  reports that she has been smoking cigarettes. She has been smoking about 1.00 pack per day.  She has never used smokeless tobacco.    OBJECTIVE:    Alexandra Villarreal  weight is 80.7 kg (178 lb). Her temporal temperature is 36.1 ??C (97 ??F). Her blood pressure is 173/85 and her pulse is 56.      BP Readings from Last 3 Encounters:   03/04/21 173/85   03/02/21 156/87   02/27/21 166/94       Wt Readings from Last 3 Encounters:   03/04/21 80.7 kg (178 lb)   02/27/21 80.7 kg (178 lb)   02/18/21 82.9 kg (182 lb 12.8 oz)      PHQ-9 PHQ-9 TOTAL SCORE   02/11/2021 15   09/22/2016 5     P     Physical Exam

## 2021-03-04 NOTE — Unmapped (Addendum)
BP still high today at 173/85. Patient has been taking 90mg  nifedipine since Sat 3/12 (around 4 days). Discussed option to wait until at least 1 week on board to reassess. Do not want to increase metoprolol dosing due to bradycardia.   Reviewed E-consult from Dr. Nestor Lewandowsky (3/14). Indicated that since potassium is OK, could consider addition of ARB for better control. Discussed with patient, who expressed fatigue with recent medication changes and would like to wait until next visit to evaluate necessity of ARB.

## 2021-03-04 NOTE — Unmapped (Addendum)
Patient is getting labs monthly at dialysis. Plans to request that records of these labs are released to [facility] at next dialysis appt (3/18). At future visit, can decide if additional nutrient assessments needed.

## 2021-03-09 ENCOUNTER — Ambulatory Visit
Admission: EM | Admit: 2021-03-09 | Discharge: 2021-03-09 | Disposition: A | Discharge status: Home / Self Care | Payer: Medicare Other | Attending: Sports Medicine | Admitting: Sports Medicine

## 2021-03-09 ENCOUNTER — Ambulatory Visit (INDEPENDENT_AMBULATORY_CARE_PROVIDER_SITE_OTHER): Payer: Medicare Other

## 2021-03-09 ENCOUNTER — Other Ambulatory Visit: Payer: Self-pay

## 2021-03-09 DIAGNOSIS — R0989 Other specified symptoms and signs involving the circulatory and respiratory systems: Secondary | ICD-10-CM | POA: Insufficient documentation

## 2021-03-09 DIAGNOSIS — R0981 Nasal congestion: Secondary | ICD-10-CM | POA: Diagnosis not present

## 2021-03-09 DIAGNOSIS — R519 Headache, unspecified: Secondary | ICD-10-CM | POA: Diagnosis present

## 2021-03-09 DIAGNOSIS — R110 Nausea: Secondary | ICD-10-CM | POA: Diagnosis present

## 2021-03-09 DIAGNOSIS — R059 Cough, unspecified: Secondary | ICD-10-CM

## 2021-03-09 DIAGNOSIS — Z1152 Encounter for screening for COVID-19: Secondary | ICD-10-CM | POA: Diagnosis not present

## 2021-03-09 DIAGNOSIS — N186 End stage renal disease: Principal | ICD-10-CM

## 2021-03-09 DIAGNOSIS — Z992 Dependence on renal dialysis: Principal | ICD-10-CM

## 2021-03-09 DIAGNOSIS — Z794 Long term (current) use of insulin: Principal | ICD-10-CM

## 2021-03-09 DIAGNOSIS — E1122 Type 2 diabetes mellitus with diabetic chronic kidney disease: Principal | ICD-10-CM

## 2021-03-09 DIAGNOSIS — I10 Essential (primary) hypertension: Principal | ICD-10-CM

## 2021-03-09 HISTORY — DX: Type 2 diabetes mellitus without complications: E11.9

## 2021-03-09 HISTORY — DX: Disorder of kidney and ureter, unspecified: N28.9

## 2021-03-09 HISTORY — DX: Essential (primary) hypertension: I10

## 2021-03-09 HISTORY — DX: Heart disease, unspecified: I51.9

## 2021-03-09 HISTORY — DX: Disorder of thyroid, unspecified: E07.9

## 2021-03-09 MED ORDER — PREDNISONE 10 MG PO TABS: ORAL_TABLET | ORAL | 0 refills | Status: DC

## 2021-03-09 MED ORDER — AZITHROMYCIN 250 MG PO TABS: 250.0000 mg | ORAL_TABLET | Freq: Every day | ORAL | 0 refills | Status: DC

## 2021-03-09 MED ORDER — BENZONATATE 100 MG PO CAPS: 200.0000 mg | ORAL_CAPSULE | Freq: Three times a day (TID) | ORAL | 0 refills | Status: DC | PRN

## 2021-03-09 NOTE — Unmapped (Signed)
[facility] Population Health   Care Management Progress Note             Date of Service:  03/09/2021      Service:  Care Management - phone    Purpose of contact:         Called pt to f/u on MyChart message regarding: Chest cold    Pt reported recurrence of chest cold like that from October 2021 onset yesterday w/ sx of cough, chest pain, SOB, HA, and loss of appetite. Advised pt to visit local UC or [facility], pt will ask daughter and DIL for transportation assistance.    Provider/Care Partner(s)  to follow up on:   N/A    Additional Information/Plan:  Patient provided my direct contact information and encouraged to contact me should additional needs arise.    Minutes spent providing outreach: 7    Estrella Deeds (she/her)  Claudia Desanctis Fellow, [employer] Team  [facility]  [phone]

## 2021-03-09 NOTE — ED Triage Notes (Signed)
Patient presents to Urgent Care with complaints of cough, sore throat, nausea, chest discomfort with cough since yesterday.. She reports today she developed bilateral ear discomfort. She had a rapid negative covid test today. She states she has a hx of bronchitis. Treating symptoms with Nyquil and Tylenol.   She denies fever, vomiting, or diarrhea.

## 2021-03-09 NOTE — Discharge Instructions (Addendum)
Your chest x-ray shows no active cardiopulmonary disease.  Given your presentation on examination I am going to treat you with an antibiotic, a cough suppressant, and a Z-Pak. Your Covid test was pending at the time of discharge. Please see educational handouts. I have prescribed Tessalon Perles to be used as directed for cough. You can use over-the-counter Robitussin or Delsym to supplement this. I prescribed you prednisone taper. I want you to use your albuterol inhaler. I also want you to use Mucinex 1200 mg twice a day without the DM component.  You can pick that up over-the-counter. Please schedule a follow-up appointment with your primary care physician as soon as possible. If your symptoms worsen in any way please call 911 or go to the ER.  I hope you get to feeling better, Dr. Zachery Dauer

## 2021-03-09 NOTE — ED Provider Notes (Signed)
[facility]    CSN: [id number] Arrival date & time: 03/09/21  1850      History   Chief Complaint Chief Complaint  Patient presents with  . Sore Throat  . Cough    HPI Nicole Sheppard is a 55 y.o. female.   Patient is a pleasant 55 year old female who presents for evaluation of the above issues.  She has had her symptoms for now 2 days.  No fever shakes chills.  She has had some nausea but no vomiting or diarrhea.  She took a home COVID test today was negative.  Her main complaint is headache, cough, chest congestion, nasal congestion, sinus pressure, and bilateral ear pressure.  She denies any sick contacts.  She also denies any abdominal or urinary symptoms.  She claims to quit smoking 2 days ago when her symptoms began.  She has been vaccinated against COVID and has received the booster.  She has also had her flu shot.  She denies any significant chest pain just some chest tightness from the congestion.  No shortness of breath.  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  Review of her labs indicated a creatinine greater than 4.  No red flag signs or symptoms elicited on history.     Past Medical History:  Diagnosis Date  . Diabetes mellitus without complication (HCC)   . Heart disease   . Hypertension   . Kidney disease   . Thyroid disease     There are no problems to display for this patient.   Past Surgical History:  Procedure Laterality Date  . ABDOMINAL HYSTERECTOMY    . AV FISTULA PLACEMENT    . BACK SURGERY    . COSMETIC SURGERY    . GALLBLADDER SURGERY    . GASTRIC BYPASS    . HERNIA REPAIR      OB History   No obstetric history on file.      Home Medications    Prior to Admission medications   Medication Sig Start Date End Date Taking? Authorizing Provider  ALBUTEROL IN 2 puff 2 TIMES DAILY (route: inhalation) 01/22/21  Yes [provider]  Aspirin Buf,CaCarb-MgCarb-MgO, 81 MG TABS Take 1 tablet  by mouth daily. 05/09/18  Yes [provider]  azithromycin (ZITHROMAX) 250 MG tablet Take 1 tablet (250 mg total) by mouth daily. Take first 2 tablets together, then 1 every day until finished. 03/09/21  Yes Delton See, MD  benzonatate (TESSALON) 100 MG capsule Take 2 capsules (200 mg total) by mouth 3 (three) times daily as needed for cough. 03/09/21  Yes Delton See, MD  bumetanide (BUMEX) 1 MG tablet 2 tablet 2 TIMES DAILY (route: oral) 08/25/20  Yes [provider]  CLONAZEPAM PO 0.5 tablet AS DIRECTED (route: oral) 12/11/20  Yes [provider]  fluticasone (FLONASE ALLERGY RELIEF) 50 MCG/ACT nasal spray 1 spray DAILY (route: nasal) 01/22/21  Yes [provider]  insulin glargine (LANTUS SOLOSTAR) 100 UNIT/ML Solostar Pen 2 unit BEDTIME (route: subcutaneous) 01/15/21  Yes [provider]  levothyroxine (SYNTHROID) 75 MCG tablet 1 tablet DAILY (route: oral) 01/15/21  Yes [provider]  metoprolol tartrate (LOPRESSOR) 100 MG tablet 1 tablet 2 TIMES DAILY (route: oral) 11/03/20  Yes [provider]  predniSONE (DELTASONE) 10 MG tablet Take 4 tablets by mouth for 3 days, then 3 tablets by mouth for 3 days, then 2 tablets by mouth for 3 days, then 1 tablet by mouth for 3 days 03/09/21  Yes Delton See, MD  INSULIN LISPRO [place] Inject into the skin.    [provider]    Family History Family History  Problem Relation Age of Onset  . Hypertension Mother   . Diabetes Mother   . Cancer Mother   . Hypertension Father   . Diabetes Father   . Heart failure Father     Social History Social History   Tobacco Use  . Smoking status: Former Smoker    Quit date: 03/07/2021  . Smokeless tobacco: Never Used  Vaping Use  . Vaping Use: Never used  Substance Use Topics  . Alcohol use: Never  . Drug use: Never     Allergies   Penicillins, Morphine, and Nsaids   Review of Systems Review of Systems  Constitutional:  Negative.  Negative for activity change, appetite change, chills, diaphoresis, fatigue and fever.  HENT: Positive for congestion, ear pain, postnasal drip, sinus pressure and sore throat. Negative for ear discharge, rhinorrhea and sinus pain.   Respiratory: Positive for cough.   Cardiovascular: Negative.  Negative for chest pain and palpitations.  Gastrointestinal: Positive for nausea. Negative for abdominal distention, abdominal pain, blood in stool, constipation, diarrhea and vomiting.  Genitourinary: Negative.  Negative for dysuria and hematuria.  Musculoskeletal: Negative.  Negative for arthralgias, back pain, gait problem and myalgias.  Skin: Negative for color change, rash and wound.  Neurological: Positive for headaches. Negative for dizziness, seizures, syncope, facial asymmetry, speech difficulty and light-headedness.  All other systems reviewed and are negative.    Physical Exam Triage Vital Signs ED Triage Vitals  Enc Vitals Group     BP 03/09/21 1927 135/71     Pulse Rate 03/09/21 1927 62     Resp 03/09/21 1927 16     Temp 03/09/21 1927 98.7 F (37.1 C)     Temp Source 03/09/21 1927 Oral     SpO2 03/09/21 1927 95 %     Weight 03/09/21 1915 171 lb 1.2 oz (77.6 kg)     Height --      Head Circumference --      Peak Flow --      Pain Score 03/09/21 1914 0     Pain Loc --      Pain Edu? --      Excl. in GC? --    No data found.  Updated Vital Signs BP 135/71 (BP Location: Right Arm) Comment: BP restrictions on Left arm  Pulse 62   Temp 98.7 F (37.1 C) (Oral)   Resp 16   Wt 77.6 kg   SpO2 95%   Visual Acuity Right Eye Distance:   Left Eye Distance:   Bilateral Distance:    Right Eye Near:   Left Eye Near:    Bilateral Near:     Physical Exam Vitals reviewed.  Constitutional:      General: She is not in acute distress.    Appearance: She is well-developed. She is not toxic-appearing.  HENT:     Head: Normocephalic and atraumatic.     Right Ear:  Tympanic membrane normal.     Left Ear: Tympanic membrane normal.     Nose: Congestion present. No rhinorrhea.     Mouth/Throat:     Mouth: Mucous membranes are moist. No oral lesions.     Pharynx: No pharyngeal swelling or posterior oropharyngeal erythema.     Tonsils: No tonsillar exudate or tonsillar abscesses. 1+ on the right. 1+ on the left.  Eyes:  Extraocular Movements:     Right eye: Normal extraocular motion.     Left eye: Normal extraocular motion.     Conjunctiva/sclera: Conjunctivae normal.     Pupils: Pupils are equal, round, and reactive to light.  Cardiovascular:     Rate and Rhythm: Normal rate and regular rhythm.     Heart sounds: Normal heart sounds. No murmur heard. No friction rub. No gallop.   Pulmonary:     Effort: Pulmonary effort is normal. No respiratory distress.     Breath sounds: No stridor. Wheezing and rales present. No rhonchi.  Abdominal:     Palpations: Abdomen is soft.     Tenderness: There is no abdominal tenderness. There is no guarding or rebound.  Musculoskeletal:     Cervical back: Normal range of motion and neck supple.  Lymphadenopathy:     Cervical: Cervical adenopathy present.  Skin:    General: Skin is warm and dry.     Capillary Refill: Capillary refill takes less than 2 seconds.     Coloration: Skin is not pale.     Findings: No erythema or rash.  Neurological:     General: No focal deficit present.     Mental Status: She is alert and oriented to person, place, and time.      UC Treatments / Results  Labs (all labs ordered are listed, but only abnormal results are displayed) Labs Reviewed  SARS CORONAVIRUS 2 (TAT 6-24 HRS)    EKG   Radiology DG Chest 2 View  Result Date: 03/09/2021 CLINICAL DATA:  55 year old female with cough. EXAM: CHEST - 2 VIEW COMPARISON:  None. FINDINGS: The lungs are clear. There is no pleural effusion pneumothorax. The cardiac silhouette is within limits. Median sternotomy wires and CABG  vascular clips. No acute osseous pathology. IMPRESSION: No active cardiopulmonary disease. Electronically Signed   By: Elgie Collard M.D.   On: 03/09/2021 20:45    Procedures Procedures (including critical care time)  Medications Ordered in UC Medications - No data to display  Initial Impression / Assessment and Plan / UC Course  I have reviewed the triage vital signs and the nursing notes.  Pertinent labs & imaging results that were available during my care of the patient were reviewed by me and considered in my medical decision making (see chart for details).  Clinical impression: 55 year old female with cough, sore throat, nausea, chest tightness for 2 days.  Did have a negative rapid Covid at home.  Complicating her situation is that she does have renal failure requiring dialysis.  That is stable on presentation today.  Treatment: 1 the findings and treatment plan were discussed in detail with the patient.  Patient was in agreement. 2.  Recommended we get a chest x-ray.  Results are above.  No active cardiopulmonary disease. 3.  Given the wheezing and the rales on examination we will treat accordingly. 4.  Also recommended we do a Covid test here.  We will send it to the hospital.  Was pending at the time of discharge.  Someone will contact her if her results are positive.  I have encouraged her to follow along in MyChart. 5.  Educational handouts provided. 6.  Prescribed a prednisone taper, Tessalon Perles, and a Z-Pak to cover atypicals.  She has an albuterol inhaler and I have encouraged her to use it.  I also want her to use Mucinex without the DM component 1200 mg twice a day. 7.  Over-the-counter meds as needed, Tylenol or  Motrin for any fever or discomfort. 8.  I encouraged her to follow-up with her primary care physician if her symptoms persisted.  Certainly if they worsen she needs to go to the ER. 9.  I also encouraged her to contact her nephrologist just to let them know  that she was seen in the urgent care and prescribed a medication that I did today.  It was not renal dose given the fact that she does get hemodialysis. 10.  Follow-up here as needed.    Final Clinical Impressions(s) / UC Diagnoses   Final diagnoses:  Encounter for screening for COVID-19  Cough  Chest congestion  Nasal congestion  Acute nonintractable headache, unspecified headache type  Nausea without vomiting     Discharge Instructions     Your chest x-ray shows Your Covid test was pending at the time of discharge. Please see educational handouts. I have prescribed Tessalon Perles to be used as directed for cough. You can use over-the-counter Robitussin or Delsym to supplement this. I prescribed you prednisone taper. I want you to use your albuterol inhaler. I also want you to use Mucinex 1200 mg twice a day without the DM component.  You can pick that up over-the-counter. Please schedule a follow-up appointment with your primary care physician as soon as possible. If your symptoms worsen in any way please call 911 or go to the ER.  I hope you get to feeling better, Dr. Zachery DauerBarnes      ED Prescriptions    Medication Sig Dispense Auth. Provider   benzonatate (TESSALON) 100 MG capsule Take 2 capsules (200 mg total) by mouth 3 (three) times daily as needed for cough. 30 capsule Delton SeeBarnes, Lithzy Bernard, MD   predniSONE (DELTASONE) 10 MG tablet Take 4 tablets by mouth for 3 days, then 3 tablets by mouth for 3 days, then 2 tablets by mouth for 3 days, then 1 tablet by mouth for 3 days 30 tablet Delton SeeBarnes, Ailish Prospero, MD   azithromycin (ZITHROMAX) 250 MG tablet Take 1 tablet (250 mg total) by mouth daily. Take first 2 tablets together, then 1 every day until finished. 6 tablet Delton SeeBarnes, Mckinzee Spirito, MD     PDMP not reviewed this encounter.   Delton SeeBarnes, Kristapher [place], MD 03/16/21 1047

## 2021-03-10 LAB — SARS CORONAVIRUS 2 (TAT 6-24 HRS): SARS Coronavirus 2: NEGATIVE

## 2021-03-10 MED ORDER — SERTRALINE 50 MG TABLET
ORAL_TABLET | Freq: Every day | ORAL | 0 refills | 30 days | Status: CP
Start: 2021-03-10 — End: ?

## 2021-03-11 DIAGNOSIS — Z992 Dependence on renal dialysis: Principal | ICD-10-CM

## 2021-03-11 DIAGNOSIS — E1122 Type 2 diabetes mellitus with diabetic chronic kidney disease: Principal | ICD-10-CM

## 2021-03-11 DIAGNOSIS — I10 Essential (primary) hypertension: Principal | ICD-10-CM

## 2021-03-11 DIAGNOSIS — N186 End stage renal disease: Principal | ICD-10-CM

## 2021-03-11 DIAGNOSIS — Z794 Long term (current) use of insulin: Principal | ICD-10-CM

## 2021-03-11 NOTE — Unmapped (Signed)
[facility] Population Health   Care Management Progress Note             Date of Service:  03/11/2021      Service:  Care Management - phone    Purpose of contact:         Called pt for CCM outreach regarding: UC visit f/u  Outcome: LVM    Additional Information/Plan:  Patient provided my direct contact information and encouraged to contact me should additional needs arise.    Minutes spent providing outreach: 2    Alexandra Villarreal (she/her)  Claudia Desanctis Fellow, [employer] Team  [facility]  [phone]

## 2021-03-12 ENCOUNTER — Ambulatory Visit
Admit: 2021-03-12 | Payer: MEDICARE | Attending: Public Health & General Preventive Medicine | Primary: Public Health & General Preventive Medicine

## 2021-03-18 ENCOUNTER — Telehealth
Admit: 2021-03-18 | Discharge: 2021-03-19 | Payer: MEDICARE | Attending: Psychiatric/Mental Health | Primary: Psychiatric/Mental Health

## 2021-03-18 DIAGNOSIS — F331 Major depressive disorder, recurrent, moderate: Principal | ICD-10-CM

## 2021-03-18 DIAGNOSIS — F17200 Nicotine dependence, unspecified, uncomplicated: Principal | ICD-10-CM

## 2021-03-18 NOTE — Unmapped (Signed)
Received mychart message from patient regarding URI symptoms: worsening cough, green/purulent sputum production, fever, and fluid build-up after completing a course of antibiotics from urgent care last week. Denies chest pain, dyspnea or lightheadedness. Pt without hx of COPD but long hx of smoking, concerning for underlying lung disease. Given her multiple co-morbidities and persistent to worsening symptoms, feel that she needs urgent evaluation for underlying etiology of her symptoms - pna vs abscess vs reactive airway vs pulmonary effusion. As she was not reporting dyspnea or respiratory distress, advised patient to see provider in our clinic for same day visit tomorrow. I messaged our clinic clerk to expedite the logistics.  Advised patient to seek care immediately at an urgent care or ED if symptoms worsen overnight or if she is unable to be seen tomorrow in clinic.    Janene Madeira, MD PGY1

## 2021-03-18 NOTE — Unmapped (Signed)
Follow-up instructions:  -- Please continue taking your medications as prescribed for your mental health.   -- Do not make changes to your medications, including taking more or less than prescribed, unless under the supervision of your physician. Be aware that some medications may make you feel worse if abruptly stopped  -- Please refrain from using illicit substances, as these can affect your mood and could cause anxiety or other concerning symptoms.   -- Seek further medical care for any increase in symptoms or new symptoms such as thoughts of wanting to hurt yourself or hurt others.     Contact info:  Life-threatening emergencies: call 911 or go to the nearest ER for medical or psychiatric attention.     Issues that need urgent attention but are not life threatening: call the clinic at [phone].    Non-urgent routine concerns, questions, and refill requests: please call the clinic for assistance.     Regarding appointments:  - If you need to cancel your appointment, we ask that you call [phone] at least 24 hours before your scheduled appointment.  - If for any reason you arrive 15 minutes later than your scheduled appointment time, you may not be seen and your visit may be rescheduled.  - Please remember that we will not automatically reschedule missed appointments.  - If you miss two (2) appointments without letting us know at least 24 hours in advance, you will likely be referred to a provider in your community.  - We will do our best to be on time. Sometimes an emergency will arise that might cause your clinician to be late. We will try to inform you of this when you check in for your appointment. If you wait more than 15 minutes past your appointment time without such notice, please speak with the front desk staff.    In the event of bad weather, the clinic staff will attempt to contact you, should your appointment need to be rescheduled. Additionally, you can call the Patient Weather Line [phone] for system-wide clinic status    For more information and reminders regarding clinic policies (these were provided when you were admitted to the clinic), please ask the front desk.    Rigel Filsinger, MPH, PMHNP-BC  Department of Psychiatry  [facility]  [address]  [address]  [place], [place] [address]  Phone: [phone]

## 2021-03-18 NOTE — Unmapped (Unsigned)
[facility] at [facility]    Assessment/Plan:       Alexandra Villarreal is a 55 y.o. female who presented with the following:    Problem List Items Addressed This Visit     None          Personal preventative health goals:  Goals    None         Follow up: No follow-ups on file.    Visit length today was --min with > 50% of time spent in face to face evaluation and coordination of care.        Subjective:        Patient [id number] Alexandra Villarreal is a 55 y.o. female with PMH s/f   Patient Active Problem List   Diagnosis   ??? Type 2 diabetes mellitus with chronic kidney disease on chronic dialysis, with long-term current use of insulin (CMS-HCC)   ??? History of duodenal ulcer   ??? History of brisk upper GI bleed requiring massive transfusion - Oct 2016   ??? Chronic hepatitis C without hepatic coma (CMS-HCC)   ??? History of Roux-en-Y gastric bypass - 1998   ??? S/P cholecystectomy   ??? S/P total hysterectomy   ??? Tobacco use disorder   ??? Umbilical hernia without obstruction and without gangrene   ??? Narcotic dependence (CMS-HCC)   ??? Coronary artery disease involving native coronary artery of native heart without angina pectoris   ??? Bipolar disorder (manic depression) (CMS-HCC)   ??? Vitamin D deficiency   ??? Ventral hernia without obstruction or gangrene   ??? Polysubstance abuse (CMS-HCC)   ??? ESRD (end stage renal disease) on dialysis (CMS-HCC)   ??? Hyperphosphatemia   ??? Mitral valve annular calcification   ??? Opioid abuse with intoxication delirium (CMS-HCC)   ??? Neuropathy   ??? 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 Postoperative hypothyroidism   ??? Status post four vessel coronary artery bypass   ??? H/O percutaneous transluminal coronary angioplasty   ??? Moderate episode of recurrent major depressive disorder (CMS-HCC)   ??? Dyslipidemia   ??? Primary hypertension   ??? History of intussusception    who presents for:    1. ***    PHQ-2 Score:      PHQ-9 Score:      Edinburgh Score: {select_status_or_delete_smartlist:64641}    ROS: Pertinent review of systems is described above in HPI. All other systems reviewed and negative.     MEDS:  Prior to Admission medications    Medication Sig Start Date End Date Taking? Authorizing Provider   acetaminophen (TYLENOL) 500 MG tablet Take 2 tablets (1,000 mg total) by mouth every eight (8) hours as needed for pain. 01/15/21 02/23/21  Christiane Ha Plyler, MD   albuterol 2.5 mg /3 mL (0.083 %) nebulizer solution 3 mL.     Historical Provider, MD   aspirin (ECOTRIN) 81 MG tablet Take 1 tablet (81 mg total) by mouth daily. 02/05/21 05/06/21  Sheran Fava, MD   atorvastatin (LIPITOR) 40 MG tablet Take 1 tablet (40 mg total) by mouth daily. For cholesterol 02/26/21 02/26/22  Janene Madeira, MD   bisacodyL (DULCOLAX, BISACODYL,) 10 mg suppository Continuous.    Historical Provider, MD   bumetanide (BUMEX) 2 MG tablet Take 1 tablet (2 mg total) by mouth two (2) times a day. 02/25/21 02/25/22  Janene Madeira, MD   buPROPion (WELLBUTRIN XL) 150 MG 24 hr tablet Take 1 tablet (150 mg total) by mouth every morning.  02/25/21 03/28/21  Janene Madeira, MD   cyclobenzaprine (FLEXERIL) 5 MG tablet cyclobenzaprine 5 mg tablet   TAKE ONE TABLET BY MOUTH EVERY DAY AS NEEDED    Historical Provider, MD   diclofenac sodium (VOLTAREN) 1 % gel Apply 2 g topically four (4) times a day as needed for pain. 01/15/21 01/15/22  Orville Govern, MD   hydrOXYzine (ATARAX) 10 MG tablet Take 1 tablet (10 mg total) by mouth Three (3) times a day as needed for anxiety. 03/02/21   Antony Contras Smolko, PMHNP   insulin ASPART (NOVOLOG FLEXPEN U-100 INSULIN) 100 unit/mL (3 mL) injection pen Novolog Flexpen U-100 Insulin aspart 100 unit/mL (3 mL) subcutaneous   INJECT SUBCUTANEOUSLY TID PER SLIDING SCALE    Historical Provider, MD   insulin glargine (LANTUS) 100 unit/mL injection Inject 0.14 mL (14 Units total) under the skin nightly. 02/25/21   Janene Madeira, MD   lamoTRIgine (LAMICTAL) 100 MG tablet Take 1 tablet (100 mg total) by mouth nightly. 02/02/21 05/03/21  Rosezetta Schlatter, PMHNP   levothyroxine (SYNTHROID) 25 MCG tablet Take 1.5 tablets (37.5 mcg total) by mouth daily. 03/04/21 06/02/21  Janene Madeira, MD   melatonin 3 mg Tab Take 1 tablet (3 mg total) by mouth every evening. 01/15/21   Orville Govern, MD   metoclopramide (REGLAN) 10 MG tablet Take 10 mg by mouth daily as needed for GI discomfort.    Historical Provider, MD   metoprolol tartrate (LOPRESSOR) 100 MG tablet metoprolol tartrate 100 mg tablet   TAKE 1/2 TABLET BY MOUTH DAILY    Historical Provider, MD   NIFEdipine (PROCARDIA XL) 90 MG 24 hr tablet Take 1 tablet (90 mg total) by mouth daily. 02/25/21   Janene Madeira, MD   ondansetron (ZOFRAN) 4 MG tablet Take 4 mg by mouth every eight (8) hours as needed. 03/28/18   Historical Provider, MD   pen needle, diabetic (PEN NEEDLE) 32 gauge x 5/32 (4 mm) Ndle 120 each by Miscellaneous route Three (3) times a day with a meal. 02/25/21   Janene Madeira, MD   pregabalin (LYRICA) 100 MG capsule Take 1 capsule (100 mg total) by mouth nightly. 02/11/21 02/11/22  Colan Neptune, MD   promethazine (PHENERGAN) 25 MG tablet promethazine 25 mg tablet   TAKE ONE TABLET BY MOUTH EVERY 6 HOURS AS NEEDED    Historical Provider, MD   QUEtiapine (SEROQUEL) 300 MG tablet Take 1 tablet (300 mg total) by mouth nightly. 02/25/21 03/27/22  Janene Madeira, MD   sertraline (ZOLOFT) 50 MG tablet Take 3 tablets (150 mg total) by mouth daily. 03/10/21   Rosezetta Schlatter, PMHNP   sevelamer (RENVELA) 800 mg tablet Take 1 tablet (800 mg total) by mouth Three (3) times a day with a meal. 01/15/21 01/15/22  Orville Govern, MD   sofosbuvir-velpatasvir (EPCLUSA) 400-100 mg tablet Take 1 tablet by mouth daily. 02/23/21   Francene Castle, MD   sofosbuvir-velpatasvir (EPCLUSA) 400-100 mg tablet Take 1 tablet by mouth daily. 02/25/21   Janene Madeira, MD       ALLERGY:  Allergies   Allergen Reactions   ??? Nsaids (Non-Steroidal Anti-Inflammatory Drug) Other (See Comments)   ??? Penicillin G    ??? Morphine Itching       PMHx/PSHx/FMHx/SHx reviewed and updated in Epic.        Objective       PHYSICAL EXAM:   There were no vitals taken for this visit.  Wt  Readings from Last 3 Encounters:   03/04/21 80.7 kg (178 lb)   02/27/21 80.7 kg (178 lb)   02/18/21 82.9 kg (182 lb 12.8 oz)     GEN: well appearing, NAD  HEENT: NCAT, MMM, PEERL, non-erythematous oropharynx, no cervical lymphadenopathy, neck supple  CARD: RRR, +s1 +s2, no murmurms/rubs/gallops, no JVD, +2 radial pulses b/l  PULM: CTA b/l, no wheezes/rales/rhonchi  GI: normoactive BS, nondistended, nontender, no guarding, no peritoneal signs, no masses, no hepatosplenomegaly  MSK: extremities warm and well perfused, +2 peripheral pulses, no edema   SKIN: no rashes  NEURO: CN 2-12 intact, 5/5 strength in UE and LE b/l, sensation intact to FT in UE and LE, normal gait  PSYCH: good hygiene, appropriate eye contact, appropriate affect, no SI/HI/AH/VH    LABS/IMAGING:  No orders of the defined types were placed in this encounter.              Mauri Reading, MD  03/18/2021  9:44 AM    [facility]  [place] [facility] [place] [facility]  [address], [place], [place] [address] ??? Telephone [phone] ??? Fax [phone]  [email or web address]

## 2021-03-18 NOTE — Unmapped (Signed)
Called to check in regarding missed appt. No answer.

## 2021-03-18 NOTE — Unmapped (Signed)
[facility]  Psychiatry   Established Patient E&M Service - Outpatient       Assessment:    Jennise Both presents for follow-up evaluation.     Identifying Information:  Alexandra Villarreal is a 55 y.o. female with a history of Bipolar disorder, OUD, cocaine use disorder.  Past mental health treatment from [facility]. Records requested.  At initial appt with this Clinical research associate, Ms. Mucha is endorsed low mood, wanting to socially isolate, low motivation. Episodes of depression often pass without requiring changes in medication and finds a lot of support in out patient therapy. Ms. Laneve has been admitted for Steal Syndrome (complication of dialysis) and start depression feels significantly worse since discharge. Primary care started bupropion for smoking cessation and hoping for additional mood benefits. Will monitor bupropion use for possible activation of mania or anxiety related to Bipolar disorder.     Risk Assessment:  A full suicide and violence risk assessment was performed as part of this patient's initial evaluation with [facility] outpatient psychiatry.  There is no new acute risk for suicide or violence at this time. The patient was educated about relevant modifiable risk factors including following recommendations for treatment of psychiatric illness and abstaining from substance abuse.   While future psychiatric events cannot be accurately predicted, the patient does not currently require acute inpatient psychiatric care and does not currently meet [facility] involuntary commitment criteria.      Plan:    Problem: Bipolar disorder  Status of problem:  new problem to this provider  Interventions:  - increase bupropion 150 mg to bupropion 300 mg daily- prescribed by primary care for smoking cessation   Discussed possible risk of side effects of bupropion including seizure, induction of mania and SI, anorexia, insomnia, dizziness, HA, agitation, hypertension, tremor, increased anxiety.     - continue lamotrigine 100 mg  Discussed possible side effects of lamictal including rash, Stevens-Johnson Syndrome, sedation, tremor, HA, GI effects, ataxia, withdrawal seizures, activation of SI, insomnia.     - continue quetiapine 200 mg qhs  Discussed possible risk of side effects for quetiapine including hyperglycemia, NMS, seizure, increased risk for diabetes and dyslipidemia, dizziness, sedation, tachycardia, orthostatic hypotension, tardive dyskinesia.     - continue sertraline 150 mg  Discussed possible risk of side effects of sertraline including seizure, induction of mania, activation of SI, sexual dysfunction, GI, sedation, HA dizziness, hyponatremia, serotonin syndrome.      - continue pregabalin 50 mg bid- prescribed by primary care    -  Continue clonazepam 0.25 mg (half tab) qhs with plan to discontinue.   Discussed possible risk of side effects of long-term use of benzodiazepines including sedation, fatigue, CNS depression, forgetfulness, depression, confusion, hepatic dysfunction, renal dysfunction, development to tolerance. Risk of fatality if overdosed. Long term use shows lack of efficacy, risk of dependence, increased risk of cognitive effects. Risk of seizure upon withdrawal.   ??   Requesting referral for therapy. Ok with individual therapy from [facility].   ??  Problem: Opiate use disorder/ cocaine use disorder  Status of problem:  new problem to this provider  Interventions: presently in remission.         Psychotherapy provided:  No billable psychotherapy service provided but brief supportive therapy was utilized.    Patient has been given this writer's contact information as well as the [facility] Psychiatry urgent line number. The patient has been instructed to call 911 for emergencies.          Subjective:  Interval History:   Reports being sick with bronchitis, but is feeling better today. Reports pain in her hand has improved since surgery. Sleep has been disruptive, waking up every two hours. Endorses daytime naps. Reinforced sleep hygiene. Has reduced cigarettes to three cigarettes a day. Finds smoking cessation difficult, does not think the Wellbutrin is beneficial, interested in increasing dosage. Plan today is to increase bupropion 150 mg to bupropion 300 mg daily. Discussed risks of bupropion including seizures, induction of mania and SI.         Objective:    Mental Status Exam:  Appearance:    Appears stated age and Clean/Neat   Motor:   No abnormal movements   Speech/Language:    Normal rate, volume, tone, fluency   Mood:   Ok   Affect:   Calm and Decreased range   Thought process and Associations:   Logical, linear, clear, coherent, goal directed   Abnormal/psychotic thought content:     Denies SI, HI, self harm, delusions, obsessions, paranoid ideation, or ideas of reference   Perceptual disturbances:     Denies auditory and visual hallucinations, behavior not concerning for response to internal stimuli     Other:            Visit was completed by video (or phone) and the appropriate disclaimer has been included below.          Rosezetta Schlatter, PMHNP        The patient reports they are currently: at home. I spent 25 minutes on the real-time audio and video with the patient on the date of service. I spent an additional 7 minutes on pre- and post-visit activities on the date of service.     The patient was physically located in [place] or a state in which I am permitted to provide care. The patient and/or parent/guardian understood that s/he may incur co-pays and cost sharing, and agreed to the telemedicine visit. The visit was reasonable and appropriate under the circumstances given the patient's presentation at the time.    The patient and/or parent/guardian has been advised of the potential risks and limitations of this mode of treatment (including, but not limited to, the absence of in-person examination) and has agreed to be treated using telemedicine. The patient's/patient's family's questions regarding telemedicine have been answered.     If the visit was completed in an ambulatory setting, the patient and/or parent/guardian has also been advised to contact their provider???s office for worsening conditions, and seek emergency medical treatment and/or call 911 if the patient deems either necessary.

## 2021-03-22 MED ORDER — BUPROPION HCL XL 150 MG 24 HR TABLET, EXTENDED RELEASE
ORAL_TABLET | Freq: Every morning | ORAL | 0 refills | 60 days | Status: CP
Start: 2021-03-22 — End: 2021-05-21

## 2021-03-23 ENCOUNTER — Ambulatory Visit: Admit: 2021-03-23 | Discharge: 2021-03-24 | Payer: MEDICARE

## 2021-03-23 NOTE — Unmapped (Signed)
Thank you for choosing [facility] in [place] for your care.     It was a pleasure seeing you today. I hope I met your needs and expectations.    A summary of today's visit:   - Discussed BP management. Please start losartan 25mg  daily once you receive it.   - SW helped with Medicaid transportation   - Please use the provided resources to find a therapist   - Please continue to take your prescriptions medications      If you need to schedule an appointment or get a message to me: Please go to [email or web address] and sign in to your [facility] Chart or call us at [phone]. I do my best to respond in 2-3 business days.      If you need to request medication refills: Please request a refill via [facility] Chart at [email or web address], or have your pharmacy send a request electronically or by sending a fax to [phone].      [facility]  Phone: [phone]  Website: [email or web address]    [facility])  [address] [place], [place]  Phone: [phone]  Hours: 24 hours    *For the most up-to-date information, please visit: [email or web address]     [employer]  Please contact 211 via phone or visit their website for any additional resources you may want to explore.+    Another resource: [email or web address]   - Search by insurance to find a therapist if the therapist.

## 2021-03-23 NOTE — Unmapped (Addendum)
-   Dialysis MWF   - Has not missed any sessions lately   - Usually uses Medicaid transportation bus   - Had Shanda Bumps SW speak with patient about additional transportation options via Medicaid

## 2021-03-23 NOTE — Unmapped (Signed)
-   Attempted to perform retinal eye exam today, but unable to get images of left eye   - Patient has upcoming appointment to have eyes examined because due for new glasses

## 2021-03-23 NOTE — Unmapped (Signed)
-   PHQ-9 Score: PHQ-9 TOTAL SCORE: 16   Screening complete, depression identified / today's follow-up action documented in note    - GAD-7 Score: GAD-7 Total Score: 12   - Has a Psychiatrist   - Has been searching for a Therapist   - Provided resources for finding a therapist   - No SI/HI   - Protective factors: family, church    - Planning to resume going to church    - Received call from daughter that pregnant with 9th grandchild!   - Current Tx: Wellbutrin, Zoloft, Seroquel   - Plan: follow up with PCP regarding dose adjustments

## 2021-03-23 NOTE — Unmapped (Signed)
[facility] Clinical Assessment & Refill Coordination Note    Alexandra Villarreal, DOB: 01-Nov-1966  Phone: [phone] (home)     All above HIPAA information was verified with patient.     Was a Nurse, learning disability used for this call? No    Specialty Medication(s):   Infectious Disease: Epclusa     Current Outpatient Medications   Medication Sig Dispense Refill   ??? albuterol 2.5 mg /3 mL (0.083 %) nebulizer solution 3 mL.      ??? aspirin (ECOTRIN) 81 MG tablet Take 1 tablet (81 mg total) by mouth daily. 90 tablet 0   ??? atorvastatin (LIPITOR) 40 MG tablet Take 1 tablet (40 mg total) by mouth daily. For cholesterol 30 tablet 11   ??? bisacodyL (DULCOLAX, BISACODYL,) 10 mg suppository Continuous.     ??? bumetanide (BUMEX) 2 MG tablet Take 1 tablet (2 mg total) by mouth two (2) times a day. 60 tablet 11   ??? buPROPion (WELLBUTRIN XL) 150 MG 24 hr tablet Take 2 tablets (300 mg total) by mouth every morning. 120 tablet 0   ??? cyclobenzaprine (FLEXERIL) 5 MG tablet cyclobenzaprine 5 mg tablet   TAKE ONE TABLET BY MOUTH EVERY DAY AS NEEDED     ??? diclofenac sodium (VOLTAREN) 1 % gel Apply 2 g topically four (4) times a day as needed for pain. 200 g 11   ??? hydrOXYzine (ATARAX) 10 MG tablet Take 1 tablet (10 mg total) by mouth Three (3) times a day as needed for anxiety. 30 tablet 2   ??? insulin ASPART (NOVOLOG FLEXPEN U-100 INSULIN) 100 unit/mL (3 mL) injection pen Novolog Flexpen U-100 Insulin aspart 100 unit/mL (3 mL) subcutaneous   INJECT SUBCUTANEOUSLY TID PER SLIDING SCALE     ??? insulin glargine (LANTUS) 100 unit/mL injection Inject 0.14 mL (14 Units total) under the skin nightly. 15 mL 2   ??? lamoTRIgine (LAMICTAL) 100 MG tablet Take 1 tablet (100 mg total) by mouth nightly. 90 tablet 0   ??? levothyroxine (SYNTHROID) 25 MCG tablet Take 1.5 tablets (37.5 mcg total) by mouth daily. 30 tablet 2   ??? melatonin 3 mg Tab Take 1 tablet (3 mg total) by mouth every evening. 60 tablet 11   ??? metoclopramide (REGLAN) 10 MG tablet Take 10 mg by mouth daily as needed for GI discomfort.     ??? metoprolol tartrate (LOPRESSOR) 100 MG tablet metoprolol tartrate 100 mg tablet   TAKE 1/2 TABLET BY MOUTH DAILY     ??? NIFEdipine (PROCARDIA XL) 90 MG 24 hr tablet Take 1 tablet (90 mg total) by mouth daily. 30 tablet 5   ??? ondansetron (ZOFRAN) 4 MG tablet Take 4 mg by mouth every eight (8) hours as needed.     ??? pen needle, diabetic (PEN NEEDLE) 32 gauge x 5/32 (4 mm) Ndle 120 each by Miscellaneous route Three (3) times a day with a meal. 120 each 3   ??? pregabalin (LYRICA) 100 MG capsule Take 1 capsule (100 mg total) by mouth nightly. 30 capsule 2   ??? promethazine (PHENERGAN) 25 MG tablet promethazine 25 mg tablet   TAKE ONE TABLET BY MOUTH EVERY 6 HOURS AS NEEDED     ??? QUEtiapine (SEROQUEL) 300 MG tablet Take 1 tablet (300 mg total) by mouth nightly. 90 tablet 4   ??? sertraline (ZOLOFT) 50 MG tablet Take 3 tablets (150 mg total) by mouth daily. 90 tablet 0   ??? sevelamer (RENVELA) 800 mg tablet Take 1  tablet (800 mg total) by mouth Three (3) times a day with a meal. 90 tablet 11   ??? sofosbuvir-velpatasvir (EPCLUSA) 400-100 mg tablet Take 1 tablet by mouth daily. 28 tablet 2   ??? sofosbuvir-velpatasvir (EPCLUSA) 400-100 mg tablet Take 1 tablet by mouth daily. 28 tablet 2     No current facility-administered medications for this visit.        Changes to medications: [name] reports no changes at this time.    Allergies   Allergen Reactions   ??? Nsaids (Non-Steroidal Anti-Inflammatory Drug) Other (See Comments)   ??? Penicillin G    ??? Morphine Itching       Changes to allergies: No    SPECIALTY MEDICATION ADHERENCE     Epclusa 400-100 mg: 6 days of medicine on hand including dose for today      Medication Adherence    Patient reported X missed doses in the last month: 0  Specialty Medication: Epclusa 400-100mg   Patient is on additional specialty medications: No  Demonstrates understanding of importance of adherence: yes  Informant: patient  Provider-estimated medication adherence level: good  Patient is at risk for Non-Adherence: No  Confirmed plan for next specialty medication refill: delivery by pharmacy  Refills needed for supportive medications: yes, ordered or provider notified          Specialty medication(s) dose(s) confirmed: Regimen is correct and unchanged.     Are there any concerns with adherence? No    Adherence counseling provided? Not needed    CLINICAL MANAGEMENT AND INTERVENTION      Clinical Benefit Assessment:    Do you feel the medicine is effective or helping your condition? Yes    Clinical Benefit counseling provided? Not needed    Adverse Effects Assessment:    Are you experiencing any side effects? Yes she is having fatigue and an intermittent headache    Are you experiencing difficulty administering your medicine? No    Quality of Life Assessment:    How many days over the past month did your Hepatitis C  keep you from your normal activities? For example, brushing your teeth or getting up in the morning. 0    Have you discussed this with your provider? Not needed    Acute Infection Status:    Acute infections noted within Epic:  No active infections  Patient reported infection: None    Therapy Appropriateness:    Is therapy appropriate? Yes, therapy is appropriate and should be continued    DISEASE/MEDICATION-SPECIFIC INFORMATION      For Hepatitis C patients (clinical assessment):  Regimen: Epclusa x 12 weeks  Therapy start date: 03/01/21  Completed Treatment Week #: 3  What time of day do you take your medicine? Evening  Are you taking any new OTC or herbal medication? No  Any alcoholic beverages? No  Do you have a follow up appointment? No, appointment is not scheduled, I notified clinic     PATIENT SPECIFIC NEEDS     - Does the patient have any physical, cognitive, or cultural barriers? No    - Is the patient high risk? No    - Does the patient require a Care Management Plan? No     - Does the patient require physician intervention or other additional services (i.e. nutrition, smoking cessation, social work)? No      SHIPPING     Specialty Medication(s) to be Shipped:   Infectious Disease: Epclusa    Other medication(s) to be shipped: atoravastatin 40mg ,  melatonin, and acetaminophen: new Rx needed: request sent     Changes to insurance: No    Delivery Scheduled: Yes, Expected medication delivery date: 03/25/21.     Medication will be delivered via Same Day Courier to the confirmed prescription address in [facility].    The patient will receive a drug information handout for each medication shipped and additional FDA Medication Guides as required.  Verified that patient has previously received a Conservation officer, historic buildings and a Surveyor, mining.    All of the patient's questions and concerns have been addressed.    Roderic Palau   [facility] Specialty Pharmacist

## 2021-03-23 NOTE — Unmapped (Signed)
[facility]  Established Patient Clinic Note    Assessment & Plan  Alexandra Villarreal is a 55 y.o.female    Problem List Items Addressed This Visit        Endocrine    Type 2 diabetes mellitus with chronic kidney disease on chronic dialysis, with long-term current use of insulin (CMS-HCC) (Chronic)     - Attempted to perform retinal eye exam today, but unable to get images of left eye   - Patient has upcoming appointment to have eyes examined because due for new glasses             Cardiovascular and Mediastinum    Primary hypertension - Primary (Chronic)     - BP 174/94  - BP has been elevated including after dialysis   - Per chart review, prior e-consult with Dr. Nestor Lewandowsky (Nephrology) and can start ARB   - Rx for losartan 25mg  has been sent to the patient's pharmacy   - Plan: Start taking losartan 25mg  daily. Follow up with PCP in 1 month for recheck.             Genitourinary    ESRD (end stage renal disease) on dialysis (CMS-HCC) (Chronic)     - Dialysis MWF   - Has not missed any sessions lately   - Usually uses Medicaid transportation bus   - Had Shanda Bumps SW speak with patient about additional transportation options via Medicaid             Other    Moderate episode of recurrent major depressive disorder (CMS-HCC) (Chronic)     - PHQ-9 Score: PHQ-9 TOTAL SCORE: 16   Screening complete, depression identified / today's follow-up action documented in note    - GAD-7 Score: GAD-7 Total Score: 12   - Has a Psychiatrist   - Has been searching for a Therapist   - Provided resources for finding a therapist   - No SI/HI   - Protective factors: family, church    - Planning to resume going to church    - Received call from daughter that pregnant with 9th grandchild!   - Current Tx: Wellbutrin, Zoloft, Seroquel   - Plan: follow up with PCP regarding dose adjustments                      Attending: Dr. Leda Quail     Subjective   Alexandra Villarreal is a 55 y.o. female  coming to clinic today for the following issues:    Chief Complaint   Patient presents with   ??? Follow-up       History of Present Illness:    1. Follow Up Visit   - Off of prednisone, so blood sugars better   - Cough improving, finished course of abx    - Still coughing up phlegm, dark yellow, decreased amount    - No fevers or chills   - Has been attending dialysis    - MWF    - Using Medicaid transportation bus     2. BP   - Elevated BP after dialysis  - Has been taking Procardia 90mg    - Received a notification from pharmacy regarding losartan     3. Mood   - Has been steady   - Sometimes, I feel not needed   - No SI/HI   - Family is very important to her     ROS:    Review of Systems  Constitutional: Negative for chills, fever and malaise/fatigue.   HENT: Negative for congestion and sore throat.    Eyes: Negative for blurred vision and double vision.   Respiratory: Positive for cough. Negative for shortness of breath.    Cardiovascular: Negative for chest pain and leg swelling.   Gastrointestinal: Negative for abdominal pain, constipation, diarrhea, nausea and vomiting.   Genitourinary: Negative for dysuria.   Musculoskeletal: Negative for myalgias.   Skin: Negative for rash.   Neurological: Negative for dizziness and headaches.   Psychiatric/Behavioral: Positive for depression. Negative for suicidal ideas.       I have reviewed the problem list, medications, and allergies and have updated/reconciled them if needed.      Health Maintenance:   Health Maintenance   Topic Date Due   ??? Retinal Eye Exam  Never done   ??? DTaP/Tdap/Td Vaccines (1 - Tdap) Never done   ??? Mammogram Start Age 42  Never done   ??? Zoster Vaccines (1 of 2) Never done   ??? Hemoglobin A1c  07/28/2021   ??? Foot Exam  01/28/2022   ??? Serum Creatinine Monitoring  03/04/2022   ??? Potassium Monitoring  03/04/2022   ??? Colonoscopy  01/06/2028   ??? COVID-19 Vaccine  Completed   ??? Influenza Vaccine  Completed   ??? Pneumococcal Vaccine  Completed         Objective     VITALS: BP 174/94 Comment: avg. b/p - Pulse 61  - Temp 36.6 ??C (Temporal)  - Ht 165.1 cm (5' 5)  - Wt 78.8 kg (173 lb 12.8 oz)  - BMI 28.92 kg/m??     Physical Exam  Constitutional:       Appearance: Normal appearance.   HENT:      Head: Normocephalic and atraumatic.      Right Ear: External ear normal.      Left Ear: External ear normal.   Eyes:      Extraocular Movements: Extraocular movements intact.      Conjunctiva/sclera: Conjunctivae normal.   Cardiovascular:      Rate and Rhythm: Normal rate.      Pulses: Normal pulses.   Pulmonary:      Effort: Pulmonary effort is normal. No respiratory distress.      Breath sounds: Wheezing present.   Musculoskeletal:      Cervical back: Normal range of motion.   Skin:     General: Skin is warm and dry.   Neurological:      General: No focal deficit present.      Mental Status: She is alert and oriented to person, place, and time.   Psychiatric:         Mood and Affect: Mood normal.         Behavior: Behavior normal.         Thought Content: Thought content normal.         Judgment: Judgment normal.         LABS/IMAGING  I have reviewed pertinent recent labs and imaging in Epic      [facility]  [place] [facility]  [address] [place], [place] [address] ??? Telephone [phone] ??? Fax [phone]  [email or web address]

## 2021-03-23 NOTE — Unmapped (Signed)
[facility] Population Health   Care Management Progress Note             Date of Service:  03/23/2021      Service:  Care Management - face to face    Purpose of contact:         Discussed the following with Alexandra Villarreal:    SW was pulled by the provider to discuss transportation options with the patient. Patient receives transportation services through [facility] transport and wanted to know if there was other options. Patient also asked about being connected to a therapist. Patient had no preference for in person or telehealth, but did prefer a female. SW to send a MyChart message with resources for the patient.    Provider/Care Partner(s)  to follow up on:   N/A      Health Maintenance Due   Topic Date Due   ??? Retinal Eye Exam  Never done   ??? DTaP/Tdap/Td Vaccines (1 - Tdap) Never done   ??? Mammogram Start Age 55  Never done   ??? Zoster Vaccines (1 of 2) Never done       Additional Information/Plan:  Patient provided my direct contact information and encouraged to contact me should additional needs arise.    Minutes spent providing outreach: 10    Mahlon Gammon  Population Health  [facility]

## 2021-03-23 NOTE — Unmapped (Signed)
-   BP 174/94  - BP has been elevated including after dialysis   - Per chart review, prior e-consult with Dr. Nestor Lewandowsky (Nephrology) and can start ARB   - Rx for losartan 25mg  has been sent to the patient's pharmacy   - Plan: Start taking losartan 25mg  daily. Follow up with PCP in 1 month for recheck.

## 2021-03-23 NOTE — Unmapped (Signed)
[facility] Population Health   Care Management Progress Note             Date of Service:  03/23/2021      Service:  Care Management - mychart    Purpose of contact:         Discussed the following with [name] :    Sent mychart message to patient regarding diabetic eye exams and including list of local eye care providers per patient request.    Barriers to Care:  N/A    Provider/Care Partner(s)  to follow up on:   N/A    Health Maintenance:  Health Maintenance Due   Topic Date Due   ??? Retinal Eye Exam  Never done   ??? DTaP/Tdap/Td Vaccines (1 - Tdap) Never done   ??? Mammogram Start Age 29  Never done   ??? Zoster Vaccines (1 of 2) Never done     I addressed these health maintenance gaps with the patient: Diabetic eye exam  Plan for health maintenance gaps addressed: Patient plans to close gap outside of [facility]; has not done yet    Additional Information/Plan:  Patient provided my direct contact information and encouraged to contact me should additional needs arise.    Minutes spent providing outreach: 5    Einar Grad  Population Health  [facility]

## 2021-03-24 MED ORDER — ACETAMINOPHEN 500 MG TABLET
ORAL_TABLET | Freq: Three times a day (TID) | ORAL | 0 refills | 30 days | Status: CP | PRN
Start: 2021-03-24 — End: 2021-04-23
  Filled 2021-03-25: qty 180, 30d supply, fill #0

## 2021-03-25 MED FILL — EPCLUSA 400 MG-100 MG TABLET: ORAL | 28 days supply | Qty: 28 | Fill #1

## 2021-03-25 MED FILL — ATORVASTATIN 40 MG TABLET: ORAL | 30 days supply | Qty: 30 | Fill #1

## 2021-03-25 MED FILL — MELATONIN 3 MG TABLET: ORAL | 60 days supply | Qty: 60 | Fill #0

## 2021-03-25 NOTE — Unmapped (Signed)
I saw and evaluated the patient, participating in the key portions of the service.  I reviewed the resident???s note.  I agree with the resident???s findings and plan. Alvester Morin, MD

## 2021-03-26 ENCOUNTER — Telehealth
Admit: 2021-03-26 | Discharge: 2021-03-27 | Payer: MEDICARE | Attending: Public Health & General Preventive Medicine | Primary: Public Health & General Preventive Medicine

## 2021-03-26 NOTE — Unmapped (Signed)
Phone Visit Note    This medical encounter was conducted virtually using [email or web address]  TeleHealth protocols.      I have identified myself to the patient and conveyed my credentials to [name]  I have explained the capabilities and limitations of telemedicine and the patient/proxy and myself both agree that it is appropriate for their current circumstances/symptoms.     Contact Information  Person Contacted: Alexandra Villarreal         Contact Phone number: [phone]      Is there someone else in the room? No.      Purpose of contact:     [name] is a 55 y.o. female is participating in a telephone visit for tobacco cessation counseling.  Patient consented to telephone visit given due to social isolation measures in place due to the COVID-19 pandemic.     General Information  Program: [facility]  Type of Visit: Follow-up  Session Number: 2          Tobacco Use During Past 30 Days  Time Since Last Tobacco Use: smoked a cigarette today (at least one puff)     Type of Tobacco Products Used: Cigarettes  Quantity Used: 15  Quantity Per: day              Smoking Allowed in Home: No  Smoking Allowed in Vehicles: Yes      Still recovering from bronchitis, but doesn't feel like this has had an impact on her tobacco use. Has cut down from about a ppd to a little over 1/2 ppd    Tobacco Use History    Age Began Use (Years Old): 15  Longest Time Without Tobacco: Years  # of Hours, Days, Weeks, Months or Years:  (2)  Most Recent Attempt: 1-5 years  Medications Used in Past Attempts: Nicotine Patch    Pt has started the bupropion and is using nicotine patches intermittently.  Side Effects:  (none; hasn't tried the nicotine gum)       Behavioral Assessment  Why Uses:  (habit)  Reasons to Become Tobacco Free:  (health, daughter is pregnant)  Barriers/Challenges:  (traveling with family member who all smoke)  Strategies:  (Using patch about 3 times a week--those does usually only has 3-4 cigarettes, is open to using every other day, planning to make the car smoke free, house is already smoke free)       Treatment Plan  Cessation Meds Currently Using: Bupropion, Patch 21mg    Pt also has Rx for nicotine gum and educated about correct use today. Plans to start using patch every other day and gum sometimes as a cigarette substitute. Will continue bupropion.      Plan to Obtain Outpatient Meds: Pt already has medication        Follow-up Plan: Phone follow-up scheduled          Wrap-Up  Diagnosis: Tobacco use disorder, unspecified, uncomplicated (F17.200)  TTS Visit Length: >10 minutes     Tobacco use disorder  Overview:  On wellbutrin           Follow Up Plan & Next Steps:  1) Please continue encouraging this Pt to reach their goals (see list above).   2) []  Patient will call office to schedule follow up appointment with this provider  [x]  Scheduled follow up phone call per patient request for:Apr 29th at 4:30pm  []  Patient provided with instructions on how to reach this provider during times that the office is reducing face  to face visits due to COVID-19    As part of this Telephone Visit, no in-person exam was conducted.     I personally spent 15 minutes counseling the patient via telephone about tobacco cessation.  I spent an additional 5 minutes on pre- and post-visit activities. [name] MD     The patient was physically located in [place] or a state in which I am permitted to provide care. The patient and/or parent/guardian understood that s/he may incur co-pays and cost sharing, and agreed to the telemedicine visit. The visit was reasonable and appropriate under the circumstances given the patient's presentation at the time.     The patient and/or parent/guardian has been advised of the potential risks and limitations of this mode of treatment (including, but not limited to, the absence of in-person examination) and has agreed to be treated using telemedicine. The patient's/patient's family's questions regarding telemedicine have been answered.      If the visit was completed in an ambulatory setting, the patient and/or parent/guardian has also been advised to contact their provider???s office for worsening conditions, and seek emergency medical treatment and/or call 911 if the patient deems either necessary.     Visit Format/Coding: Telephone     Coding: 29562 (11-20 minutes)  Service rendered over the phone most consistent with: Tobacco cessation counseling, greater than 10 minutes [phone])      [facility]  [address] [place], [place] [address] ??? Telephone [phone] ??? Fax [phone]  [email or web address]

## 2021-03-27 DIAGNOSIS — F331 Major depressive disorder, recurrent, moderate: Principal | ICD-10-CM

## 2021-03-27 DIAGNOSIS — F17200 Nicotine dependence, unspecified, uncomplicated: Principal | ICD-10-CM

## 2021-03-27 MED ORDER — BUPROPION HCL XL 150 MG 24 HR TABLET, EXTENDED RELEASE
ORAL_TABLET | 0 refills | 0 days
Start: 2021-03-27 — End: ?

## 2021-03-29 NOTE — Unmapped (Signed)
Form placed in clinician's box     Alexandra Villarreal
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
## 2021-03-31 NOTE — Unmapped (Addendum)
It was great to see you today in clinic. Here is the plan as we discussed:    - I sent a refill for the Bumex medication to your pharmacy  - Overall, I will defer to your nephrology team (kidney doctors) for the blood pressure medications, but I will give them some recommendations in the meantime  - Let's plan to see you again in about 1 year and we will probably follow annual echocardiograms (heart ultrasound) to watch the valves and pump function of the heart   - If anything comes up in the meantime, then just let us know and we can we always bump the appointment sooner

## 2021-04-01 ENCOUNTER — Ambulatory Visit
Admit: 2021-04-01 | Discharge: 2021-04-02 | Payer: MEDICARE | Attending: Student in an Organized Health Care Education/Training Program | Primary: Student in an Organized Health Care Education/Training Program

## 2021-04-01 DIAGNOSIS — I10 Essential (primary) hypertension: Principal | ICD-10-CM

## 2021-04-01 DIAGNOSIS — E1122 Type 2 diabetes mellitus with diabetic chronic kidney disease: Principal | ICD-10-CM

## 2021-04-01 DIAGNOSIS — I2510 Atherosclerotic heart disease of native coronary artery without angina pectoris: Principal | ICD-10-CM

## 2021-04-01 DIAGNOSIS — Z951 Presence of aortocoronary bypass graft: Principal | ICD-10-CM

## 2021-04-01 DIAGNOSIS — Z794 Long term (current) use of insulin: Principal | ICD-10-CM

## 2021-04-01 DIAGNOSIS — Z992 Dependence on renal dialysis: Principal | ICD-10-CM

## 2021-04-01 DIAGNOSIS — I059 Rheumatic mitral valve disease, unspecified: Principal | ICD-10-CM

## 2021-04-01 DIAGNOSIS — Z9861 Coronary angioplasty status: Principal | ICD-10-CM

## 2021-04-01 DIAGNOSIS — N186 End stage renal disease: Principal | ICD-10-CM

## 2021-04-01 MED ORDER — BUMETANIDE 2 MG TABLET
ORAL_TABLET | Freq: Two times a day (BID) | ORAL | 0 refills | 30.00000 days | Status: CP
Start: 2021-04-01 — End: ?

## 2021-04-02 ENCOUNTER — Ambulatory Visit
Admit: 2021-04-02 | Discharge: 2021-04-03 | Payer: MEDICARE | Attending: Student in an Organized Health Care Education/Training Program | Primary: Student in an Organized Health Care Education/Training Program

## 2021-04-02 ENCOUNTER — Ambulatory Visit: Admit: 2021-04-02 | Discharge: 2021-04-03 | Payer: MEDICARE

## 2021-04-02 NOTE — Unmapped (Signed)
[facility] Vascular Surgery      Patient Name: Alexandra Villarreal  Encounter Date: 04/02/2021  Referring provider: Curcio  Surgeon: Corliss Blacker, MD    ASSESSMENT     55 y.o.female with hx of ESRD s/p LUE brachiobasilic AVF with steal syndrome (tissue loss) s/p RUDI 02/04/21. Now s/p fistulagram 03/02/21 showing no venous stenosis. Left hand wounds are healed, although with some mild numbness to her fingertips. Dialyzing without issue. PVL HD fistula duplex shows patent outflow with good flow volumes.      PLAN     - Follow up PRN. Reviewed return precautions.  - Continue to dialyze via LUE AVF. Okay to access more distally if needed, but prefer more proximal access.  - Reassurance; RGSV harvest site scar will likely improve over time.     HISTORY OF PRESENT ILLNESS      Alexandra Villarreal is a 55 y.o. female with history of CAD s/p CABG, T2DM, HTN, s/p Roux en Y 1998, chronic Hep C, polysubstance abuse, who returns to clinic for ESRD on HD s/p LUE brachiobasilic AVF with steal syndrome (tissue loss) s/p RUDI 02/04/21. S/p fistulagram 03/02/21 showing no venous stenosis. Since then, she reports she is doing well. She does have some persistent numbness to the left distal fingertips as well as occasional pain, but overall symptoms remain improved and she can tolerate sx. Left hand wounds have healed and she is dialyzing without issue (accessing proximally). Denies any weakness, decreased grip strength, or numbness/tingling to extremity. RGSV harvest site is healed and non-painful, but she is concerned about wide scarring.    Interventions to date:  - LUE brachiocephalic AVF creation in [place] 05/2020  - LUE diagnostic fistulagram 01/14/21  - LUE AVF revision with RUDI, RGSV harvest for steal (tissue loss) 02/04/21  - LUE diagnostic fistulagram, showing no venous stenosis 03/02/21      ROS: The balance of 10 systems reviewed is negative except as noted in the HPI     RX/ ALLERGIES/ MED HX/SURG HX/ SOC HX/FAM HX: Reviewed & updated in Epic    Current Outpatient Medications   Medication Instructions   ??? acetaminophen (TYLENOL) 1,000 mg, Oral, Every 8 hours PRN   ??? albuterol 2.5 mg /3 mL (0.083 %) nebulizer solution 3 mL   ??? aspirin (ECOTRIN) 81 mg, Oral, Daily (standard)   ??? atorvastatin (LIPITOR) 40 mg, Oral, Daily (standard), For cholesterol   ??? bisacodyL (DULCOLAX, BISACODYL,) 10 mg suppository Continuous   ??? bumetanide (BUMEX) 2 mg, Oral, 2 times a day   ??? buPROPion (WELLBUTRIN XL) 300 mg, Oral, Every morning   ??? cyclobenzaprine (FLEXERIL) 5 MG tablet cyclobenzaprine 5 mg tablet   TAKE ONE TABLET BY MOUTH EVERY DAY AS NEEDED   ??? diclofenac sodium (VOLTAREN) 2 g, Topical, 4 times a day PRN   ??? hydrOXYzine (ATARAX) 10 mg, Oral, 3 times a day PRN   ??? insulin ASPART (NOVOLOG FLEXPEN U-100 INSULIN) 100 unit/mL (3 mL) injection pen Novolog Flexpen U-100 Insulin aspart 100 unit/mL (3 mL) subcutaneous   INJECT SUBCUTANEOUSLY TID PER SLIDING SCALE   ??? insulin glargine (LANTUS) 14 Units, Subcutaneous, Nightly   ??? lamoTRIgine (LAMICTAL) 100 mg, Oral, Nightly   ??? levothyroxine (SYNTHROID) 37.5 mcg, Oral, Daily (standard)   ??? losartan (COZAAR) 25 mg, Oral, Daily (standard)   ??? melatonin 3 mg, Oral, Every evening   ??? metoclopramide (REGLAN) 10 mg, Oral, Daily PRN   ??? metoprolol tartrate (LOPRESSOR) 100 MG tablet metoprolol tartrate 100 mg  tablet   TAKE 1/2 TABLET BY MOUTH DAILY   ??? NIFEdipine (PROCARDIA XL) 90 mg, Oral, Daily (standard)   ??? ondansetron (ZOFRAN) 4 mg, Oral, Every 8 hours PRN   ??? pen needle, diabetic (PEN NEEDLE) 32 gauge x 5/32 (4 mm) Ndle 120 each, Miscellaneous, 3 times a day (with meals)   ??? pregabalin (LYRICA) 100 mg, Oral, Nightly   ??? promethazine (PHENERGAN) 25 MG tablet promethazine 25 mg tablet   TAKE ONE TABLET BY MOUTH EVERY 6 HOURS AS NEEDED   ??? QUEtiapine (SEROQUEL) 300 mg, Oral, Nightly   ??? sertraline (ZOLOFT) 150 mg, Oral, Daily (standard)   ??? sevelamer (RENVELA) 800 mg, Oral, 3 times a day (with meals)   ??? sofosbuvir-velpatasvir (EPCLUSA) 400-100 mg tablet 1 tablet, Oral, Daily (standard)   ??? sofosbuvir-velpatasvir (EPCLUSA) 400-100 mg tablet 1 tablet, Oral, Daily (standard)        PHYSICAL EXAM     Vitals:    04/02/21 1311   BP: 150/74   Pulse: 56   Temp: 36.5 ??C (97.7 ??F)     General:Well appearing female in NAD. right hand dominant  Lungs: CTA Bilaterally, normal work of breathing  Cardiac: RRR  Abdomen: Soft, non-distended, non tender, without mass  Extremities: Warm hand, cooler fingers. Palpable radial pulses, negative for edema, ulceration, digital gangrene. LUE AVF with palpable thrill, audible bruit.  MS: negative for joint tenderness  Skin: intact, good turgor. RGSV harvest site well-healed but with some wide scarring. No palpable seroma.  Neuro: AAO x 3, appropriate to questions, sensation and ROM intact    DIAGNOSTICS      Images and reports reviewed independently.    Fistula duplex (04/02/2021) - Patent outflow via axillary and subclavian veins.   ______________________________________________________________________    Documentation assistance was provided by Marrianne Mood, Scribe, on April 01, 2021 at 9:14 PM for Galen Daft, [place], and Corliss Blacker, MD

## 2021-04-02 NOTE — Unmapped (Signed)
Faxed to [phone].

## 2021-04-08 NOTE — Unmapped (Signed)
Scheduled initial virtual therapy appt for 5/3 @ 2PM

## 2021-04-09 MED ORDER — SERTRALINE 50 MG TABLET
ORAL_TABLET | Freq: Every day | ORAL | 0 refills | 90 days | Status: CP
Start: 2021-04-09 — End: 2021-07-08

## 2021-04-09 NOTE — Unmapped (Signed)
Pharmacy contacted the clinic on behalf of the patient. Pharmacy verified. Alexandra Villarreal

## 2021-04-12 ENCOUNTER — Telehealth: Admit: 2021-04-12 | Discharge: 2021-04-13 | Payer: MEDICARE | Attending: Family Medicine | Primary: Family Medicine

## 2021-04-12 DIAGNOSIS — S161XXA Strain of muscle, fascia and tendon at neck level, initial encounter: Principal | ICD-10-CM

## 2021-04-12 MED ORDER — AURYXIA 210 MG IRON TABLET
ORAL_TABLET | Freq: Three times a day (TID) | ORAL | 11 refills | 30.00000 days | Status: CP
Start: 2021-04-12 — End: 2022-04-12

## 2021-04-12 MED ORDER — CYCLOBENZAPRINE 5 MG TABLET
ORAL_TABLET | ORAL | 0 refills | 20 days | Status: CP | PRN
Start: 2021-04-12 — End: ?

## 2021-04-12 NOTE — Unmapped (Addendum)
Patient Education        Neck Strain or Sprain: Rehab Exercises  Introduction  Here are some examples of exercises for you to try. The exercises may be suggested for a condition or for rehabilitation. Start each exercise slowly. Ease off the exercises if you start to have pain.  You will be told when to start these exercises and which ones will work best for you.  How to do the exercises  Neck rotation    1. Sit in a firm chair, or stand up straight.  2. Keeping your chin level, turn your head to the right, and hold for 15 to 30 seconds.  3. Turn your head to the left and hold for 15 to 30 seconds.  4. Repeat 2 to 4 times to each side.  Neck stretches    1. Look straight ahead, and tip your right ear to your right shoulder. Do not let your left shoulder rise up as you tip your head to the right.  2. Hold for 15 to 30 seconds.  3. Tilt your head to the left. Do not let your right shoulder rise up as you tip your head to the left.  4. Hold for 15 to 30 seconds.  5. Repeat 2 to 4 times to each side.  Forward neck flexion    1. Sit in a firm chair, or stand up straight.  2. Bend your head forward.  3. Hold for 15 to 30 seconds.  4. Repeat 2 to 4 times.  Lateral (side) bend strengthening    1. With your right hand, place your first two fingers on your right temple.  2. Start to bend your head to the side while using gentle pressure from your fingers to keep your head from bending.  3. Hold for about 6 seconds.  4. Repeat 8 to 12 times.  5. Switch hands and repeat the same exercise on your left side.  Forward bend strengthening    1. Place your first two fingers of either hand on your forehead.  2. Start to bend your head forward while using gentle pressure from your fingers to keep your head from bending.  3. Hold for about 6 seconds.  4. Repeat 8 to 12 times.  Neutral position strengthening    1. Using one hand, place your fingertips on the back of your head at the top of your neck.  2. Start to bend your head backward while using gentle pressure from your fingers to keep your head from bending.  3. Hold for about 6 seconds.  4. Repeat 8 to 12 times.  Chin tuck    1. Lie on the floor with a rolled-up towel under your neck. Your head should be touching the floor.  2. Slowly bring your chin toward your chest.  3. Hold for a count of 6, and then relax for up to 10 seconds.  4. Repeat 8 to 12 times.  Follow-up care is a key part of your treatment and safety. Be sure to make and go to all appointments, and call your doctor if you are having problems. It's also a good idea to know your test results and keep a list of the medicines you take.  Where can you learn more?  Go to [facility] at [email or web address] in the Menu. Enter M679 in the search box to learn more about Neck Strain or Sprain: Rehab Exercises.  Current as of: June 18, 2020??????????????????????????????Content Version: 13.2  ??  2006-2022 [employer].   Care instructions adapted under license by [facility]. If you have questions about a medical condition or this instruction, always ask your healthcare professional. Healthwise, Incorporated disclaims any warranty or liability for your use of this information.         Patient Education        Neck Strain: Care Instructions  Your Care Instructions     You have strained the muscles and ligaments in your neck. A sudden, awkward movement can strain the neck. This often occurs with falls or car accidents or during certain sports. Everyday activities like working on a computer or sleeping can also cause neck strain if they force you to hold your neck in an awkward position for a long time.  It is common for neck pain to get worse for a day or two after an injury, but it should start to feel better after that. You may have more pain and stiffness for several days before it gets better. This is expected. It may take a few weeks or longer for it to heal completely. Good home treatment can help you get better faster and avoid future neck problems.  Follow-up care is a key part of your treatment and safety. Be sure to make and go to all appointments, and call your doctor if you are having problems. It's also a good idea to know your test results and keep a list of the medicines you take.  How can you care for yourself at home?  ?? If you were given a neck brace (cervical collar) to limit neck motion, wear it as instructed for as many days as your doctor tells you to. Do not wear it longer than you were told to. Wearing a brace for too long can make neck stiffness worse and weaken the neck muscles.  ?? You can try using heat or ice to see if it helps.  ? Try using a heating pad on a low or medium setting for 15 to 20 minutes every 2 to 3 hours. Try a warm shower in place of one session with the heating pad. You can also buy single-use heat wraps that last up to 8 hours.  ? You can also try an ice pack for 10 to 15 minutes every 2 to 3 hours.  ?? Take pain medicines exactly as directed.  ? If the doctor gave you a prescription medicine for pain, take it as prescribed.  ? If you are not taking a prescription pain medicine, ask your doctor if you can take an over-the-counter medicine.  ?? Gently rub the area to relieve pain and help with blood flow. Do not massage the area if it hurts to do so.  ?? Do not do anything that makes the pain worse. Take it easy for a couple of days. You can do your usual activities if they do not hurt your neck or put it at risk for more stress or injury.  ?? Try sleeping on a special neck pillow. Place it under your neck, not under your head. Placing a tightly rolled-up towel under your neck while you sleep will also work. If you use a neck pillow or rolled towel, do not use your regular pillow at the same time.  ?? To prevent future neck pain, do exercises to stretch and strengthen your neck and back. Learn how to use good posture, safe lifting techniques, and proper body mechanics.  When should you call for help?   Call  911 anytime you think you may need emergency care. For example, call if:  ?? ?? You are unable to move an arm or a leg at all.   Call your doctor now or seek immediate medical care if:  ?? ?? You have new or worse symptoms in your arms, legs, chest, belly, or buttocks. Symptoms may include:  ? Numbness or tingling.  ? Weakness.  ? Pain.   ?? ?? You lose bladder or bowel control.   Watch closely for changes in your health, and be sure to contact your doctor if:  ?? ?? You are not getting better as expected.   Where can you learn more?  Go to [facility] at [email or web address] in the Menu. Enter M253 in the search box to learn more about Neck Strain: Care Instructions.  Current as of: June 18, 2020??????????????????????????????Content Version: 13.2  ?? 2006-2022 [employer]wise, Inc[employer].   Care instructions adapted under license by [facility]. If you have questions about a medical condition or this instruction, always ask your healthcare professional. [employer], Inc[employer] disclaims any warranty or liability for your use of this information.

## 2021-04-12 NOTE — Unmapped (Signed)
Video Visit Note  This medical encounter was conducted virtually using Epic@  TeleHealth protocols.    I have identified myself to the patient and conveyed my credentials to Alexandra Villarreal  Patient/proxy has provided verbal consent to proceed with this video visit.  In case we get disconnected, [phone] (home)   In case of Emergency, patient's current location: Home: [address]  [place] [address]  Is there someone else in the room? No.     Alexandra Villarreal is a 55 y.o. female that presents for a video visit today regarding the following issues:    Assessment/Plan:           Problem List Items Addressed This Visit     None      Visit Diagnoses     Strain of neck muscle, initial encounter    -  Primary  Several days of neck muscle stiffness and bilateral hand numbness/pain. Unable to come in for in person visit today as she is in dialysis. No injury/trauma but had neck flexed during dialysis session on Friday prior to pain starting. Noted some improvement with flexeril, needs refill. Also provided handout of exercises. Reviewed strict precautions to have in person visit (hand weakness, progressive pain, inability to move neck).      Relevant Medications    cyclobenzaprine (FLEXERIL) 5 MG tablet          Followup:  No follow-ups on file.        The patient reports they are currently: at home. I spent 12 minutes on the real-time audio and video with the patient on the date of service. I spent an additional 0 minutes on pre- and post-visit activities on the date of service.     The patient was physically located in [place] or a state in which I am permitted to provide care. The patient and/or parent/guardian understood that s/he may incur co-pays and cost sharing, and agreed to the telemedicine visit. The visit was reasonable and appropriate under the circumstances given the patient's presentation at the time.    The patient and/or parent/guardian has been advised of the potential risks and limitations of this mode of treatment (including, but not limited to, the absence of in-person examination) and has agreed to be treated using telemedicine. The patient's/patient's family's questions regarding telemedicine have been answered.     If the visit was completed in an ambulatory setting, the patient and/or parent/guardian has also been advised to contact their provider???s office for worsening conditions, and seek emergency medical treatment and/or call 911 if the patient deems either necessary.             Subjective:     HPI  Alexandra Villarreal is a 55 y.o. female requesting a video visit to discuss the following issues:    Neck pain without injury, now both arms from elbow down are numb.    Getting dialysis in left arm (in [place] at [facility]).   Neck started to hurt on Friday/Saturday night and all through the weekend. Yesterday arms went numb from elbows down, lost strength in both arms.   Good side ways motion, flexion causes headache, has full extension.   No trauma/whiplash  Was looking down during dialysis all during the session on Friday  Feels tense.  No prior neck xrays   Used asper cream/lidocaine  Laying down helps   Took tylenol yesterday  Was prescribed flexeril months ago and this helped her sleep through the night  ROS  As per HPI.       HISTORY  I have reviewed the patient's problem list, current medications, and allergies and have updated/reconciled them as needed.    Alexandra Villarreal  reports that she has been smoking cigarettes. She has been smoking about 1.00 pack per day. She has never used smokeless tobacco.       Objective:     General: Well appearing , in no acute distress  Skin: Color is normal. No rashes.  Psych: Appropriate affect, normal mood  Resp: Normal work of breathing, no retractions  Ext: full neck ROM

## 2021-04-12 NOTE — Unmapped (Signed)
Answer Assessment - Initial Assessment Questions  04/12/21 1344, 1353, Called twice and no answer. Left message on pt identifiable voicemail to call back to [facility] if still needed.    Protocols used: NO CONTACT OR DUPLICATE CONTACT CALL-ADULT-[place]

## 2021-04-13 ENCOUNTER — Telehealth
Admit: 2021-04-13 | Discharge: 2021-04-14 | Payer: MEDICARE | Attending: Psychiatric/Mental Health | Primary: Psychiatric/Mental Health

## 2021-04-13 DIAGNOSIS — F331 Major depressive disorder, recurrent, moderate: Principal | ICD-10-CM

## 2021-04-13 MED ORDER — HYDROXYZINE HCL 50 MG TABLET
ORAL_TABLET | Freq: Two times a day (BID) | ORAL | 0 refills | 90 days | Status: CP | PRN
Start: 2021-04-13 — End: 2021-07-12

## 2021-04-13 MED ORDER — SERTRALINE 50 MG TABLET
ORAL_TABLET | Freq: Every day | ORAL | 0 refills | 90 days | Status: CP
Start: 2021-04-13 — End: 2021-07-12

## 2021-04-13 NOTE — Unmapped (Signed)
[facility]  Psychiatry   Established Patient E&M Service - Outpatient       Assessment:    Alexandra Villarreal presents for follow-up evaluation.     Identifying Information:  Alexandra Villarreal is a 55 y.o. female with a history of Bipolar disorder, OUD, cocaine use disorder.  Past mental health treatment from [facility]. Records requested.  At initial appt with this Clinical research associate, Alexandra Villarreal is endorsed low mood, wanting to socially isolate, low motivation. Episodes of depression often pass without requiring changes in medication and finds a lot of support in out patient therapy. Alexandra Villarreal has been admitted for Steal Syndrome (complication of dialysis) and start depression feels significantly worse since discharge. Primary care started bupropion for smoking cessation and hoping for additional mood benefits. Will monitor bupropion use for possible activation of mania or anxiety related to Bipolar disorder.     Risk Assessment:  A full suicide and violence risk assessment was performed as part of this patient's initial evaluation with [facility] outpatient psychiatry.  There is no new acute risk for suicide or violence at this time. The patient was educated about relevant modifiable risk factors including following recommendations for treatment of psychiatric illness and abstaining from substance abuse.   While future psychiatric events cannot be accurately predicted, the patient does not currently require acute inpatient psychiatric care and does not currently meet [facility] involuntary commitment criteria.      Plan:    Problem: Bipolar disorder  Status of problem:  new problem to this provider  Interventions:  - continue bupropion 300 mg daily- prescribed by primary care for smoking cessation   Discussed possible risk of side effects of bupropion including seizure, induction of mania and SI, anorexia, insomnia, dizziness, HA, agitation, hypertension, tremor, increased anxiety.     - continue lamotrigine 100 mg  Discussed possible side effects of lamictal including rash, Stevens-Johnson Syndrome, sedation, tremor, HA, GI effects, ataxia, withdrawal seizures, activation of SI, insomnia.     - continue quetiapine 200 mg qhs  Discussed possible risk of side effects for quetiapine including hyperglycemia, NMS, seizure, increased risk for diabetes and dyslipidemia, dizziness, sedation, tachycardia, orthostatic hypotension, tardive dyskinesia.     - continue sertraline 150 mg  Discussed possible risk of side effects of sertraline including seizure, induction of mania, activation of SI, sexual dysfunction, GI, sedation, HA dizziness, hyponatremia, serotonin syndrome.      - continue pregabalin 50 mg bid- prescribed by primary care    -  Continue clonazepam 0.25 mg (half tab) qhs with plan to discontinue.   Discussed possible risk of side effects of long-term use of benzodiazepines including sedation, fatigue, CNS depression, forgetfulness, depression, confusion, hepatic dysfunction, renal dysfunction, development to tolerance. Risk of fatality if overdosed. Long term use shows lack of efficacy, risk of dependence, increased risk of cognitive effects. Risk of seizure upon withdrawal.   ??   Requesting referral for therapy. Ok with individual therapy from [facility].   ??  Problem: Opiate use disorder/ cocaine use disorder  Status of problem:  new problem to this provider  Interventions: presently in remission.         Psychotherapy provided:  No billable psychotherapy service provided but brief supportive therapy was utilized.    Patient has been given this writer's contact information as well as the [facility] urgent line number. The patient has been instructed to call 911 for emergencies.          Subjective:    Interval  History:   I think the Wellbutrin is good for me. Endorsing feeling more energy, more like myself, sometimes talking loudly. I feel more like myself. Energy has improved during the day, able to accomplish more tasks around the house. Hopelessness has improved over past 2 weeks. Sleeping well at night. No longer wanting to nap during the day. Last tobacco cigarette was over 10 days ago. Happy about 2 grandchildren that will be joining the family this year.     Reports being sick with bronchitis, but is feeling better today. Reports pain in her hand has improved since surgery. Sleep has been disruptive, waking up every two hours. Endorses daytime naps. Reinforced sleep hygiene. Has reduced cigarettes to three cigarettes a day. Finds smoking cessation difficult, does not think the Wellbutrin is beneficial, interested in increasing dosage. Plan today is to increase bupropion 150 mg to bupropion 300 mg daily. Discussed risks of bupropion including seizures, induction of mania and SI.         Objective:    Mental Status Exam:  Appearance:    Appears stated age and Clean/Neat   Motor:   No abnormal movements   Speech/Language:    Normal rate, volume, tone, fluency   Mood:   Ok   Affect:   Calm and Decreased range   Thought process and Associations:   Logical, linear, clear, coherent, goal directed   Abnormal/psychotic thought content:     Denies SI, HI, self harm, delusions, obsessions, paranoid ideation, or ideas of reference   Perceptual disturbances:     Denies auditory and visual hallucinations, behavior not concerning for response to internal stimuli     Other:            Visit was completed by video (or phone) and the appropriate disclaimer has been included below.          Rosezetta Schlatter, PMHNP        The patient reports they are currently: at home. I spent 25 minutes on the real-time audio and video with the patient on the date of service. I spent an additional 7 minutes on pre- and post-visit activities on the date of service.     The patient was physically located in [place] or a state in which I am permitted to provide care. The patient and/or parent/guardian understood that s/he may incur co-pays and cost sharing, and agreed to the telemedicine visit. The visit was reasonable and appropriate under the circumstances given the patient's presentation at the time.    The patient and/or parent/guardian has been advised of the potential risks and limitations of this mode of treatment (including, but not limited to, the absence of in-person examination) and has agreed to be treated using telemedicine. The patient's/patient's family's questions regarding telemedicine have been answered.     If the visit was completed in an ambulatory setting, the patient and/or parent/guardian has also been advised to contact their provider???s office for worsening conditions, and seek emergency medical treatment and/or call 911 if the patient deems either necessary.

## 2021-04-15 MED ORDER — ASPIRIN 81 MG TABLET,DELAYED RELEASE
ORAL_TABLET | Freq: Every day | ORAL | 0 refills | 90 days
Start: 2021-04-15 — End: 2021-07-14

## 2021-04-15 NOTE — Unmapped (Signed)
Specialty Medication(s): Epclusa 400-100mg     Alexandra Villarreal has been dis-enrolled from the [facility] specialty pharmacy services due to therapy completion - expected therapy completion date: 05/23/21. Her last shipment of Epclusa will be delivered on 04/20/21.    Additional information provided to the patient: none    [name] [facility]  [facility] Specialty Pharmacist

## 2021-04-15 NOTE — Unmapped (Signed)
[facility] Clinical Assessment & Refill Coordination Note    Alexandra Villarreal, DOB: [birth year]  Phone: [phone] (home)     All above HIPAA information was verified with patient.     Was a Nurse, learning disability used for this call? No    Specialty Medication(s):   Infectious Disease: Epclusa     Current Outpatient Medications   Medication Sig Dispense Refill   ??? acetaminophen (TYLENOL) 500 MG tablet Take 2 tablets (1,000 mg total) by mouth every eight (8) hours as needed for pain. 180 tablet 0   ??? albuterol 2.5 mg /3 mL (0.083 %) nebulizer solution 3 mL.      ??? aspirin (ECOTRIN) 81 MG tablet Take 1 tablet (81 mg total) by mouth daily. 90 tablet 0   ??? atorvastatin (LIPITOR) 40 MG tablet Take 1 tablet (40 mg total) by mouth daily. For cholesterol 30 tablet 11   ??? bisacodyL (DULCOLAX, BISACODYL,) 10 mg suppository Continuous.     ??? bumetanide (BUMEX) 2 MG tablet Take 1 tablet (2 mg total) by mouth two (2) times a day. 60 tablet 0   ??? buPROPion (WELLBUTRIN XL) 150 MG 24 hr tablet Take 2 tablets (300 mg total) by mouth every morning. 120 tablet 0   ??? cyclobenzaprine (FLEXERIL) 5 MG tablet Take 1 tablet (5 mg total) by mouth every other day as needed for muscle spasms. 10 tablet 0   ??? diclofenac sodium (VOLTAREN) 1 % gel Apply 2 g topically four (4) times a day as needed for pain. 200 g 11   ??? ferric citrate (AURYXIA) 210 mg iron Tab tablet Take 2 tablets (420 mg total) by mouth Three (3) times a day. 180 tablet 11   ??? hydrOXYzine (ATARAX) 50 MG tablet Take 1 tablet (50 mg total) by mouth two (2) times a day as needed for anxiety. 180 tablet 0   ??? insulin ASPART (NOVOLOG FLEXPEN U-100 INSULIN) 100 unit/mL (3 mL) injection pen Novolog Flexpen U-100 Insulin aspart 100 unit/mL (3 mL) subcutaneous   INJECT SUBCUTANEOUSLY TID PER SLIDING SCALE     ??? insulin glargine (LANTUS) 100 unit/mL injection Inject 0.14 mL (14 Units total) under the skin nightly. 15 mL 2   ??? lamoTRIgine (LAMICTAL) 100 MG tablet Take 1 tablet (100 mg total) by mouth nightly. 90 tablet 0   ??? levothyroxine (SYNTHROID) 25 MCG tablet Take 1.5 tablets (37.5 mcg total) by mouth daily. 30 tablet 2   ??? losartan (COZAAR) 25 MG tablet Take 25 mg by mouth daily.     ??? melatonin 3 mg Tab Take 1 tablet (3 mg total) by mouth every evening. 60 tablet 11   ??? metoclopramide (REGLAN) 10 MG tablet Take 10 mg by mouth daily as needed for GI discomfort.     ??? metoprolol tartrate (LOPRESSOR) 100 MG tablet metoprolol tartrate 100 mg tablet   TAKE 1/2 TABLET BY MOUTH DAILY     ??? NIFEdipine (PROCARDIA XL) 90 MG 24 hr tablet Take 1 tablet (90 mg total) by mouth daily. 30 tablet 5   ??? ondansetron (ZOFRAN) 4 MG tablet Take 4 mg by mouth every eight (8) hours as needed.     ??? pen needle, diabetic (PEN NEEDLE) 32 gauge x 5/32 (4 mm) Ndle 120 each by Miscellaneous route Three (3) times a day with a meal. 120 each 3   ??? pregabalin (LYRICA) 100 MG capsule Take 1 capsule (100 mg total) by mouth nightly. 30 capsule 2   ??? promethazine (  PHENERGAN) 25 MG tablet promethazine 25 mg tablet   TAKE ONE TABLET BY MOUTH EVERY 6 HOURS AS NEEDED     ??? QUEtiapine (SEROQUEL) 300 MG tablet Take 1 tablet (300 mg total) by mouth nightly. 90 tablet 4   ??? sertraline (ZOLOFT) 50 MG tablet Take 3 tablets (150 mg total) by mouth daily. 270 tablet 0   ??? sevelamer (RENVELA) 800 mg tablet Take 1 tablet (800 mg total) by mouth Three (3) times a day with a meal. 90 tablet 11   ??? sofosbuvir-velpatasvir (EPCLUSA) 400-100 mg tablet Take 1 tablet by mouth daily. 28 tablet 2   ??? sofosbuvir-velpatasvir (EPCLUSA) 400-100 mg tablet Take 1 tablet by mouth daily. 28 tablet 2     No current facility-administered medications for this visit.        Changes to medications: [name] reports starting the following medications: Wellbutrin XL 300mg  daily and cyclobenzaprine    Allergies   Allergen Reactions   ??? Nsaids (Non-Steroidal Anti-Inflammatory Drug) Other (See Comments)   ??? Penicillin G    ??? Morphine Itching       Changes to allergies: No     SPECIALTY MEDICATION ADHERENCE     Epclusa 400-100 mg: 10 days of medicine on hand     Medication Adherence    Patient reported X missed doses in the last month: 0  Specialty Medication: Epclusa 400-100mg   Patient is on additional specialty medications: No  Demonstrates understanding of importance of adherence: yes  Informant: patient  Provider-estimated medication adherence level: good  Patient is at risk for Non-Adherence: No          Specialty medication(s) dose(s) confirmed: Regimen is correct and unchanged.     Are there any concerns with adherence? No    Adherence counseling provided? Not needed    CLINICAL MANAGEMENT AND INTERVENTION      Clinical Benefit Assessment:    Do you feel the medicine is effective or helping your condition? Yes    Clinical Benefit counseling provided? Not needed    Adverse Effects Assessment:    Are you experiencing any side effects? No She had fatigue and a headache in the beginning but these problems have gone away now    Are you experiencing difficulty administering your medicine? No    Quality of Life Assessment:    How many days over the past month did your Hepatitis C  keep you from your normal activities? For example, brushing your teeth or getting up in the morning. 0    Have you discussed this with your provider? Not needed    Acute Infection Status:    Acute infections noted within Epic:  No active infections  Patient reported infection: None    Therapy Appropriateness:    Is therapy appropriate? Yes, therapy is appropriate and should be continued    DISEASE/MEDICATION-SPECIFIC INFORMATION      For Hepatitis C patients (clinical assessment):  Regimen: Epclusa x 12 weeks  Therapy start date: 03/01/21  Completed Treatment Week #: 6  What time of day do you take your medicine? Evening  Are you taking any new OTC or herbal medication? No  Any alcoholic beverages? No    PATIENT SPECIFIC NEEDS     - Does the patient have any physical, cognitive, or cultural barriers? No    - Is the patient high risk? No    - Does the patient require a Care Management Plan? No     - Does the patient require physician  intervention or other additional services (i.e. nutrition, smoking cessation, social work)? No      SHIPPING     Specialty Medication(s) to be Shipped:   Infectious Disease: Epclusa    Other medication(s) to be shipped: atorvastatin and aspirin: new Rx needed: request sent     Changes to insurance: No    Delivery Scheduled: Yes, Expected medication delivery date: 04/20/21.     Medication will be delivered via Next Day Courier to the confirmed prescription address in [facility]    The patient will receive a drug information handout for each medication shipped and additional FDA Medication Guides as required.  Verified that patient has previously received a Conservation officer, historic buildings and a Surveyor, mining.    All of the patient's questions and concerns have been addressed.    Roderic Palau   [facility] Specialty Pharmacist

## 2021-04-16 ENCOUNTER — Telehealth
Admit: 2021-04-16 | Discharge: 2021-04-17 | Payer: MEDICARE | Attending: Public Health & General Preventive Medicine | Primary: Public Health & General Preventive Medicine

## 2021-04-17 MED ORDER — ASPIRIN 81 MG TABLET,DELAYED RELEASE
ORAL_TABLET | Freq: Every day | ORAL | 0 refills | 90 days | Status: CP
Start: 2021-04-17 — End: 2021-07-16
  Filled 2021-04-19: qty 90, 90d supply, fill #0

## 2021-04-19 MED FILL — ATORVASTATIN 40 MG TABLET: ORAL | 30 days supply | Qty: 30 | Fill #2

## 2021-04-19 MED FILL — EPCLUSA 400 MG-100 MG TABLET: ORAL | 28 days supply | Qty: 28 | Fill #2

## 2021-04-20 ENCOUNTER — Telehealth: Admit: 2021-04-20 | Discharge: 2021-04-21 | Payer: MEDICARE | Attending: Clinical | Primary: Clinical

## 2021-04-20 DIAGNOSIS — F331 Major depressive disorder, recurrent, moderate: Principal | ICD-10-CM

## 2021-04-20 MED ORDER — SERTRALINE 50 MG TABLET
ORAL_TABLET | Freq: Every day | ORAL | 0 refills | 90 days | Status: CP
Start: 2021-04-20 — End: 2021-07-19

## 2021-04-21 DIAGNOSIS — F17200 Nicotine dependence, unspecified, uncomplicated: Principal | ICD-10-CM

## 2021-04-21 DIAGNOSIS — N186 End stage renal disease: Principal | ICD-10-CM

## 2021-04-21 DIAGNOSIS — G629 Polyneuropathy, unspecified: Principal | ICD-10-CM

## 2021-04-21 DIAGNOSIS — E039 Hypothyroidism, unspecified: Principal | ICD-10-CM

## 2021-04-21 DIAGNOSIS — F331 Major depressive disorder, recurrent, moderate: Principal | ICD-10-CM

## 2021-04-21 MED ORDER — PREGABALIN 100 MG CAPSULE
ORAL_CAPSULE | Freq: Every evening | ORAL | 2 refills | 30 days | Status: CP
Start: 2021-04-21 — End: 2022-04-21

## 2021-04-21 MED ORDER — LEVOTHYROXINE 25 MCG TABLET
ORAL_TABLET | 2 refills | 0 days | Status: CP
Start: 2021-04-21 — End: ?

## 2021-04-21 MED ORDER — BUPROPION HCL XL 150 MG 24 HR TABLET, EXTENDED RELEASE
ORAL_TABLET | Freq: Every morning | ORAL | 0 refills | 60 days | Status: CP
Start: 2021-04-21 — End: 2021-06-20

## 2021-04-21 MED ORDER — BUMETANIDE 2 MG TABLET
ORAL_TABLET | Freq: Two times a day (BID) | ORAL | 0 refills | 30.00000 days | Status: CP
Start: 2021-04-21 — End: ?

## 2021-04-22 DIAGNOSIS — N186 End stage renal disease: Principal | ICD-10-CM

## 2021-04-23 DIAGNOSIS — N186 End stage renal disease: Principal | ICD-10-CM

## 2021-04-23 DIAGNOSIS — S161XXA Strain of muscle, fascia and tendon at neck level, initial encounter: Principal | ICD-10-CM

## 2021-04-23 DIAGNOSIS — E1122 Type 2 diabetes mellitus with diabetic chronic kidney disease: Principal | ICD-10-CM

## 2021-04-23 DIAGNOSIS — Z992 Dependence on renal dialysis: Principal | ICD-10-CM

## 2021-04-23 DIAGNOSIS — I10 Essential (primary) hypertension: Principal | ICD-10-CM

## 2021-04-23 DIAGNOSIS — Z794 Long term (current) use of insulin: Principal | ICD-10-CM

## 2021-04-23 MED ORDER — CYCLOBENZAPRINE 5 MG TABLET
ORAL_TABLET | ORAL | 0 refills | 20 days | PRN
Start: 2021-04-23 — End: ?

## 2021-04-27 ENCOUNTER — Telehealth: Admit: 2021-04-27 | Discharge: 2021-04-28 | Payer: MEDICARE | Attending: Clinical | Primary: Clinical

## 2021-04-27 DIAGNOSIS — F331 Major depressive disorder, recurrent, moderate: Principal | ICD-10-CM

## 2021-04-30 MED ORDER — LAMOTRIGINE 100 MG TABLET
ORAL_TABLET | 0 refills | 0 days
Start: 2021-04-30 — End: ?

## 2021-05-02 DIAGNOSIS — S161XXA Strain of muscle, fascia and tendon at neck level, initial encounter: Principal | ICD-10-CM

## 2021-05-04 DIAGNOSIS — F17200 Nicotine dependence, unspecified, uncomplicated: Principal | ICD-10-CM

## 2021-05-04 DIAGNOSIS — F331 Major depressive disorder, recurrent, moderate: Principal | ICD-10-CM

## 2021-05-06 ENCOUNTER — Telehealth: Admit: 2021-05-06 | Discharge: 2021-05-06 | Payer: MEDICARE | Attending: Clinical | Primary: Clinical

## 2021-05-06 DIAGNOSIS — F331 Major depressive disorder, recurrent, moderate: Principal | ICD-10-CM

## 2021-05-06 DIAGNOSIS — S161XXA Strain of muscle, fascia and tendon at neck level, initial encounter: Principal | ICD-10-CM

## 2021-05-06 MED ORDER — CYCLOBENZAPRINE 5 MG TABLET
ORAL_TABLET | ORAL | 0 refills | 20 days | Status: CP | PRN
Start: 2021-05-06 — End: ?

## 2021-05-07 MED ORDER — CYCLOBENZAPRINE 5 MG TABLET
ORAL_TABLET | 0 refills | 0 days
Start: 2021-05-07 — End: ?

## 2021-05-07 MED ORDER — BUPROPION HCL XL 150 MG 24 HR TABLET, EXTENDED RELEASE
ORAL_TABLET | 0 refills | 0 days | Status: CP
Start: 2021-05-07 — End: ?

## 2021-05-07 MED FILL — MELATONIN 3 MG TABLET: ORAL | 100 days supply | Qty: 100 | Fill #1

## 2021-05-11 ENCOUNTER — Telehealth: Admit: 2021-05-11 | Discharge: 2021-05-12 | Payer: MEDICARE | Attending: Clinical | Primary: Clinical

## 2021-05-11 DIAGNOSIS — Z8639 Personal history of other endocrine, nutritional and metabolic disease: Principal | ICD-10-CM

## 2021-05-11 DIAGNOSIS — F509 Eating disorder, unspecified: Principal | ICD-10-CM

## 2021-05-11 DIAGNOSIS — Z9884 Bariatric surgery status: Principal | ICD-10-CM

## 2021-05-13 ENCOUNTER — Telehealth: Admit: 2021-05-13 | Discharge: 2021-05-13 | Payer: MEDICARE | Attending: Clinical | Primary: Clinical

## 2021-05-13 DIAGNOSIS — F331 Major depressive disorder, recurrent, moderate: Principal | ICD-10-CM

## 2021-05-16 DIAGNOSIS — E039 Hypothyroidism, unspecified: Principal | ICD-10-CM

## 2021-05-16 MED ORDER — LEVOTHYROXINE 25 MCG TABLET
ORAL_TABLET | 0 refills | 0 days
Start: 2021-05-16 — End: ?

## 2021-05-18 ENCOUNTER — Ambulatory Visit: Admit: 2021-05-18 | Discharge: 2021-05-19 | Payer: MEDICARE

## 2021-05-18 DIAGNOSIS — F509 Eating disorder, unspecified: Principal | ICD-10-CM

## 2021-05-18 MED ORDER — LEVOTHYROXINE 25 MCG TABLET
ORAL_TABLET | 1 refills | 0 days | Status: CP
Start: 2021-05-18 — End: ?

## 2021-05-25 ENCOUNTER — Telehealth: Admit: 2021-05-25 | Discharge: 2021-05-26 | Payer: MEDICARE | Attending: Clinical | Primary: Clinical

## 2021-05-25 ENCOUNTER — Ambulatory Visit
Admit: 2021-05-25 | Discharge: 2021-05-26 | Payer: MEDICARE | Attending: Psychiatric/Mental Health | Primary: Psychiatric/Mental Health

## 2021-05-25 ENCOUNTER — Other Ambulatory Visit: Payer: Self-pay

## 2021-05-25 ENCOUNTER — Ambulatory Visit
Admission: EM | Admit: 2021-05-25 | Discharge: 2021-05-25 | Disposition: A | Discharge status: Home / Self Care | Payer: Medicare Other | Attending: Family Medicine | Admitting: Family Medicine

## 2021-05-25 DIAGNOSIS — E1165 Type 2 diabetes mellitus with hyperglycemia: Secondary | ICD-10-CM

## 2021-05-25 DIAGNOSIS — T1490XA Injury, unspecified, initial encounter: Principal | ICD-10-CM

## 2021-05-25 DIAGNOSIS — F509 Eating disorder, unspecified: Principal | ICD-10-CM

## 2021-05-25 DIAGNOSIS — F3175 Bipolar disorder, in partial remission, most recent episode depressed: Principal | ICD-10-CM

## 2021-05-25 LAB — GLUCOSE, CAPILLARY: Glucose-Capillary: 222 mg/dL — ABNORMAL HIGH (ref 70–99)

## 2021-05-25 MED ORDER — INSULIN LISPRO (1 UNIT DIAL) 100 UNIT/ML (KWIKPEN): 10.0000 [IU] | PEN_INJECTOR | Freq: Three times a day (TID) | SUBCUTANEOUS | 11 refills | Status: AC

## 2021-05-25 NOTE — ED Provider Notes (Signed)
[facility]    CSN: [id number] Arrival date & time: 05/25/21  8127      History   Chief Complaint Chief Complaint  Patient presents with  . Hyperglycemia   HPI  55 year old female presents with the above complaint.  Patient reports that her blood sugars have been uncontrolled.  She states that she has been out of her rapid insulin (Humalog) for greater than a month.  She called her primary care physician and she has been unable to get it refilled.  She states that her fasting blood sugar yesterday morning was 118.  Fasting blood sugars seem to be well controlled on Lantus.  She states that she takes 24 units of Lantus at night.  Patient reports that she normally takes 10 units of Humalog with her meals.  She states that her postprandials have been quite high due to lack of insulin.  Patient is in need of insulin refill today.  No other complaints.  Past Medical History:  Diagnosis Date  . Diabetes mellitus without complication (HCC)   . Heart disease   . Hypertension   . Kidney disease   . Thyroid disease    Past Surgical History:  Procedure Laterality Date  . ABDOMINAL HYSTERECTOMY    . AV FISTULA PLACEMENT    . BACK SURGERY    . COSMETIC SURGERY    . GALLBLADDER SURGERY    . GASTRIC BYPASS    . HERNIA REPAIR      OB History   No obstetric history on file.      Home Medications    Prior to Admission medications   Medication Sig Start Date End Date Taking? Authorizing Provider  Aspirin Buf,CaCarb-MgCarb-MgO, 81 MG TABS Take 1 tablet by mouth daily. 05/09/18  Yes [provider]  atorvastatin (LIPITOR) 40 MG tablet Take 1 tablet by mouth daily. 04/19/21  Yes [provider]  bumetanide (BUMEX) 1 MG tablet 2 tablet 2 TIMES DAILY (route: oral) 08/25/20  Yes [provider]  carvedilol (COREG) 25 MG tablet Take 1 tablet by mouth 2 (two) times daily. 07/04/18  Yes [provider]  EPCLUSA 400-100 MG TABS Take 1 tablet by mouth  daily. 04/19/21  Yes [provider]  fluticasone (FLONASE) 50 MCG/ACT nasal spray 1 spray DAILY (route: nasal) 01/22/21  Yes [provider]  hydrOXYzine (ATARAX/VISTARIL) 50 MG tablet Take 50 mg by mouth 2 (two) times daily. 04/13/21  Yes [provider]  insulin glargine (LANTUS SOLOSTAR) 100 UNIT/ML Solostar Pen 2 unit BEDTIME (route: subcutaneous) 01/15/21  Yes [provider]  insulin lispro (HUMALOG) 100 UNIT/ML KwikPen Inject 10 Units into the skin 3 (three) times daily. 05/25/21  Yes Tommie Sams, DO  levothyroxine (SYNTHROID) 75 MCG tablet 1 tablet DAILY (route: oral) 01/15/21  Yes [provider]  losartan (COZAAR) 25 MG tablet Take 25 mg by mouth daily. 04/21/21  Yes [provider]  metoCLOPramide (REGLAN) 5 MG tablet Take by mouth. 09/19/18  Yes [provider]  metoprolol tartrate (LOPRESSOR) 100 MG tablet 1 tablet 2 TIMES DAILY (route: oral) 11/03/20  Yes [provider]  NIFEdipine (PROCARDIA XL/NIFEDICAL-XL) 90 MG 24 hr tablet Take 90 mg by mouth daily. 04/23/21  Yes [provider]  pregabalin (LYRICA) 75 MG capsule Take 75 mg by mouth daily. 01/05/21  Yes [provider]  QUEtiapine (SEROQUEL XR) 50 MG TB24 24 hr tablet Take by mouth. 09/19/18  Yes [provider]  sertraline (ZOLOFT) 50 MG tablet  Take 3 tablets by mouth daily. 04/13/21  Yes [provider]  sevelamer carbonate (RENVELA) 800 MG tablet Take by mouth. 05/03/21  Yes [provider]  lamoTRIgine (LAMICTAL) 100 MG tablet Take 100 mg by mouth daily. 05/04/21   [provider]  ALBUTEROL IN 2 puff 2 TIMES DAILY (route: inhalation) 01/22/21 05/25/21  [provider]  CLONAZEPAM PO 0.5 tablet AS DIRECTED (route: oral) 12/11/20 05/25/21  [provider]    Family History Family History  Problem Relation Age of Onset  . Hypertension Mother   . Diabetes Mother   . Cancer Mother   . Hypertension  Father   . Diabetes Father   . Heart failure Father     Social History Social History   Tobacco Use  . Smoking status: Former Smoker    Quit date: 03/07/2021    Years since quitting: 0.2  . Smokeless tobacco: Never Used  Vaping Use  . Vaping Use: Never used  Substance Use Topics  . Alcohol use: Never  . Drug use: Never     Allergies   Penicillins, Morphine, and Nsaids   Review of Systems Review of Systems  Constitutional: Negative.   Endocrine:       Hyperglycemia.   Physical Exam Triage Vital Signs ED Triage Vitals  Enc Vitals Group     BP 05/25/21 0840 138/77     Pulse Rate 05/25/21 0840 (!) 57     Resp 05/25/21 0840 16     Temp 05/25/21 0840 97.9 F (36.6 C)     Temp Source 05/25/21 0840 Oral     SpO2 05/25/21 0840 98 %     Weight 05/25/21 0839 163 lb 2.3 oz (74 kg)     Height --      Head Circumference --      Peak Flow --      Pain Score 05/25/21 0839 0     Pain Loc --      Pain Edu? --      Excl. in GC? --    Updated Vital Signs BP 138/77 (BP Location: Right Arm)   Pulse (!) 57   Temp 97.9 F (36.6 C) (Oral)   Resp 16   Wt 74 kg   SpO2 98%   Visual Acuity Right Eye Distance:   Left Eye Distance:   Bilateral Distance:    Right Eye Near:   Left Eye Near:    Bilateral Near:     Physical Exam Vitals and nursing note reviewed.  Constitutional:      General: She is not in acute distress.    Appearance: Normal appearance. She is not ill-appearing.  HENT:     Head: Normocephalic and atraumatic.  Eyes:     General:        Right eye: No discharge.        Left eye: No discharge.     Conjunctiva/sclera: Conjunctivae normal.  Cardiovascular:     Rate and Rhythm: Normal rate and regular rhythm.     Heart sounds: Murmur heard.    Pulmonary:     Effort: Pulmonary effort is normal.     Breath sounds: Normal breath sounds. No wheezing or rales.  Neurological:     Mental Status: She is alert.  Psychiatric:        Mood and Affect: Mood  normal.        Behavior: Behavior normal.     UC Treatments / Results  Labs (all labs ordered are  listed, but only abnormal results are displayed) Labs Reviewed  GLUCOSE, CAPILLARY - Abnormal; Notable for the following components:      Result Value   Glucose-Capillary 222 (*)    All other components within normal limits  CBG MONITORING, ED    EKG   Radiology No results found.  Procedures Procedures (including critical care time)  Medications Ordered in UC Medications - No data to display  Initial Impression / Assessment and Plan / UC Course  I have reviewed the triage vital signs and the nursing notes.  Pertinent labs & imaging results that were available during my care of the patient were reviewed by me and considered in my medical decision making (see chart for details).    55 year old female presents with uncontrolled type 2 diabetes.  Advised her to continue her Lantus as prescribed.  She is to take Humalog 10 units 3 times daily with meals.  I have refilled her medication and given her multiple refills.  Information given regarding local PCPs.  Final Clinical Impressions(s) / UC Diagnoses   Final diagnoses:  Uncontrolled type 2 diabetes mellitus with hyperglycemia [facility])     Discharge Instructions     PCP recommendations: [facility], [facility]  I have refilled your Humalog.  Take care  Dr. Adriana Simas    ED Prescriptions    Medication Sig Dispense Auth. Provider   insulin lispro (HUMALOG) 100 UNIT/ML KwikPen Inject 10 Units into the skin 3 (three) times daily. 15 mL Tommie Sams, DO     PDMP not reviewed this encounter.   Everlene Other [place], [place] 05/25/21 [phone]

## 2021-05-25 NOTE — ED Triage Notes (Signed)
Pt c/o having diabetes issue, states her blood sugar has been running in the 600s occassionally. States she needs her insulin refilled and she can't get in with her primary.

## 2021-05-25 NOTE — Discharge Instructions (Signed)
PCP recommendations: [facility], [facility]  I have refilled your Humalog.  Take care  Dr. Adriana Simas

## 2021-05-26 DIAGNOSIS — S161XXA Strain of muscle, fascia and tendon at neck level, initial encounter: Principal | ICD-10-CM

## 2021-05-26 MED ORDER — CYCLOBENZAPRINE 5 MG TABLET
ORAL_TABLET | 0 refills | 0 days
Start: 2021-05-26 — End: ?

## 2021-05-27 ENCOUNTER — Ambulatory Visit: Admit: 2021-05-27 | Payer: MEDICARE | Attending: Clinical | Primary: Clinical

## 2021-05-27 DIAGNOSIS — Z20822 Encounter for preprocedure screening laboratory testing for COVID-19: Principal | ICD-10-CM

## 2021-05-27 DIAGNOSIS — Z01812 Encounter for preprocedural laboratory examination: Principal | ICD-10-CM

## 2021-05-28 ENCOUNTER — Ambulatory Visit: Admit: 2021-05-28 | Discharge: 2021-05-29 | Payer: MEDICARE

## 2021-05-31 ENCOUNTER — Ambulatory Visit: Admit: 2021-05-31 | Discharge: 2021-06-01 | Payer: MEDICARE

## 2021-06-02 ENCOUNTER — Telehealth
Admit: 2021-06-02 | Discharge: 2021-06-03 | Payer: MEDICARE | Attending: Student in an Organized Health Care Education/Training Program | Primary: Student in an Organized Health Care Education/Training Program

## 2021-06-02 DIAGNOSIS — E1122 Type 2 diabetes mellitus with diabetic chronic kidney disease: Principal | ICD-10-CM

## 2021-06-02 DIAGNOSIS — N186 End stage renal disease: Principal | ICD-10-CM

## 2021-06-02 DIAGNOSIS — Z794 Long term (current) use of insulin: Principal | ICD-10-CM

## 2021-06-02 DIAGNOSIS — H538 Other visual disturbances: Principal | ICD-10-CM

## 2021-06-02 DIAGNOSIS — Z992 Dependence on renal dialysis: Principal | ICD-10-CM

## 2021-06-02 MED ORDER — INSULIN ASPART U-100  100 UNIT/ML SUBCUTANEOUS SOLUTION
Freq: Three times a day (TID) | SUBCUTANEOUS | 0 refills | 360 days | Status: CP
Start: 2021-06-02 — End: 2022-06-02

## 2021-06-02 MED ORDER — INSULIN ASPART (U-100) 100 UNIT/ML (3 ML) SUBCUTANEOUS PEN
Freq: Three times a day (TID) | SUBCUTANEOUS | 3 refills | 90.00000 days | Status: CN
Start: 2021-06-02 — End: 2022-06-02

## 2021-06-03 ENCOUNTER — Ambulatory Visit: Admit: 2021-06-03 | Payer: MEDICARE | Attending: Clinical | Primary: Clinical

## 2021-06-10 DIAGNOSIS — F17200 Nicotine dependence, unspecified, uncomplicated: Principal | ICD-10-CM

## 2021-06-10 DIAGNOSIS — F331 Major depressive disorder, recurrent, moderate: Principal | ICD-10-CM

## 2021-06-10 MED ORDER — BUPROPION HCL XL 150 MG 24 HR TABLET, EXTENDED RELEASE
ORAL_TABLET | 2 refills | 0 days | Status: CP
Start: 2021-06-10 — End: ?

## 2021-06-14 MED ORDER — CYCLOBENZAPRINE 5 MG TABLET
ORAL_TABLET | 0 refills | 0 days
Start: 2021-06-14 — End: ?

## 2021-06-15 ENCOUNTER — Telehealth
Admit: 2021-06-15 | Discharge: 2021-06-16 | Payer: MEDICARE | Attending: Psychiatric/Mental Health | Primary: Psychiatric/Mental Health

## 2021-06-15 DIAGNOSIS — F509 Eating disorder, unspecified: Principal | ICD-10-CM

## 2021-06-15 DIAGNOSIS — F3175 Bipolar disorder, in partial remission, most recent episode depressed: Principal | ICD-10-CM

## 2021-06-17 ENCOUNTER — Ambulatory Visit: Admit: 2021-06-17 | Payer: MEDICARE | Attending: Clinical | Primary: Clinical

## 2021-06-17 ENCOUNTER — Ambulatory Visit: Admit: 2021-06-17 | Discharge: 2021-06-18 | Payer: MEDICARE

## 2021-06-22 ENCOUNTER — Telehealth: Admit: 2021-06-22 | Discharge: 2021-06-22 | Payer: MEDICARE | Attending: Clinical | Primary: Clinical

## 2021-06-24 ENCOUNTER — Ambulatory Visit: Admit: 2021-06-24 | Payer: MEDICARE | Attending: Clinical | Primary: Clinical

## 2021-07-01 ENCOUNTER — Ambulatory Visit: Admit: 2021-07-01 | Payer: MEDICARE | Attending: Clinical | Primary: Clinical

## 2021-07-08 ENCOUNTER — Ambulatory Visit
Admit: 2021-07-08 | Discharge: 2021-07-09 | Payer: MEDICARE | Attending: Student in an Organized Health Care Education/Training Program | Primary: Student in an Organized Health Care Education/Training Program

## 2021-07-08 ENCOUNTER — Telehealth: Admit: 2021-07-08 | Discharge: 2021-07-09 | Payer: MEDICARE | Attending: Clinical | Primary: Clinical

## 2021-07-08 DIAGNOSIS — F331 Major depressive disorder, recurrent, moderate: Principal | ICD-10-CM

## 2021-07-08 DIAGNOSIS — F5081 Binge eating disorder: Principal | ICD-10-CM

## 2021-07-08 MED ORDER — LIDOCAINE 5 % TOPICAL PATCH
MEDICATED_PATCH | Freq: Two times a day (BID) | TRANSDERMAL | 11 refills | 30.00000 days | Status: CP
Start: 2021-07-08 — End: 2022-07-08

## 2021-07-08 MED ORDER — CYCLOBENZAPRINE 5 MG TABLET
ORAL_TABLET | Freq: Three times a day (TID) | ORAL | 0 refills | 14 days | Status: CP
Start: 2021-07-08 — End: 2021-07-22

## 2021-07-08 MED ORDER — AMLODIPINE 10 MG TABLET
ORAL_TABLET | Freq: Every day | ORAL | 11 refills | 30 days | Status: CN
Start: 2021-07-08 — End: 2022-07-08

## 2021-07-12 ENCOUNTER — Other Ambulatory Visit: Payer: Self-pay

## 2021-07-12 ENCOUNTER — Encounter: Payer: Self-pay | Admitting: Emergency Medicine

## 2021-07-12 ENCOUNTER — Ambulatory Visit
Admission: EM | Admit: 2021-07-12 | Discharge: 2021-07-12 | Disposition: A | Discharge status: Home / Self Care | Payer: Medicare Other | Attending: Family Medicine | Admitting: Family Medicine

## 2021-07-12 DIAGNOSIS — N390 Urinary tract infection, site not specified: Secondary | ICD-10-CM | POA: Insufficient documentation

## 2021-07-12 LAB — POCT URINALYSIS DIP (DEVICE)
Glucose, UA: 100 mg/dL — AB
Ketones, ur: 15 mg/dL — AB
Nitrite: NEGATIVE
Protein, ur: >=300 mg/dL — AB
Specific Gravity, Urine: 1.025 (ref 1.005–1.030)
Urobilinogen, UA: 0.2 mg/dL (ref 0.0–1.0)
pH: 6 (ref 5.0–8.0)

## 2021-07-12 MED ORDER — CEFDINIR 300 MG PO CAPS: 300.0000 mg | ORAL_CAPSULE | ORAL | 0 refills | Status: DC

## 2021-07-12 NOTE — Discharge Instructions (Addendum)
Take the cefdinir following each dialysis treatment.  That would be tonight, Wednesday after dialysis, Friday after dialysis, and take your final dose on Sunday.  If you develop any fever, rigors or chills, vomiting, or increase in malaise please go to the ER for evaluation.

## 2021-07-12 NOTE — ED Triage Notes (Signed)
Pt presents today with c/o of painful urination with frequency/hesitation x 3 days. Denies fever. +lower abd pain

## 2021-07-12 NOTE — ED Provider Notes (Signed)
[facility]    CSN: [id number] Arrival date & time: 07/12/21  1624      History   Chief Complaint Chief Complaint  Patient presents with   Dysuria    HPI Nicole ReilMary Sheppard is a 55 y.o. female.   HPI  55 year old female here for evaluation of urinary complaints.  Patient reports that for the last 3 days she has been having painful urination, frequency with hesitation, and low back pain.  Patient is a hemodialysis patient who still makes urine 3 times a day.  She reports that typically her urine is normal in color but started become cloudy yesterday.  She denies any fever, seeing blood in urine, or chills.  She has had some nausea and states she does not feel well.  She had dialysis today but did not mention a thing to the dialysis clinic.  Past Medical History:  Diagnosis Date   Diabetes mellitus without complication (HCC)    Heart disease    Hypertension    Kidney disease    Thyroid disease     There are no problems to display for this patient.   Past Surgical History:  Procedure Laterality Date   ABDOMINAL HYSTERECTOMY     AV FISTULA PLACEMENT     BACK SURGERY     COSMETIC SURGERY     GALLBLADDER SURGERY     GASTRIC BYPASS     HERNIA REPAIR      OB History   No obstetric history on file.      Home Medications    Prior to Admission medications   Medication Sig Start Date End Date Taking? Authorizing Provider  cefdinir (OMNICEF) 300 MG capsule Take 1 capsule (300 mg total) by mouth 3 (three) times a week. Take the cefdinir Monday, Wednesday, Friday after each dialysis treatment and your final dose on Sunday. 07/12/21  Yes Becky Augustayan, Shanti Agresti, NP  Aspirin Buf,CaCarb-MgCarb-MgO, 81 MG TABS Take 1 tablet by mouth daily. 05/09/18   [provider]  atorvastatin (LIPITOR) 40 MG tablet Take 1 tablet by mouth daily. 04/19/21   [provider]  bumetanide (BUMEX) 1 MG tablet 2 tablet 2 TIMES DAILY (route: oral) 08/25/20   [provider]   carvedilol (COREG) 25 MG tablet Take 1 tablet by mouth 2 (two) times daily. 07/04/18   [provider]  EPCLUSA 400-100 MG TABS Take 1 tablet by mouth daily. 04/19/21   [provider]  fluticasone Aleda Grana(FLONASE) 50 MCG/ACT nasal spray 1 spray DAILY (route: nasal) 01/22/21   [provider]  hydrOXYzine (ATARAX/VISTARIL) 50 MG tablet Take 50 mg by mouth 2 (two) times daily. 04/13/21   [provider]  insulin glargine (LANTUS SOLOSTAR) 100 UNIT/ML Solostar Pen 2 unit BEDTIME (route: subcutaneous) 01/15/21   [provider]  insulin lispro (HUMALOG) 100 UNIT/ML KwikPen Inject 10 Units into the skin 3 (three) times daily. 05/25/21   Tommie Samsook, Jayce G, DO  lamoTRIgine (LAMICTAL) 100 MG tablet Take 100 mg by mouth daily. 05/04/21   [provider]  levothyroxine (SYNTHROID) 75 MCG tablet 1 tablet DAILY (route: oral) 01/15/21   [provider]  losartan (COZAAR) 25 MG tablet Take 25 mg by mouth daily. 04/21/21   [provider]  metoCLOPramide (REGLAN) 5 MG tablet Take by mouth. 09/19/18   [provider]  metoprolol tartrate (LOPRESSOR) 100 MG tablet 1 tablet 2 TIMES DAILY (route: oral) 11/03/20   [provider]  NIFEdipine (PROCARDIA XL/NIFEDICAL-XL) 90 MG 24 hr tablet Take 90  mg by mouth daily. 04/23/21   [provider]  pregabalin (LYRICA) 75 MG capsule Take 75 mg by mouth daily. 01/05/21   [provider]  QUEtiapine (SEROQUEL XR) 50 MG TB24 24 hr tablet Take by mouth. 09/19/18   [provider]  sertraline (ZOLOFT) 50 MG tablet Take 3 tablets by mouth daily. 04/13/21   [provider]  sevelamer carbonate (RENVELA) 800 MG tablet Take by mouth. 05/03/21   [provider]  ALBUTEROL IN 2 puff 2 TIMES DAILY (route: inhalation) 01/22/21 05/25/21  [provider]  CLONAZEPAM PO 0.5 tablet AS DIRECTED (route: oral) 12/11/20 05/25/21  [provider]    Family History Family  History  Problem Relation Age of Onset   Hypertension Mother    Diabetes Mother    Cancer Mother    Hypertension Father    Diabetes Father    Heart failure Father     Social History Social History   Tobacco Use   Smoking status: Former    Types: Cigarettes    Quit date: 03/07/2021    Years since quitting: 0.3   Smokeless tobacco: Never  Vaping Use   Vaping Use: Never used  Substance Use Topics   Alcohol use: Never   Drug use: Never     Allergies   Penicillins, Morphine, and Nsaids   Review of Systems Review of Systems  Constitutional:  Positive for fatigue. Negative for chills and fever.  Gastrointestinal:  Positive for abdominal pain and nausea. Negative for vomiting.  Genitourinary:  Positive for dysuria and frequency. Negative for hematuria.  Musculoskeletal:  Positive for back pain.  Skin:  Negative for rash.  Hematological: Negative.   Psychiatric/Behavioral: Negative.      Physical Exam Triage Vital Signs ED Triage Vitals  Enc Vitals Group     BP 07/12/21 1744 (!) 147/76     Pulse Rate 07/12/21 1744 70     Resp 07/12/21 1744 18     Temp 07/12/21 1744 99 F (37.2 C)     Temp Source 07/12/21 1744 Oral     SpO2 07/12/21 1744 98 %     Weight --      Height --      Head Circumference --      Peak Flow --      Pain Score 07/12/21 1742 2     Pain Loc --      Pain Edu? --      Excl. in GC? --    No data found.  Updated Vital Signs BP (!) 147/76 (BP Location: Right Arm)   Pulse 70   Temp 99 F (37.2 C) (Oral)   Resp 18   SpO2 98%   Visual Acuity Right Eye Distance:   Left Eye Distance:   Bilateral Distance:    Right Eye Near:   Left Eye Near:    Bilateral Near:     Physical Exam Vitals and nursing note reviewed.  Constitutional:      General: She is not in acute distress.    Appearance: Normal appearance. She is ill-appearing.  HENT:     Head: Normocephalic and atraumatic.  Cardiovascular:     Rate and Rhythm: Normal rate and  regular rhythm.     Pulses: Normal pulses.     Heart sounds: Normal heart sounds. No murmur heard.   No gallop.  Pulmonary:     Effort: Pulmonary effort is normal.     Breath sounds: Normal breath sounds. No wheezing,  rhonchi or rales.  Abdominal:     Tenderness: There is abdominal tenderness. There is no right CVA tenderness, left CVA tenderness, guarding or rebound.  Skin:    General: Skin is warm and dry.     Capillary Refill: Capillary refill takes less than 2 seconds.     Findings: No erythema or rash.  Neurological:     General: No focal deficit present.     Mental Status: She is alert and oriented to person, place, and time.  Psychiatric:        Mood and Affect: Mood normal.        Behavior: Behavior normal.        Thought Content: Thought content normal.        Judgment: Judgment normal.     UC Treatments / Results  Labs (all labs ordered are listed, but only abnormal results are displayed) Labs Reviewed  POCT URINALYSIS DIP (DEVICE) - Abnormal; Notable for the following components:      Result Value   Glucose, UA 100 (*)    Bilirubin Urine SMALL (*)    Ketones, ur 15 (*)    Hgb urine dipstick LARGE (*)    Protein, ur >=300 (*)    Leukocytes,Ua LARGE (*)    All other components within normal limits  URINE CULTURE  POCT URINALYSIS DIPSTICK, ED / UC    EKG   Radiology No results found.  Procedures Procedures (including critical care time)  Medications Ordered in UC Medications - No data to display  Initial Impression / Assessment and Plan / UC Course  I have reviewed the triage vital signs and the nursing notes.  Pertinent labs & imaging results that were available during my care of the patient were reviewed by me and considered in my medical decision making (see chart for details).  Patient is a very pleasant though ill-appearing 55 year old female here for evaluation of urinary complaints as outlined in the HPI above.  Patient is an HD patient who  makes urine about 3 times a day and noticed that it became cloudy yesterday.  The sample she gave is very cloudy and puslike with a strong odor.  Patient's physical exam reveals a benign cardiopulmonary exam with clear lung sounds in all fields.  Patient has no CVA tenderness on exam.  Patient does have mild suprapubic tenderness with palpation without guarding or rebound.  Urinalysis collected at triage.  UA shows 100 glucose, small bilirubin, 15 ketones, large blood, greater than 300 protein, large leukocytes but is nitrite negative.  Specific gravity 1.025.  We will send urine for culture.  Will treat patient with cefdinir 300 mg 3 times a week postdialysis and on Sunday for total of 4 doses.  ER precautions reviewed with patient. Final Clinical Impressions(s) / UC Diagnoses   Final diagnoses:  Lower urinary tract infectious disease     Discharge Instructions      Take the cefdinir following each dialysis treatment.  That would be tonight, Wednesday after dialysis, Friday after dialysis, and take your final dose on Sunday.  If you develop any fever, rigors or chills, vomiting, or increase in malaise please go to the ER for evaluation.     ED Prescriptions     Medication Sig Dispense Auth. Provider   cefdinir (OMNICEF) 300 MG capsule Take 1 capsule (300 mg total) by mouth 3 (three) times a week. Take the cefdinir Monday, Wednesday, Friday after each dialysis treatment and your final dose on Sunday. 4 capsule Alycia Rossetti,  Riki Rusk, NP      PDMP not reviewed this encounter.   Becky Augusta, NP 07/12/21 [phone]

## 2021-07-14 MED ORDER — SERTRALINE 50 MG TABLET
ORAL_TABLET | 0 refills | 0 days | Status: CP
Start: 2021-07-14 — End: ?

## 2021-07-15 LAB — URINE CULTURE: Culture: >=100000 — AB

## 2021-07-16 DIAGNOSIS — N186 End stage renal disease: Principal | ICD-10-CM

## 2021-07-16 DIAGNOSIS — F331 Major depressive disorder, recurrent, moderate: Principal | ICD-10-CM

## 2021-07-16 MED ORDER — ASPIRIN 81 MG TABLET,DELAYED RELEASE
ORAL_TABLET | Freq: Every day | ORAL | 0 refills | 90 days | Status: CP
Start: 2021-07-16 — End: 2021-10-14
  Filled 2021-07-20: qty 90, 90d supply, fill #0

## 2021-07-16 MED ORDER — BUMETANIDE 2 MG TABLET
ORAL_TABLET | Freq: Two times a day (BID) | ORAL | 0 refills | 30.00000 days | Status: CP
Start: 2021-07-16 — End: ?

## 2021-07-16 MED ORDER — HYDROXYZINE HCL 50 MG TABLET
ORAL_TABLET | 0 refills | 0 days | Status: CP
Start: 2021-07-16 — End: ?

## 2021-07-20 MED FILL — MELATONIN 3 MG TABLET: ORAL | 100 days supply | Qty: 100 | Fill #2

## 2021-07-21 DIAGNOSIS — G629 Polyneuropathy, unspecified: Principal | ICD-10-CM

## 2021-07-21 MED ORDER — PREGABALIN 100 MG CAPSULE
ORAL_CAPSULE | 0 refills | 0 days
Start: 2021-07-21 — End: ?

## 2021-07-25 MED ORDER — PREGABALIN 100 MG CAPSULE
ORAL_CAPSULE | 0 refills | 0 days
Start: 2021-07-25 — End: ?

## 2021-07-27 ENCOUNTER — Telehealth
Admit: 2021-07-27 | Discharge: 2021-07-28 | Payer: MEDICARE | Attending: Psychiatric/Mental Health | Primary: Psychiatric/Mental Health

## 2021-07-27 ENCOUNTER — Telehealth
Admit: 2021-07-27 | Discharge: 2021-07-28 | Payer: MEDICARE | Attending: Student in an Organized Health Care Education/Training Program | Primary: Student in an Organized Health Care Education/Training Program

## 2021-07-27 ENCOUNTER — Telehealth: Admit: 2021-07-27 | Discharge: 2021-07-28 | Payer: MEDICARE | Attending: Clinical | Primary: Clinical

## 2021-07-27 DIAGNOSIS — E1122 Type 2 diabetes mellitus with diabetic chronic kidney disease: Principal | ICD-10-CM

## 2021-07-27 DIAGNOSIS — Z992 Dependence on renal dialysis: Principal | ICD-10-CM

## 2021-07-27 DIAGNOSIS — Z794 Long term (current) use of insulin: Principal | ICD-10-CM

## 2021-07-27 DIAGNOSIS — F3175 Bipolar disorder, in partial remission, most recent episode depressed: Principal | ICD-10-CM

## 2021-07-27 DIAGNOSIS — K259 Gastric ulcer, unspecified as acute or chronic, without hemorrhage or perforation: Principal | ICD-10-CM

## 2021-07-27 DIAGNOSIS — I10 Essential (primary) hypertension: Principal | ICD-10-CM

## 2021-07-27 DIAGNOSIS — F331 Major depressive disorder, recurrent, moderate: Principal | ICD-10-CM

## 2021-07-27 DIAGNOSIS — M545 Low back pain, unspecified back pain laterality, unspecified chronicity, unspecified whether sciatica present: Principal | ICD-10-CM

## 2021-07-27 DIAGNOSIS — F17200 Nicotine dependence, unspecified, uncomplicated: Principal | ICD-10-CM

## 2021-07-27 DIAGNOSIS — G629 Polyneuropathy, unspecified: Principal | ICD-10-CM

## 2021-07-27 DIAGNOSIS — E785 Hyperlipidemia, unspecified: Principal | ICD-10-CM

## 2021-07-27 DIAGNOSIS — N186 End stage renal disease: Principal | ICD-10-CM

## 2021-07-27 DIAGNOSIS — S161XXA Strain of muscle, fascia and tendon at neck level, initial encounter: Principal | ICD-10-CM

## 2021-07-27 MED ORDER — ATORVASTATIN 40 MG TABLET
ORAL_TABLET | Freq: Every day | ORAL | 3 refills | 90.00000 days | Status: CP
Start: 2021-07-27 — End: 2022-07-27

## 2021-07-27 MED ORDER — LAMOTRIGINE 150 MG TABLET
ORAL_TABLET | Freq: Every day | ORAL | 0 refills | 90 days | Status: CP
Start: 2021-07-27 — End: 2021-10-25

## 2021-07-27 MED ORDER — QUETIAPINE 300 MG TABLET
ORAL_TABLET | Freq: Every evening | ORAL | 0 refills | 90 days | Status: CP
Start: 2021-07-27 — End: 2021-10-25

## 2021-07-27 MED ORDER — AMLODIPINE 10 MG TABLET
ORAL_TABLET | Freq: Every day | ORAL | 3 refills | 90 days | Status: CP
Start: 2021-07-27 — End: 2022-07-27

## 2021-07-27 MED ORDER — OMEPRAZOLE 40 MG CAPSULE,DELAYED RELEASE
ORAL_CAPSULE | Freq: Every day | ORAL | 3 refills | 90.00000 days | Status: CP
Start: 2021-07-27 — End: 2022-07-27

## 2021-07-27 MED ORDER — PREGABALIN 200 MG CAPSULE
ORAL_CAPSULE | Freq: Every evening | ORAL | 2 refills | 30.00000 days | Status: CP
Start: 2021-07-27 — End: 2021-07-27

## 2021-07-27 MED ORDER — OZEMPIC 0.25 MG OR 0.5 MG (2 MG/1.5 ML) SUBCUTANEOUS PEN INJECTOR
SUBCUTANEOUS | 0 refills | 56 days | Status: CP
Start: 2021-07-27 — End: 2021-08-26

## 2021-07-27 MED ORDER — CYCLOBENZAPRINE 5 MG TABLET
ORAL_TABLET | Freq: Every evening | ORAL | 3 refills | 30 days | Status: CP | PRN
Start: 2021-07-27 — End: 2021-08-26

## 2021-07-28 ENCOUNTER — Inpatient Hospital Stay: Admit: 2021-07-28 | Payer: MEDICARE

## 2021-07-29 DIAGNOSIS — N186 End stage renal disease: Principal | ICD-10-CM

## 2021-07-29 DIAGNOSIS — Z992 Dependence on renal dialysis: Principal | ICD-10-CM

## 2021-07-29 DIAGNOSIS — Z794 Long term (current) use of insulin: Principal | ICD-10-CM

## 2021-07-29 DIAGNOSIS — E1122 Type 2 diabetes mellitus with diabetic chronic kidney disease: Principal | ICD-10-CM

## 2021-07-29 MED ORDER — OZEMPIC 0.25 MG OR 0.5 MG (2 MG/1.5 ML) SUBCUTANEOUS PEN INJECTOR
0 refills | 0 days
Start: 2021-07-29 — End: ?

## 2021-08-01 MED ORDER — LAMOTRIGINE 100 MG TABLET
ORAL_TABLET | 0 refills | 0 days
Start: 2021-08-01 — End: ?

## 2021-08-05 ENCOUNTER — Ambulatory Visit: Admit: 2021-08-05 | Payer: MEDICARE

## 2021-08-05 DIAGNOSIS — E1122 Type 2 diabetes mellitus with diabetic chronic kidney disease: Principal | ICD-10-CM

## 2021-08-05 DIAGNOSIS — G629 Polyneuropathy, unspecified: Principal | ICD-10-CM

## 2021-08-05 DIAGNOSIS — Z794 Long term (current) use of insulin: Principal | ICD-10-CM

## 2021-08-05 DIAGNOSIS — E785 Hyperlipidemia, unspecified: Principal | ICD-10-CM

## 2021-08-05 DIAGNOSIS — K259 Gastric ulcer, unspecified as acute or chronic, without hemorrhage or perforation: Principal | ICD-10-CM

## 2021-08-05 DIAGNOSIS — N186 End stage renal disease: Principal | ICD-10-CM

## 2021-08-05 DIAGNOSIS — Z992 Dependence on renal dialysis: Principal | ICD-10-CM

## 2021-08-05 MED ORDER — OZEMPIC 0.25 MG OR 0.5 MG (2 MG/1.5 ML) SUBCUTANEOUS PEN INJECTOR
SUBCUTANEOUS | 0 refills | 56 days
Start: 2021-08-05 — End: 2021-09-04

## 2021-08-05 MED ORDER — ATORVASTATIN 40 MG TABLET
ORAL_TABLET | Freq: Every day | ORAL | 3 refills | 90 days
Start: 2021-08-05 — End: 2022-08-05

## 2021-08-05 MED ORDER — OMEPRAZOLE 40 MG CAPSULE,DELAYED RELEASE
ORAL_CAPSULE | Freq: Every day | ORAL | 3 refills | 90 days
Start: 2021-08-05 — End: 2022-08-05

## 2021-08-05 MED ORDER — PREGABALIN 200 MG CAPSULE
ORAL_CAPSULE | Freq: Every evening | ORAL | 3 refills | 90 days
Start: 2021-08-05 — End: 2022-08-05

## 2021-08-06 DIAGNOSIS — G629 Polyneuropathy, unspecified: Principal | ICD-10-CM

## 2021-08-06 MED ORDER — PREGABALIN 200 MG CAPSULE
ORAL_CAPSULE | Freq: Every evening | ORAL | 3 refills | 90.00000 days | Status: CP
Start: 2021-08-06 — End: 2022-08-06

## 2021-08-06 MED ORDER — OMEPRAZOLE 40 MG CAPSULE,DELAYED RELEASE
ORAL_CAPSULE | Freq: Every day | ORAL | 3 refills | 90 days
Start: 2021-08-06 — End: 2022-08-06

## 2021-08-12 ENCOUNTER — Ambulatory Visit: Admit: 2021-08-12 | Discharge: 2021-08-13 | Payer: MEDICARE

## 2021-08-12 DIAGNOSIS — I259 Chronic ischemic heart disease, unspecified: Principal | ICD-10-CM

## 2021-08-12 DIAGNOSIS — I240 Acute coronary thrombosis not resulting in myocardial infarction: Principal | ICD-10-CM

## 2021-08-12 DIAGNOSIS — R002 Palpitations: Principal | ICD-10-CM

## 2021-08-12 DIAGNOSIS — N186 End stage renal disease: Principal | ICD-10-CM

## 2021-08-12 DIAGNOSIS — I10 Essential (primary) hypertension: Principal | ICD-10-CM

## 2021-08-12 MED ORDER — METOPROLOL SUCCINATE ER 50 MG TABLET,EXTENDED RELEASE 24 HR
ORAL_TABLET | Freq: Every day | ORAL | 3 refills | 90 days | Status: CP
Start: 2021-08-12 — End: ?

## 2021-08-20 DIAGNOSIS — Z114 Encounter for screening for human immunodeficiency virus [HIV]: Principal | ICD-10-CM

## 2021-08-20 DIAGNOSIS — Z992 Dependence on renal dialysis: Principal | ICD-10-CM

## 2021-08-20 DIAGNOSIS — Z7289 Other problems related to lifestyle: Principal | ICD-10-CM

## 2021-08-20 DIAGNOSIS — N186 End stage renal disease: Principal | ICD-10-CM

## 2021-08-20 DIAGNOSIS — E1122 Type 2 diabetes mellitus with diabetic chronic kidney disease: Principal | ICD-10-CM

## 2021-08-20 DIAGNOSIS — Z01818 Encounter for other preprocedural examination: Principal | ICD-10-CM

## 2021-08-20 DIAGNOSIS — Z794 Long term (current) use of insulin: Principal | ICD-10-CM

## 2021-08-26 ENCOUNTER — Ambulatory Visit: Admit: 2021-08-26 | Discharge: 2021-08-26 | Payer: MEDICARE

## 2021-08-26 ENCOUNTER — Institutional Professional Consult (permissible substitution): Admit: 2021-08-26 | Discharge: 2021-08-26 | Payer: MEDICARE

## 2021-08-26 ENCOUNTER — Ambulatory Visit: Admit: 2021-08-26 | Discharge: 2021-08-27 | Payer: MEDICARE

## 2021-08-26 DIAGNOSIS — N186 End stage renal disease: Principal | ICD-10-CM

## 2021-09-01 DIAGNOSIS — Z01818 Encounter for other preprocedural examination: Principal | ICD-10-CM

## 2021-09-01 DIAGNOSIS — N186 End stage renal disease: Principal | ICD-10-CM

## 2021-09-01 DIAGNOSIS — Z01810 Encounter for preprocedural cardiovascular examination: Principal | ICD-10-CM

## 2021-09-01 DIAGNOSIS — R93429 Abnormal radiologic findings on diagnostic imaging of unspecified kidney: Principal | ICD-10-CM

## 2021-09-01 DIAGNOSIS — R7989 Other specified abnormal findings of blood chemistry: Principal | ICD-10-CM

## 2021-09-06 ENCOUNTER — Ambulatory Visit: Admit: 2021-09-06 | Discharge: 2021-09-07 | Payer: MEDICARE

## 2021-09-16 ENCOUNTER — Telehealth
Admit: 2021-09-16 | Discharge: 2021-09-17 | Payer: MEDICARE | Attending: Student in an Organized Health Care Education/Training Program | Primary: Student in an Organized Health Care Education/Training Program

## 2021-09-16 DIAGNOSIS — N186 End stage renal disease: Principal | ICD-10-CM

## 2021-09-16 DIAGNOSIS — Z794 Long term (current) use of insulin: Principal | ICD-10-CM

## 2021-09-16 DIAGNOSIS — Z1231 Encounter for screening mammogram for malignant neoplasm of breast: Principal | ICD-10-CM

## 2021-09-16 DIAGNOSIS — E1122 Type 2 diabetes mellitus with diabetic chronic kidney disease: Principal | ICD-10-CM

## 2021-09-16 DIAGNOSIS — E785 Hyperlipidemia, unspecified: Principal | ICD-10-CM

## 2021-09-16 DIAGNOSIS — Z992 Dependence on renal dialysis: Principal | ICD-10-CM

## 2021-09-16 DIAGNOSIS — I10 Essential (primary) hypertension: Principal | ICD-10-CM

## 2021-09-16 DIAGNOSIS — F509 Eating disorder, unspecified: Principal | ICD-10-CM

## 2021-09-16 DIAGNOSIS — F17200 Nicotine dependence, unspecified, uncomplicated: Principal | ICD-10-CM

## 2021-09-16 DIAGNOSIS — Z0000 Encounter for general adult medical examination without abnormal findings: Principal | ICD-10-CM

## 2021-09-16 DIAGNOSIS — F331 Major depressive disorder, recurrent, moderate: Principal | ICD-10-CM

## 2021-09-16 DIAGNOSIS — Z8619 Personal history of other infectious and parasitic diseases: Principal | ICD-10-CM

## 2021-09-16 MED ORDER — ATORVASTATIN 40 MG TABLET
ORAL_TABLET | Freq: Every day | ORAL | 3 refills | 90 days | Status: CP
Start: 2021-09-16 — End: 2022-09-16

## 2021-09-16 MED ORDER — OZEMPIC 0.25 MG OR 0.5 MG (2 MG/1.5 ML) SUBCUTANEOUS PEN INJECTOR
SUBCUTANEOUS | 0 refills | 84 days | Status: CP
Start: 2021-09-16 — End: 2021-12-15

## 2021-10-11 DIAGNOSIS — F331 Major depressive disorder, recurrent, moderate: Principal | ICD-10-CM

## 2021-10-11 MED ORDER — HYDROXYZINE HCL 50 MG TABLET
ORAL_TABLET | 0 refills | 0 days | Status: CP
Start: 2021-10-11 — End: ?

## 2021-10-12 MED ORDER — HYDROXYZINE PAMOATE 50 MG CAPSULE
ORAL_CAPSULE | Freq: Two times a day (BID) | ORAL | 2 refills | 30.00000 days | Status: CP | PRN
Start: 2021-10-12 — End: 2022-01-10

## 2021-10-16 DIAGNOSIS — F3175 Bipolar disorder, in partial remission, most recent episode depressed: Principal | ICD-10-CM

## 2021-10-16 DIAGNOSIS — F331 Major depressive disorder, recurrent, moderate: Principal | ICD-10-CM

## 2021-10-16 MED ORDER — QUETIAPINE 300 MG TABLET
ORAL_TABLET | 0 refills | 0 days | Status: CP
Start: 2021-10-16 — End: ?

## 2021-11-02 MED ORDER — LAMOTRIGINE 150 MG TABLET
ORAL_TABLET | 0 refills | 0 days
Start: 2021-11-02 — End: ?

## 2021-11-03 MED ORDER — LAMOTRIGINE 150 MG TABLET
ORAL_TABLET | ORAL | 0 refills | 0.00000 days | Status: CP
Start: 2021-11-03 — End: ?

## 2021-11-04 MED ORDER — LAMOTRIGINE 150 MG TABLET
ORAL_TABLET | 0 refills | 0 days
Start: 2021-11-04 — End: ?

## 2021-11-15 DIAGNOSIS — S161XXA Strain of muscle, fascia and tendon at neck level, initial encounter: Principal | ICD-10-CM

## 2021-11-15 MED ORDER — CYCLOBENZAPRINE 5 MG TABLET
ORAL_TABLET | 3 refills | 0 days
Start: 2021-11-15 — End: ?

## 2021-11-16 ENCOUNTER — Ambulatory Visit: Admit: 2021-11-16 | Payer: MEDICARE

## 2021-11-16 ENCOUNTER — Ambulatory Visit: Admit: 2021-11-16 | Payer: MEDICARE | Attending: Psychologist | Primary: Psychologist

## 2021-11-16 MED ORDER — CYCLOBENZAPRINE 5 MG TABLET
ORAL_TABLET | 3 refills | 0 days | Status: CP
Start: 2021-11-16 — End: ?

## 2021-12-16 ENCOUNTER — Ambulatory Visit: Admit: 2021-12-16 | Payer: MEDICARE

## 2021-12-17 DIAGNOSIS — E1122 Type 2 diabetes mellitus with diabetic chronic kidney disease: Principal | ICD-10-CM

## 2021-12-17 DIAGNOSIS — Z794 Long term (current) use of insulin: Principal | ICD-10-CM

## 2021-12-17 DIAGNOSIS — Z992 Dependence on renal dialysis: Principal | ICD-10-CM

## 2021-12-17 DIAGNOSIS — N186 End stage renal disease: Principal | ICD-10-CM

## 2021-12-17 MED ORDER — OZEMPIC 0.25 MG OR 0.5 MG (2 MG/1.5 ML) SUBCUTANEOUS PEN INJECTOR
0 refills | 0 days | Status: CP
Start: 2021-12-17 — End: ?

## 2021-12-23 DIAGNOSIS — N186 End stage renal disease: Principal | ICD-10-CM

## 2021-12-23 DIAGNOSIS — Z992 Dependence on renal dialysis: Principal | ICD-10-CM

## 2021-12-23 DIAGNOSIS — T82898D Other specified complication of vascular prosthetic devices, implants and grafts, subsequent encounter: Principal | ICD-10-CM

## 2021-12-25 ENCOUNTER — Ambulatory Visit
Admission: EM | Admit: 2021-12-25 | Discharge: 2021-12-25 | Disposition: A | Discharge status: Home / Self Care | Payer: Medicare PPO | Attending: Emergency Medicine | Admitting: Emergency Medicine

## 2021-12-25 ENCOUNTER — Encounter: Payer: Self-pay | Admitting: Emergency Medicine

## 2021-12-25 ENCOUNTER — Other Ambulatory Visit: Payer: Self-pay

## 2021-12-25 DIAGNOSIS — N390 Urinary tract infection, site not specified: Secondary | ICD-10-CM

## 2021-12-25 DIAGNOSIS — S46912A Strain of unspecified muscle, fascia and tendon at shoulder and upper arm level, left arm, initial encounter: Secondary | ICD-10-CM | POA: Diagnosis present

## 2021-12-25 LAB — URINALYSIS, COMPLETE (UACMP) WITH MICROSCOPIC
Glucose, UA: NEGATIVE mg/dL
Nitrite: NEGATIVE
Protein, ur: >300 mg/dL — AB
Specific Gravity, Urine: 1.02 (ref 1.005–1.030)
WBC, UA: >50 WBC/hpf (ref 0–5)
pH: 6 (ref 5.0–8.0)

## 2021-12-25 MED ORDER — CIPROFLOXACIN HCL 500 MG PO TABS: 500.0000 mg | ORAL_TABLET | Freq: Two times a day (BID) | ORAL | 0 refills | Status: AC

## 2021-12-25 NOTE — ED Triage Notes (Signed)
Pt c/o dysuria, urinary frequency, and odor. Started about a week ago. Denies lower back pain or fever.  Also c/o left shoulder pain that radiates down to her finger tips. Started about 3 days ago. She states the pain is worse when she lays down. Pt states she thinks she injured her shoulder picking up her grandson.

## 2021-12-25 NOTE — Discharge Instructions (Addendum)
Rest, take antibiotic as directed.  Follow-up with your urologist as scheduled.  Make sure you keep your dialysis appointment Monday.  If you develop fever, worsening symptoms, unable to keep antibiotic down ,please go immediately to the emergency room you will need further evaluation and IV antibiotics at that time. Keep an eye on your Blood glucose as it may go up with infection as well. May use otc lidocaine patches for left shoulder strain.

## 2021-12-25 NOTE — ED Provider Notes (Signed)
[facility]    CSN: [id number] Arrival date & time: 12/25/21  1302      History   Chief Complaint Chief Complaint  Patient presents with   Dysuria   Shoulder Pain    left    HPI Nicole Sheppard is a 56 y.o. female.   Pt c/o dysuria, urinary frequency, and odor. Started about a week ago. Denies lower back pain or fever.  Also c/o left shoulder pain that radiates down to her finger tips. Started about 3 days ago. She states the pain is worse when she lays down. Pt states she thinks she injured her shoulder picking up her grandson.  Patient states her last UTI was 07/12/2021 seen in this urgent care for same.  The history is provided by the patient. No language interpreter was used.   Past Medical History:  Diagnosis Date   Diabetes mellitus without complication (HCC)    Heart disease    Hypertension    Kidney disease    Thyroid disease     Patient Active Problem List   Diagnosis Date Noted   Acute UTI 12/25/2021   Left shoulder strain, initial encounter 12/25/2021    Past Surgical History:  Procedure Laterality Date   ABDOMINAL HYSTERECTOMY     AV FISTULA PLACEMENT     BACK SURGERY     COSMETIC SURGERY     GALLBLADDER SURGERY     GASTRIC BYPASS     HERNIA REPAIR      OB History   No obstetric history on file.      Home Medications    Prior to Admission medications   Medication Sig Start Date End Date Taking? Authorizing Provider  atorvastatin (LIPITOR) 40 MG tablet Take 1 tablet by mouth daily. 04/19/21  Yes [provider]  bumetanide (BUMEX) 1 MG tablet 2 tablet 2 TIMES DAILY (route: oral) 08/25/20  Yes [provider]  carvedilol (COREG) 25 MG tablet Take 1 tablet by mouth 2 (two) times daily. 07/04/18  Yes [provider]  ciprofloxacin (CIPRO) 500 MG tablet Take 1 tablet (500 mg total) by mouth 2 (two) times daily for 7 days. 12/25/21 01/01/22 Yes Shalamar Crays, Para March, NP  fluticasone (FLONASE) 50 MCG/ACT nasal spray 1 spray  DAILY (route: nasal) 01/22/21  Yes [provider]  hydrOXYzine (ATARAX/VISTARIL) 50 MG tablet Take 50 mg by mouth 2 (two) times daily. 04/13/21  Yes [provider]  insulin glargine (LANTUS SOLOSTAR) 100 UNIT/ML Solostar Pen 2 unit BEDTIME (route: subcutaneous) 01/15/21  Yes [provider]  lamoTRIgine (LAMICTAL) 100 MG tablet Take 100 mg by mouth daily. 05/04/21  Yes [provider]  levothyroxine (SYNTHROID) 75 MCG tablet 1 tablet DAILY (route: oral) 01/15/21  Yes [provider]  metoCLOPramide (REGLAN) 5 MG tablet Take by mouth. 09/19/18  Yes [provider]  metoprolol tartrate (LOPRESSOR) 100 MG tablet 1 tablet 2 TIMES DAILY (route: oral) 11/03/20  Yes [provider]  pregabalin (LYRICA) 75 MG capsule Take 75 mg by mouth daily. 01/05/21  Yes [provider]  QUEtiapine (SEROQUEL XR) 50 MG TB24 24 hr tablet Take by mouth. 09/19/18  Yes [provider]  sertraline (ZOLOFT) 50 MG tablet Take 3 tablets by mouth daily. 04/13/21  Yes [provider]  Aspirin Buf,CaCarb-MgCarb-MgO, 81 MG TABS Take 1 tablet by mouth daily. 05/09/18   [provider]  cefdinir (OMNICEF) 300 MG capsule Take 1 capsule (300 mg total) by mouth 3 (three) times a week. Take the  cefdinir Monday, Wednesday, Friday after each dialysis treatment and your final dose on Sunday. 07/12/21   Becky Augusta, NP  EPCLUSA 400-100 MG TABS Take 1 tablet by mouth daily. 04/19/21   [provider]  insulin lispro (HUMALOG) 100 UNIT/ML KwikPen Inject 10 Units into the skin 3 (three) times daily. 05/25/21   Tommie Sams, DO  losartan (COZAAR) 25 MG tablet Take 25 mg by mouth daily. 04/21/21   [provider]  NIFEdipine (PROCARDIA XL/NIFEDICAL-XL) 90 MG 24 hr tablet Take 90 mg by mouth daily. 04/23/21   [provider]  sevelamer carbonate (RENVELA) 800 MG tablet Take by mouth. 05/03/21   [provider]  ALBUTEROL IN 2 puff 2  TIMES DAILY (route: inhalation) 01/22/21 05/25/21  [provider]  CLONAZEPAM PO 0.5 tablet AS DIRECTED (route: oral) 12/11/20 05/25/21  [provider]    Family History Family History  Problem Relation Age of Onset   Hypertension Mother    Diabetes Mother    Cancer Mother    Hypertension Father    Diabetes Father    Heart failure Father     Social History Social History   Tobacco Use   Smoking status: Every Day    Types: Cigarettes    Last attempt to quit: 03/07/2021    Years since quitting: 0.8   Smokeless tobacco: Never  Vaping Use   Vaping Use: Never used  Substance Use Topics   Alcohol use: Never   Drug use: Never     Allergies   Penicillins, Morphine, and Nsaids   Review of Systems Review of Systems  Constitutional:  Negative for fever.  Gastrointestinal:  Negative for abdominal pain, nausea and vomiting.  Genitourinary:  Positive for dysuria.  All other systems reviewed and are negative.   Physical Exam Triage Vital Signs ED Triage Vitals  Enc Vitals Group     BP 12/25/21 1347 (!) 166/94     Pulse Rate 12/25/21 1347 80     Resp 12/25/21 1347 18     Temp 12/25/21 1347 98.8 F (37.1 C)     Temp Source 12/25/21 1347 Oral     SpO2 12/25/21 1347 97 %     Weight 12/25/21 1344 180 lb (81.6 kg)     Height 12/25/21 1344 5' 5.5" (1.664 m)     Head Circumference --      Peak Flow --      Pain Score 12/25/21 1343 5     Pain Loc --      Pain Edu? --      Excl. in GC? --    No data found.  Updated Vital Signs BP (!) 166/94 (BP Location: Left Arm)    Pulse 80    Temp 98.8 F (37.1 C) (Oral)    Resp 18    Ht 5' 5.5" (1.664 m)    Wt 180 lb (81.6 kg)    SpO2 97%    BMI 29.50 kg/m   Visual Acuity Right Eye Distance:   Left Eye Distance:   Bilateral Distance:    Right Eye Near:   Left Eye Near:    Bilateral Near:     Physical Exam Vitals and nursing note reviewed.  Constitutional:      General: She is not in acute distress.     Appearance: She is well-developed and well-groomed.    HENT:     Head: Normocephalic and atraumatic.  Eyes:     Conjunctiva/sclera: Conjunctivae normal.  Cardiovascular:  Rate and Rhythm: Normal rate and regular rhythm.     Pulses: Normal pulses.          Radial pulses are 2+ on the left side.     Heart sounds: No murmur heard. Pulmonary:     Effort: Pulmonary effort is normal. No respiratory distress.     Breath sounds: Normal breath sounds.  Abdominal:     Palpations: Abdomen is soft.     Tenderness: There is no abdominal tenderness.  Musculoskeletal:        General: No swelling.       Arms:     Cervical back: Neck supple.  Skin:    General: Skin is warm and dry.     Capillary Refill: Capillary refill takes less than 2 seconds.  Neurological:     General: No focal deficit present.     Mental Status: She is alert and oriented to person, place, and time.     GCS: GCS eye subscore is 4. GCS verbal subscore is 5. GCS motor subscore is 6.  Psychiatric:        Attention and Perception: Attention normal.        Mood and Affect: Mood normal.        Speech: Speech normal.        Behavior: Behavior normal. Behavior is cooperative.     UC Treatments / Results  Labs (all labs ordered are listed, but only abnormal results are displayed) Labs Reviewed  URINALYSIS, COMPLETE (UACMP) WITH MICROSCOPIC - Abnormal; Notable for the following components:      Result Value   APPearance TURBID (*)    Hgb urine dipstick MODERATE (*)    Bilirubin Urine SMALL (*)    Ketones, ur TRACE (*)    Protein, ur >300 (*)    Leukocytes,Ua LARGE (*)    Non Squamous Epithelial PRESENT (*)    Bacteria, UA MANY (*)    All other components within normal limits  URINE CULTURE    EKG   Radiology No results found.  Procedures Procedures (including critical care time)  Medications Ordered in UC Medications - No data to display  Initial Impression / Assessment and Plan / UC Course  I have  reviewed the triage vital signs and the nursing notes.  Pertinent labs & imaging results that were available during my care of the patient were reviewed by me and considered in my medical decision making (see chart for details).     Ddx: UTI, pyelonephritis, kidney failure, shoulder strain,cervical radiculopathy Final Clinical Impressions(s) / UC Diagnoses   Final diagnoses:  Acute UTI  Left shoulder strain, initial encounter     Discharge Instructions      Rest, take antibiotic as directed.  Follow-up with your urologist as scheduled.  Make sure you keep your dialysis appointment Monday.  If you develop fever, worsening symptoms, unable to keep antibiotic down ,please go immediately to the emergency room you will need further evaluation and IV antibiotics at that time. Keep an eye on your Blood glucose as it may go up with infection as well. May use otc lidocaine patches for left shoulder strain.     ED Prescriptions     Medication Sig Dispense Auth. Provider   ciprofloxacin (CIPRO) 500 MG tablet Take 1 tablet (500 mg total) by mouth 2 (two) times daily for 7 days. 14 tablet Carlyle Mcelrath, Para MarchJeanette, NP      PDMP not reviewed this encounter.   Clancy Gourdefelice, Keishawna Carranza, NP 12/25/21 1457

## 2021-12-27 LAB — URINE CULTURE: Culture: >=100000 — AB

## 2022-01-06 ENCOUNTER — Ambulatory Visit: Admit: 2022-01-06 | Discharge: 2022-01-08 | Disposition: A | Discharge status: Home / Self Care | Payer: MEDICARE

## 2022-01-07 MED ORDER — OZEMPIC 0.25 MG OR 0.5 MG (2 MG/1.5 ML) SUBCUTANEOUS PEN INJECTOR
SUBCUTANEOUS | 0 refills | 28 days | Status: CP
Start: 2022-01-07 — End: ?

## 2022-01-08 ENCOUNTER — Other Ambulatory Visit: Payer: Self-pay

## 2022-01-08 ENCOUNTER — Ambulatory Visit
Admission: EM | Admit: 2022-01-08 | Discharge: 2022-01-08 | Disposition: A | Discharge status: Home / Self Care | Payer: Medicare PPO | Attending: Emergency Medicine | Admitting: Emergency Medicine

## 2022-01-08 DIAGNOSIS — U071 COVID-19: Secondary | ICD-10-CM | POA: Diagnosis not present

## 2022-01-08 DIAGNOSIS — H9201 Otalgia, right ear: Secondary | ICD-10-CM

## 2022-01-08 DIAGNOSIS — R682 Dry mouth, unspecified: Secondary | ICD-10-CM | POA: Diagnosis not present

## 2022-01-08 MED ORDER — LIDOCAINE VISCOUS HCL 2 % MT SOLN: 15.0000 mL | OROMUCOSAL | 0 refills | Status: AC | PRN

## 2022-01-08 NOTE — ED Provider Notes (Signed)
[facility]    CSN: [id number] Arrival date & time: 01/08/22  1236      History   Chief Complaint Chief Complaint  Patient presents with   Ear Pain   Covid Positive    HPI Nicole Sheppard is a 56 y.o. female.   Patient presents with sharp right-sided ear pain occurring constantly for 2 days.  Has attempted use of Tylenol, heating pad which has not been helpful.  Home COVID test +5 days ago.  Has associated dry mouth, nasal congestion, rhinorrhea, fatigue, mild cough.  History of diabetes mellitus, hypertension, thyroid disease, kidney disease.    Past Medical History:  Diagnosis Date   Diabetes mellitus without complication (HCC)    Heart disease    Hypertension    Kidney disease    Thyroid disease     Patient Active Problem List   Diagnosis Date Noted   Acute UTI 12/25/2021   Left shoulder strain, initial encounter 12/25/2021    Past Surgical History:  Procedure Laterality Date   ABDOMINAL HYSTERECTOMY     AV FISTULA PLACEMENT     BACK SURGERY     COSMETIC SURGERY     GALLBLADDER SURGERY     GASTRIC BYPASS     HERNIA REPAIR      OB History   No obstetric history on file.      Home Medications    Prior to Admission medications   Medication Sig Start Date End Date Taking? Authorizing Provider  Aspirin Buf,CaCarb-MgCarb-MgO, 81 MG TABS Take 1 tablet by mouth daily. 05/09/18  Yes [provider]  atorvastatin (LIPITOR) 40 MG tablet Take 1 tablet by mouth daily. 04/19/21  Yes [provider]  bumetanide (BUMEX) 1 MG tablet 2 tablet 2 TIMES DAILY (route: oral) 08/25/20  Yes [provider]  carvedilol (COREG) 25 MG tablet Take 1 tablet by mouth 2 (two) times daily. 07/04/18  Yes [provider]  EPCLUSA 400-100 MG TABS Take 1 tablet by mouth daily. 04/19/21  Yes [provider]  fluticasone (FLONASE) 50 MCG/ACT nasal spray 1 spray DAILY (route: nasal) 01/22/21  Yes [provider]  hydrOXYzine  (ATARAX/VISTARIL) 50 MG tablet Take 50 mg by mouth 2 (two) times daily. 04/13/21  Yes [provider]  insulin glargine (LANTUS SOLOSTAR) 100 UNIT/ML Solostar Pen 2 unit BEDTIME (route: subcutaneous) 01/15/21  Yes [provider]  insulin lispro (HUMALOG) 100 UNIT/ML KwikPen Inject 10 Units into the skin 3 (three) times daily. 05/25/21  Yes Cook, Jayce G, DO  lamoTRIgine (LAMICTAL) 100 MG tablet Take 100 mg by mouth daily. 05/04/21  Yes [provider]  levothyroxine (SYNTHROID) 75 MCG tablet 1 tablet DAILY (route: oral) 01/15/21  Yes [provider]  losartan (COZAAR) 25 MG tablet Take 25 mg by mouth daily. 04/21/21  Yes [provider]  metoCLOPramide (REGLAN) 5 MG tablet Take by mouth. 09/19/18  Yes [provider]  metoprolol tartrate (LOPRESSOR) 100 MG tablet 1 tablet 2 TIMES DAILY (route: oral) 11/03/20  Yes [provider]  NIFEdipine (PROCARDIA XL/NIFEDICAL-XL) 90 MG 24 hr tablet Take 90 mg by mouth daily. 04/23/21  Yes [provider]  pregabalin (LYRICA) 75 MG capsule Take 75 mg by mouth daily. 01/05/21  Yes [provider]  QUEtiapine (SEROQUEL XR) 50 MG TB24 24 hr tablet Take by mouth. 09/19/18  Yes [provider]  sertraline (ZOLOFT) 50 MG tablet Take 3 tablets by mouth daily. 04/13/21  Yes [provider]  sevelamer carbonate (  RENVELA) 800 MG tablet Take by mouth. 05/03/21  Yes [provider]  cefdinir (OMNICEF) 300 MG capsule Take 1 capsule (300 mg total) by mouth 3 (three) times a week. Take the cefdinir Monday, Wednesday, Friday after each dialysis treatment and your final dose on Sunday. 07/12/21   Becky Augusta, NP  ALBUTEROL IN 2 puff 2 TIMES DAILY (route: inhalation) 01/22/21 05/25/21  [provider]  CLONAZEPAM PO 0.5 tablet AS DIRECTED (route: oral) 12/11/20 05/25/21  [provider]    Family History Family History  Problem Relation Age of Onset   Hypertension Mother     Diabetes Mother    Cancer Mother    Hypertension Father    Diabetes Father    Heart failure Father     Social History Social History   Tobacco Use   Smoking status: Every Day    Types: Cigarettes    Last attempt to quit: 03/07/2021    Years since quitting: 0.8   Smokeless tobacco: Never  Vaping Use   Vaping Use: Never used  Substance Use Topics   Alcohol use: Never   Drug use: Never     Allergies   Penicillins, Morphine, and Nsaids   Review of Systems Review of Systems  Constitutional: Negative.   HENT:  Positive for ear pain. Negative for congestion, dental problem, drooling, ear discharge, facial swelling, hearing loss, mouth sores, nosebleeds, postnasal drip, rhinorrhea, sinus pressure, sinus pain, sneezing, sore throat, tinnitus, trouble swallowing and voice change.   Respiratory: Negative.    Cardiovascular: Negative.   Skin: Negative.   Neurological: Negative.     Physical Exam Triage Vital Signs ED Triage Vitals  Enc Vitals Group     BP 01/08/22 1255 127/69     Pulse Rate 01/08/22 1255 69     Resp 01/08/22 1255 18     Temp 01/08/22 1255 99.1 F (37.3 C)     Temp Source 01/08/22 1255 Oral     SpO2 01/08/22 1255 100 %     Weight 01/08/22 1252 180 lb (81.6 kg)     Height 01/08/22 1252 5\' 5"  (1.651 m)     Head Circumference --      Peak Flow --      Pain Score 01/08/22 1251 7     Pain Loc --      Pain Edu? --      Excl. in GC? --    No data found.  Updated Vital Signs BP 127/69 (BP Location: Right Arm)    Pulse 69    Temp 99.1 F (37.3 C) (Oral)    Resp 18    Ht 5\' 5"  (1.651 m)    Wt 180 lb (81.6 kg)    SpO2 100%    BMI 29.95 kg/m   Visual Acuity Right Eye Distance:   Left Eye Distance:   Bilateral Distance:    Right Eye Near:   Left Eye Near:    Bilateral Near:     Physical Exam Constitutional:      Appearance: She is ill-appearing.  HENT:     Head: Normocephalic.     Right Ear: Tympanic membrane, ear canal and external ear normal.      Left Ear: Tympanic membrane, ear canal and external ear normal.     Nose: Congestion and rhinorrhea present.     Mouth/Throat:     Mouth: Mucous membranes are moist.     Pharynx: Posterior oropharyngeal erythema present.  Eyes:     Extraocular  Movements: Extraocular movements intact.  Cardiovascular:     Rate and Rhythm: Normal rate and regular rhythm.     Pulses: Normal pulses.     Heart sounds: Normal heart sounds.  Pulmonary:     Effort: Pulmonary effort is normal.     Breath sounds: Normal breath sounds.  Musculoskeletal:     Cervical back: Normal range of motion and neck supple.  Skin:    General: Skin is warm and dry.  Neurological:     Mental Status: She is alert and oriented to person, place, and time. Mental status is at baseline.  Psychiatric:        Mood and Affect: Mood normal.        Behavior: Behavior normal.     UC Treatments / Results  Labs (all labs ordered are listed, but only abnormal results are displayed) Labs Reviewed - No data to display  EKG   Radiology No results found.  Procedures Procedures (including critical care time)  Medications Ordered in UC Medications - No data to display  Initial Impression / Assessment and Plan / UC Course  I have reviewed the triage vital signs and the nursing notes.  Pertinent labs & imaging results that were available during my care of the patient were reviewed by me and considered in my medical decision making (see chart for details).  COVID-19 Otalgia right ear Dry mouth  Vital signs are stable, patient in no signs of distress, no signs of infection seen in the ear on exam, etiology of symptoms most likely related to congestion, discussed with patient, recommended beginning use of Mucinex and Flonase for management, lidocaine viscous prescribed for irritation due to dry mouth, may use over-the-counter medications for remaining symptom management, urgent care follow-up as needed Final Clinical  Impressions(s) / UC Diagnoses   Final diagnoses:  None   Discharge Instructions   None    ED Prescriptions   None    PDMP not reviewed this encounter.   Valinda Hoar, NP 01/08/22 1433

## 2022-01-08 NOTE — ED Triage Notes (Signed)
Pt c/o ear ache in the right ear x3days and Dry Mouth. Pt tested positive for covid on Tuesday.

## 2022-01-08 NOTE — Discharge Instructions (Signed)
Symptoms today are most likely related to your congestion, the ear nose and throat sinus cavities are all connected and this common with nasal congestion that it moves behind your eardrum and will cause pain, decreased hearing, sensation of clogged ears and ear popping  You can begin use of Mucinex (guaifenesin, Robitussin) to help thin secretions  You can begin use of antihistamine such as Claritin or Zyrtec to help reduce secretions  You can begin use of a nasal spray to help clear sinus passages ways such as Flonase  Ensure that you are getting adequate fluid intake, it is recommended that you drink at least 8 glasses of water today, staying hydrated will help keep your secretions thin  You may sit in the steamed bathroom in addition to help moisten upper airways and further thin out secretions  For your mouth, you may gargle and spit lidocaine solution every 4 hours to give you temporary relief from the irritation, attempted use of throat lozenges, Listerine gargles and staying hydrated to help further aid symptoms  You may follow-up with urgent care as needed

## 2022-01-13 ENCOUNTER — Ambulatory Visit: Admit: 2022-01-13 | Payer: MEDICARE

## 2022-01-24 ENCOUNTER — Telehealth: Admit: 2022-01-24 | Discharge: 2022-01-25 | Payer: MEDICARE | Attending: Family Medicine | Primary: Family Medicine

## 2022-01-24 DIAGNOSIS — R519 Acute nonintractable headache, unspecified headache type: Principal | ICD-10-CM

## 2022-01-24 DIAGNOSIS — J3489 Other specified disorders of nose and nasal sinuses: Principal | ICD-10-CM

## 2022-01-28 ENCOUNTER — Ambulatory Visit: Admit: 2022-01-28 | Discharge: 2022-01-29 | Payer: MEDICARE

## 2022-02-04 ENCOUNTER — Ambulatory Visit
Admit: 2022-02-04 | Discharge: 2022-02-05 | Payer: MEDICARE | Attending: Student in an Organized Health Care Education/Training Program | Primary: Student in an Organized Health Care Education/Training Program

## 2022-02-07 DIAGNOSIS — F331 Major depressive disorder, recurrent, moderate: Principal | ICD-10-CM

## 2022-02-07 DIAGNOSIS — F3175 Bipolar disorder, in partial remission, most recent episode depressed: Principal | ICD-10-CM

## 2022-02-07 DIAGNOSIS — F17200 Nicotine dependence, unspecified, uncomplicated: Principal | ICD-10-CM

## 2022-02-07 MED ORDER — HYDROXYZINE PAMOATE 50 MG CAPSULE
ORAL_CAPSULE | Freq: Two times a day (BID) | ORAL | 2 refills | 30 days | Status: CP | PRN
Start: 2022-02-07 — End: 2022-05-08

## 2022-02-07 MED ORDER — QUETIAPINE 300 MG TABLET
ORAL_TABLET | Freq: Every evening | ORAL | 0 refills | 90 days | Status: CP
Start: 2022-02-07 — End: 2022-05-08

## 2022-02-07 MED ORDER — BUPROPION HCL XL 150 MG 24 HR TABLET, EXTENDED RELEASE
ORAL_TABLET | Freq: Every morning | ORAL | 0 refills | 90 days | Status: CP
Start: 2022-02-07 — End: 2022-05-08

## 2022-02-07 MED ORDER — LAMOTRIGINE 150 MG TABLET
ORAL_TABLET | Freq: Every day | ORAL | 0 refills | 90 days | Status: CP
Start: 2022-02-07 — End: 2022-05-08

## 2022-02-07 MED ORDER — SERTRALINE 50 MG TABLET
ORAL_TABLET | Freq: Every day | ORAL | 0 refills | 90 days | Status: CP
Start: 2022-02-07 — End: 2022-05-08

## 2022-02-08 ENCOUNTER — Ambulatory Visit: Admit: 2022-02-08 | Discharge: 2022-02-09 | Payer: MEDICARE

## 2022-02-22 ENCOUNTER — Telehealth: Admit: 2022-02-22 | Discharge: 2022-02-23 | Payer: MEDICARE

## 2022-02-22 DIAGNOSIS — Z01818 Encounter for other preprocedural examination: Principal | ICD-10-CM

## 2022-02-24 ENCOUNTER — Encounter: Admit: 2022-02-24 | Discharge: 2022-02-25 | Payer: MEDICARE

## 2022-02-24 ENCOUNTER — Ambulatory Visit: Admit: 2022-02-24 | Discharge: 2022-02-25 | Payer: MEDICARE

## 2022-02-24 MED ORDER — OXYCODONE 5 MG TABLET
ORAL_TABLET | ORAL | 0 refills | 2.00000 days | Status: CP | PRN
Start: 2022-02-24 — End: 2022-03-01

## 2022-03-09 DIAGNOSIS — N186 End stage renal disease: Principal | ICD-10-CM

## 2022-03-09 DIAGNOSIS — Z992 Dependence on renal dialysis: Principal | ICD-10-CM

## 2022-03-09 DIAGNOSIS — Z794 Long term (current) use of insulin: Principal | ICD-10-CM

## 2022-03-09 DIAGNOSIS — E1122 Type 2 diabetes mellitus with diabetic chronic kidney disease: Principal | ICD-10-CM

## 2022-03-09 MED ORDER — BD NANO 2ND GEN PEN NEEDLE 32 GAUGE X 5/32" (4 MM)
0 refills | 0 days | Status: CP
Start: 2022-03-09 — End: ?

## 2022-03-14 DIAGNOSIS — N186 End stage renal disease: Principal | ICD-10-CM

## 2022-03-14 MED ORDER — BUMETANIDE 2 MG TABLET
ORAL_TABLET | 0 refills | 0 days
Start: 2022-03-14 — End: ?

## 2022-03-15 MED ORDER — BUMETANIDE 2 MG TABLET
ORAL_TABLET | 0 refills | 0 days | Status: CP
Start: 2022-03-15 — End: ?

## 2022-03-17 ENCOUNTER — Ambulatory Visit
Admit: 2022-03-17 | Discharge: 2022-03-18 | Payer: MEDICARE | Attending: Student in an Organized Health Care Education/Training Program | Primary: Student in an Organized Health Care Education/Training Program

## 2022-03-17 ENCOUNTER — Ambulatory Visit: Admit: 2022-03-17 | Discharge: 2022-03-18 | Payer: MEDICARE

## 2022-03-17 DIAGNOSIS — N186 End stage renal disease: Principal | ICD-10-CM

## 2022-03-17 MED ORDER — TIZANIDINE 2 MG TABLET
ORAL_TABLET | Freq: Every day | ORAL | 0 refills | 0 days | Status: CP | PRN
Start: 2022-03-17 — End: 2022-06-15

## 2022-03-17 MED ORDER — OZEMPIC 0.25 MG OR 0.5 MG (2 MG/1.5 ML) SUBCUTANEOUS PEN INJECTOR
SUBCUTANEOUS | 0 refills | 28 days | Status: CP
Start: 2022-03-17 — End: ?

## 2022-03-17 MED ORDER — BUMETANIDE 2 MG TABLET
ORAL_TABLET | 0 refills | 0 days
Start: 2022-03-17 — End: ?

## 2022-03-17 MED ORDER — LANTUS SOLOSTAR U-100 INSULIN 100 UNIT/ML (3 ML) SUBCUTANEOUS PEN
Freq: Every evening | SUBCUTANEOUS | 0 refills | 75 days | Status: CP
Start: 2022-03-17 — End: ?

## 2022-03-17 MED ORDER — METOCLOPRAMIDE 10 MG TABLET
ORAL_TABLET | Freq: Every day | ORAL | 0 refills | 90 days | Status: CP | PRN
Start: 2022-03-17 — End: ?

## 2022-03-18 ENCOUNTER — Ambulatory Visit
Admit: 2022-03-18 | Discharge: 2022-03-19 | Payer: MEDICARE | Attending: Student in an Organized Health Care Education/Training Program | Primary: Student in an Organized Health Care Education/Training Program

## 2022-03-25 ENCOUNTER — Ambulatory Visit
Admit: 2022-03-25 | Payer: MEDICARE | Attending: Student in an Organized Health Care Education/Training Program | Primary: Student in an Organized Health Care Education/Training Program

## 2022-04-07 ENCOUNTER — Telehealth
Admit: 2022-04-07 | Discharge: 2022-04-08 | Payer: MEDICARE | Attending: Student in an Organized Health Care Education/Training Program | Primary: Student in an Organized Health Care Education/Training Program

## 2022-04-07 DIAGNOSIS — N186 End stage renal disease: Principal | ICD-10-CM

## 2022-04-07 DIAGNOSIS — R5383 Other fatigue: Principal | ICD-10-CM

## 2022-04-07 DIAGNOSIS — G2581 Restless legs syndrome: Principal | ICD-10-CM

## 2022-04-07 DIAGNOSIS — Z992 Dependence on renal dialysis: Principal | ICD-10-CM

## 2022-04-07 DIAGNOSIS — Z794 Long term (current) use of insulin: Principal | ICD-10-CM

## 2022-04-07 DIAGNOSIS — E1122 Type 2 diabetes mellitus with diabetic chronic kidney disease: Principal | ICD-10-CM

## 2022-04-07 MED ORDER — OZEMPIC 1 MG/DOSE (4 MG/3 ML) SUBCUTANEOUS PEN INJECTOR
SUBCUTANEOUS | 0 refills | 84 days | Status: CP
Start: 2022-04-07 — End: 2022-07-06

## 2022-04-07 MED ORDER — TIZANIDINE 2 MG TABLET
ORAL_TABLET | Freq: Every day | ORAL | 0 refills | 0 days | Status: CP | PRN
Start: 2022-04-07 — End: 2022-07-06

## 2022-04-11 DIAGNOSIS — N186 End stage renal disease: Principal | ICD-10-CM

## 2022-04-11 DIAGNOSIS — Z992 Dependence on renal dialysis: Principal | ICD-10-CM

## 2022-04-11 DIAGNOSIS — Z794 Long term (current) use of insulin: Principal | ICD-10-CM

## 2022-04-11 DIAGNOSIS — E1122 Type 2 diabetes mellitus with diabetic chronic kidney disease: Principal | ICD-10-CM

## 2022-04-13 MED ORDER — METOPROLOL SUCCINATE ER 50 MG TABLET,EXTENDED RELEASE 24 HR
ORAL_TABLET | 3 refills | 0 days | Status: CP
Start: 2022-04-13 — End: ?

## 2022-04-23 DIAGNOSIS — E1122 Type 2 diabetes mellitus with diabetic chronic kidney disease: Principal | ICD-10-CM

## 2022-04-23 DIAGNOSIS — Z794 Long term (current) use of insulin: Principal | ICD-10-CM

## 2022-04-23 DIAGNOSIS — Z992 Dependence on renal dialysis: Principal | ICD-10-CM

## 2022-04-23 DIAGNOSIS — N186 End stage renal disease: Principal | ICD-10-CM

## 2022-04-23 MED ORDER — LANTUS SOLOSTAR U-100 INSULIN 100 UNIT/ML (3 ML) SUBCUTANEOUS PEN
0 refills | 0 days
Start: 2022-04-23 — End: ?

## 2022-04-25 MED ORDER — LANTUS SOLOSTAR U-100 INSULIN 100 UNIT/ML (3 ML) SUBCUTANEOUS PEN
0 refills | 0 days
Start: 2022-04-25 — End: ?

## 2022-05-04 DIAGNOSIS — G2581 Restless legs syndrome: Principal | ICD-10-CM

## 2022-05-04 MED ORDER — TIZANIDINE 2 MG TABLET
ORAL_TABLET | Freq: Every day | ORAL | 0 refills | 0 days | Status: CP | PRN
Start: 2022-05-04 — End: 2022-08-02

## 2022-05-05 MED ORDER — SERTRALINE 50 MG TABLET
ORAL_TABLET | Freq: Every day | ORAL | 0 refills | 30 days | Status: CP
Start: 2022-05-05 — End: 2022-06-04

## 2022-05-05 MED ORDER — LAMOTRIGINE 150 MG TABLET
ORAL_TABLET | Freq: Every day | ORAL | 0 refills | 30 days | Status: CP
Start: 2022-05-05 — End: 2022-06-04

## 2022-05-08 DIAGNOSIS — E039 Hypothyroidism, unspecified: Principal | ICD-10-CM

## 2022-05-08 MED ORDER — LEVOTHYROXINE 25 MCG TABLET
ORAL_TABLET | 1 refills | 0 days
Start: 2022-05-08 — End: ?

## 2022-05-09 MED ORDER — LEVOTHYROXINE 25 MCG TABLET
ORAL_TABLET | 1 refills | 0 days | Status: CP
Start: 2022-05-09 — End: ?

## 2022-05-10 ENCOUNTER — Ambulatory Visit: Admit: 2022-05-10 | Discharge: 2022-05-11 | Payer: MEDICARE

## 2022-05-10 DIAGNOSIS — G8929 Other chronic pain: Principal | ICD-10-CM

## 2022-05-10 DIAGNOSIS — K5909 Other constipation: Principal | ICD-10-CM

## 2022-05-10 DIAGNOSIS — N186 End stage renal disease: Principal | ICD-10-CM

## 2022-05-10 DIAGNOSIS — R5383 Other fatigue: Principal | ICD-10-CM

## 2022-05-10 DIAGNOSIS — G629 Polyneuropathy, unspecified: Principal | ICD-10-CM

## 2022-05-10 DIAGNOSIS — Z992 Dependence on renal dialysis: Principal | ICD-10-CM

## 2022-05-10 DIAGNOSIS — R252 Cramp and spasm: Principal | ICD-10-CM

## 2022-05-10 DIAGNOSIS — M545 Chronic bilateral low back pain without sciatica: Principal | ICD-10-CM

## 2022-05-10 DIAGNOSIS — Z794 Long term (current) use of insulin: Principal | ICD-10-CM

## 2022-05-10 DIAGNOSIS — R5381 Other malaise: Principal | ICD-10-CM

## 2022-05-10 DIAGNOSIS — Z515 Encounter for palliative care: Principal | ICD-10-CM

## 2022-05-10 DIAGNOSIS — E1122 Type 2 diabetes mellitus with diabetic chronic kidney disease: Principal | ICD-10-CM

## 2022-05-10 MED ORDER — PREGABALIN 200 MG CAPSULE
ORAL_CAPSULE | Freq: Every evening | ORAL | 3 refills | 90 days | Status: CP
Start: 2022-05-10 — End: 2023-05-10

## 2022-05-11 DIAGNOSIS — E1122 Type 2 diabetes mellitus with diabetic chronic kidney disease: Principal | ICD-10-CM

## 2022-05-11 DIAGNOSIS — N189 Chronic kidney disease, unspecified: Principal | ICD-10-CM

## 2022-05-11 DIAGNOSIS — Z992 Dependence on renal dialysis: Principal | ICD-10-CM

## 2022-05-11 DIAGNOSIS — D631 Anemia in chronic kidney disease: Principal | ICD-10-CM

## 2022-05-11 DIAGNOSIS — M79604 Pain in right leg: Principal | ICD-10-CM

## 2022-05-11 DIAGNOSIS — M79605 Pain in left leg: Principal | ICD-10-CM

## 2022-05-11 DIAGNOSIS — N186 End stage renal disease: Principal | ICD-10-CM

## 2022-05-11 DIAGNOSIS — Z794 Long term (current) use of insulin: Principal | ICD-10-CM

## 2022-05-19 ENCOUNTER — Ambulatory Visit: Admit: 2022-05-19 | Discharge: 2022-05-20 | Payer: MEDICARE

## 2022-05-19 DIAGNOSIS — L97512 Non-pressure chronic ulcer of other part of right foot with fat layer exposed: Principal | ICD-10-CM

## 2022-05-19 MED ORDER — HYDROGEL
Freq: Every day | 0 refills | 0 days | Status: CP
Start: 2022-05-19 — End: 2022-05-19

## 2022-05-19 MED ORDER — GEL DRESSING 4" X 4"
Freq: Every day | TOPICAL | 1 refills | 0 days | Status: CP
Start: 2022-05-19 — End: 2022-07-18

## 2022-05-26 ENCOUNTER — Telehealth
Admit: 2022-05-26 | Discharge: 2022-05-27 | Payer: MEDICARE | Attending: Psychiatric/Mental Health | Primary: Psychiatric/Mental Health

## 2022-05-26 DIAGNOSIS — F3175 Bipolar disorder, in partial remission, most recent episode depressed: Principal | ICD-10-CM

## 2022-05-26 DIAGNOSIS — F331 Major depressive disorder, recurrent, moderate: Principal | ICD-10-CM

## 2022-05-28 ENCOUNTER — Ambulatory Visit
Admit: 2022-05-28 | Discharge: 2022-05-28 | Disposition: A | Discharge status: Home / Self Care | Payer: MEDICARE | Attending: Emergency Medicine

## 2022-05-28 ENCOUNTER — Emergency Department
Admit: 2022-05-28 | Discharge: 2022-05-28 | Disposition: A | Discharge status: Home / Self Care | Payer: MEDICARE | Attending: Emergency Medicine

## 2022-05-28 DIAGNOSIS — E875 Hyperkalemia: Principal | ICD-10-CM

## 2022-05-28 MED ORDER — AMOXICILLIN 875 MG-POTASSIUM CLAVULANATE 125 MG TABLET
ORAL_TABLET | Freq: Two times a day (BID) | ORAL | 0 refills | 10 days | Status: CP
Start: 2022-05-28 — End: 2022-06-07

## 2022-05-28 MED ORDER — BENZONATATE 100 MG CAPSULE
ORAL_CAPSULE | Freq: Three times a day (TID) | ORAL | 0 refills | 7 days | Status: CP
Start: 2022-05-28 — End: 2022-06-04

## 2022-05-28 MED ORDER — CODEINE 10 MG-GUAIFENESIN 100 MG/5 ML ORAL LIQUID
Freq: Three times a day (TID) | ORAL | 0 refills | 8 days | Status: CP | PRN
Start: 2022-05-28 — End: 2022-06-02

## 2022-05-30 DIAGNOSIS — L97509 Non-pressure chronic ulcer of other part of unspecified foot with unspecified severity: Principal | ICD-10-CM

## 2022-05-31 DIAGNOSIS — R059 Cough, unspecified type: Principal | ICD-10-CM

## 2022-05-31 MED ORDER — AEROCHAMBER MV SPACER
Freq: Four times a day (QID) | 0 refills | 1 days | Status: CP | PRN
Start: 2022-05-31 — End: ?

## 2022-05-31 MED ORDER — ALBUTEROL SULFATE HFA 90 MCG/ACTUATION AEROSOL INHALER
Freq: Four times a day (QID) | RESPIRATORY_TRACT | 0 refills | 0 days | Status: CP | PRN
Start: 2022-05-31 — End: 2023-05-31

## 2022-06-02 ENCOUNTER — Ambulatory Visit: Admit: 2022-06-02 | Payer: MEDICARE

## 2022-06-02 ENCOUNTER — Encounter: Admit: 2022-06-02 | Payer: MEDICARE

## 2022-06-02 ENCOUNTER — Ambulatory Visit
Admit: 2022-06-02 | Discharge: 2022-06-16 | Disposition: A | Discharge status: Skilled Nursing Facility | Payer: MEDICARE

## 2022-06-02 ENCOUNTER — Ambulatory Visit: Admit: 2022-06-02 | Discharge: 2022-06-16 | Payer: MEDICARE

## 2022-06-02 ENCOUNTER — Ambulatory Visit: Admit: 2022-06-02 | Discharge: 2022-06-03 | Payer: MEDICARE | Attending: Podiatrist | Primary: Podiatrist

## 2022-06-02 DIAGNOSIS — L97509 Non-pressure chronic ulcer of other part of unspecified foot with unspecified severity: Principal | ICD-10-CM

## 2022-06-17 DIAGNOSIS — M86171 Other acute osteomyelitis, right ankle and foot: Principal | ICD-10-CM

## 2022-06-20 DIAGNOSIS — L089 Local infection of the skin and subcutaneous tissue, unspecified: Principal | ICD-10-CM

## 2022-06-20 DIAGNOSIS — E11628 Type 2 diabetes mellitus with other skin complications: Principal | ICD-10-CM

## 2022-06-20 DIAGNOSIS — M86171 Other acute osteomyelitis, right ankle and foot: Principal | ICD-10-CM

## 2022-06-20 MED ORDER — METRONIDAZOLE 500 MG TABLET
ORAL_TABLET | Freq: Three times a day (TID) | ORAL | 0 refills | 4 days | Status: CP
Start: 2022-06-20 — End: 2022-06-24

## 2022-06-24 ENCOUNTER — Ambulatory Visit: Admit: 2022-06-24 | Discharge: 2022-06-25 | Payer: MEDICARE | Attending: Family Medicine | Primary: Family Medicine

## 2022-06-24 DIAGNOSIS — L97414 Non-pressure chronic ulcer of right heel and midfoot with necrosis of bone: Principal | ICD-10-CM

## 2022-06-24 MED ORDER — AMLODIPINE 10 MG TABLET
ORAL_TABLET | Freq: Every day | ORAL | 3 refills | 90 days | Status: CP
Start: 2022-06-24 — End: 2023-06-24

## 2022-06-25 MED ORDER — SERTRALINE 50 MG TABLET
ORAL_TABLET | 0 refills | 0 days
Start: 2022-06-25 — End: ?

## 2022-06-26 MED ORDER — LAMOTRIGINE 150 MG TABLET
ORAL_TABLET | 0 refills | 0 days
Start: 2022-06-26 — End: ?

## 2022-06-27 MED ORDER — LAMOTRIGINE 150 MG TABLET
ORAL_TABLET | Freq: Every day | ORAL | 0 refills | 90 days | Status: CP
Start: 2022-06-27 — End: 2022-09-25

## 2022-06-27 MED ORDER — SERTRALINE 50 MG TABLET
ORAL_TABLET | Freq: Every day | ORAL | 0 refills | 90 days | Status: CP
Start: 2022-06-27 — End: 2022-09-25

## 2022-06-28 ENCOUNTER — Ambulatory Visit: Admit: 2022-06-28 | Discharge: 2022-06-29 | Payer: MEDICARE

## 2022-06-28 ENCOUNTER — Institutional Professional Consult (permissible substitution): Admit: 2022-06-28 | Discharge: 2022-06-28 | Payer: MEDICARE

## 2022-06-28 MED ORDER — BLOOD-GLUCOSE METER KIT WRAPPER
11 refills | 0 days | Status: CP
Start: 2022-06-28 — End: 2023-06-28

## 2022-06-30 ENCOUNTER — Encounter: Admit: 2022-06-30 | Discharge: 2022-07-29 | Payer: MEDICARE

## 2022-06-30 ENCOUNTER — Encounter: Admit: 2022-06-30 | Payer: MEDICARE

## 2022-06-30 ENCOUNTER — Ambulatory Visit: Admit: 2022-06-30 | Payer: MEDICARE

## 2022-06-30 ENCOUNTER — Inpatient Hospital Stay: Admit: 2022-06-30 | Payer: MEDICARE

## 2022-07-01 ENCOUNTER — Ambulatory Visit: Admit: 2022-07-01 | Payer: MEDICARE

## 2022-07-01 ENCOUNTER — Ambulatory Visit: Admit: 2022-07-01 | Discharge: 2022-07-16 | Disposition: A | Discharge status: Home Health | Payer: MEDICARE

## 2022-07-01 ENCOUNTER — Ambulatory Visit: Admit: 2022-07-01 | Discharge: 2022-07-16 | Payer: MEDICARE

## 2022-07-01 ENCOUNTER — Encounter: Admit: 2022-07-01 | Payer: MEDICARE | Attending: Student in an Organized Health Care Education/Training Program

## 2022-07-02 DIAGNOSIS — L089 Local infection of the skin and subcutaneous tissue, unspecified: Principal | ICD-10-CM

## 2022-07-02 DIAGNOSIS — N189 Chronic kidney disease, unspecified: Principal | ICD-10-CM

## 2022-07-02 DIAGNOSIS — E11628 Type 2 diabetes mellitus with other skin complications: Principal | ICD-10-CM

## 2022-07-02 DIAGNOSIS — D631 Anemia in chronic kidney disease: Principal | ICD-10-CM

## 2022-07-02 IMAGING — CR DG CHEST 2V
2 series · 2 of 2 positions shown · non-contrast
Comparison: None.

CLINICAL DATA: 54-year-old female with cough.

EXAM:
CHEST - 2 VIEW

[chest pa]
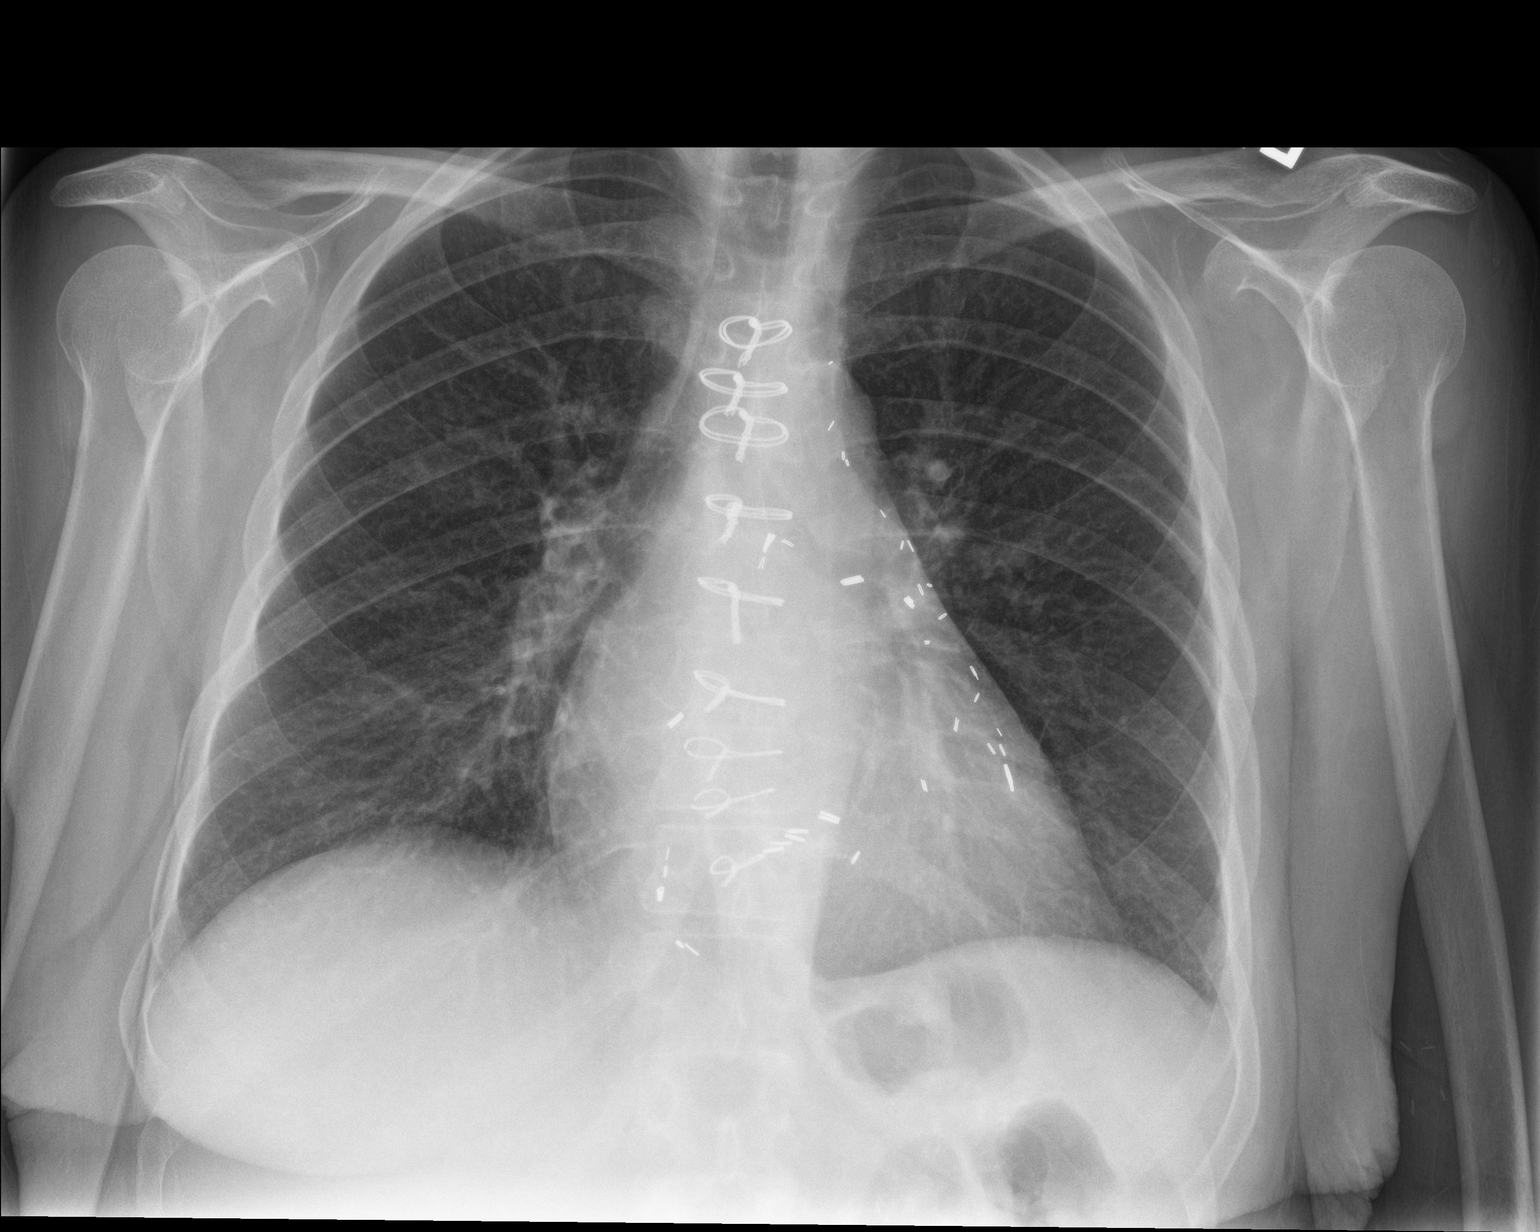

[chest lat]
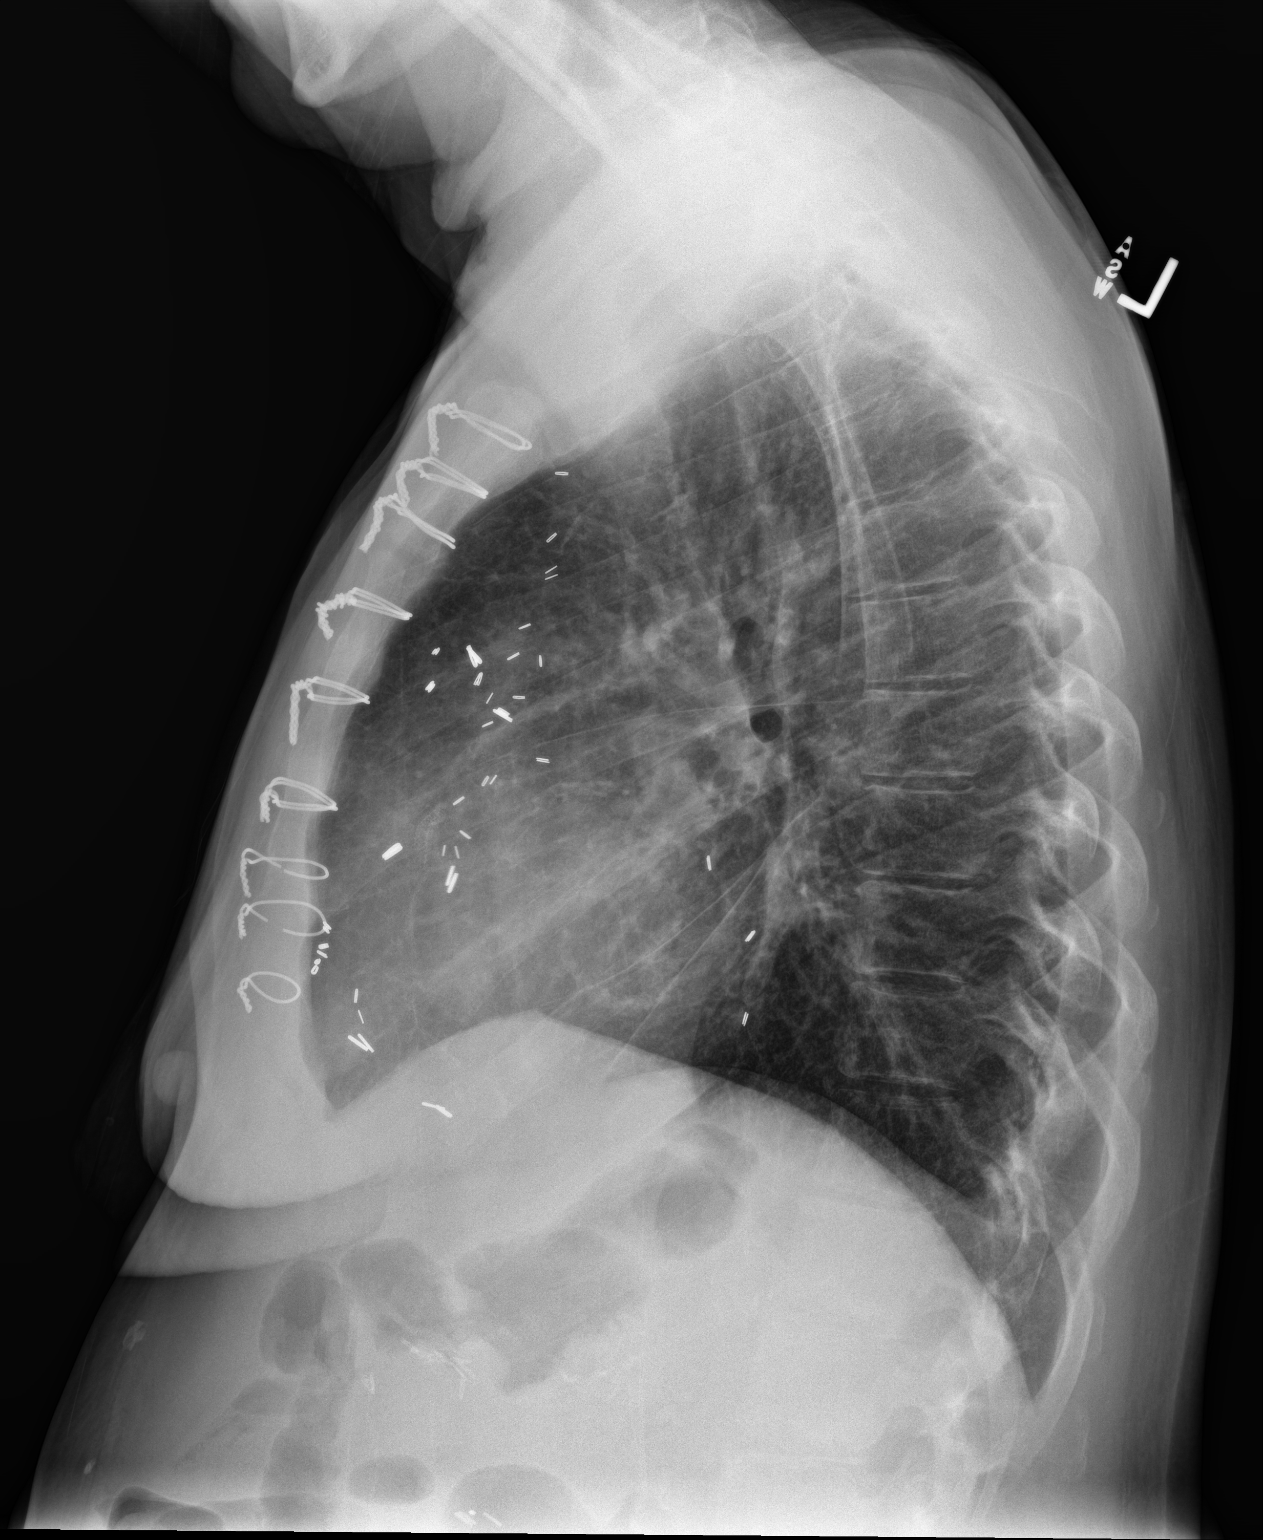

[2 of 2 positions shown; findings below may reference images not displayed]

FINDINGS: The lungs are clear. There is no pleural effusion pneumothorax. The
cardiac silhouette is within limits. Median sternotomy wires and
CABG vascular clips. No acute osseous pathology.
IMPRESSION: No active cardiopulmonary disease.

## 2022-07-16 MED ORDER — POLYETHYLENE GLYCOL 3350 17 GRAM ORAL POWDER PACKET
PACK | Freq: Every day | ORAL | 0 refills | 90 days | Status: CP
Start: 2022-07-16 — End: 2022-10-14

## 2022-07-16 MED ORDER — CALCIUM ACETATE(PHOSPHATE BINDERS) 667 MG CAPSULE
ORAL_CAPSULE | Freq: Three times a day (TID) | ORAL | 11 refills | 30 days | Status: CP
Start: 2022-07-16 — End: 2023-07-16

## 2022-07-17 MED ORDER — METOPROLOL SUCCINATE ER 25 MG TABLET,EXTENDED RELEASE 24 HR
ORAL_TABLET | Freq: Every day | ORAL | 0 refills | 30 days | Status: CP
Start: 2022-07-17 — End: 2022-08-16

## 2022-07-18 MED ORDER — GABAPENTIN 100 MG CAPSULE
ORAL_CAPSULE | ORAL | 0 refills | 84 days | Status: CP
Start: 2022-07-18 — End: 2022-10-10

## 2022-07-19 DIAGNOSIS — G2581 Restless legs syndrome: Principal | ICD-10-CM

## 2022-07-19 MED ORDER — GABAPENTIN 100 MG CAPSULE
ORAL_CAPSULE | Freq: Every evening | ORAL | 0 refills | 90 days | Status: CP
Start: 2022-07-19 — End: 2022-10-17

## 2022-07-21 ENCOUNTER — Institutional Professional Consult (permissible substitution): Admit: 2022-07-21 | Discharge: 2022-07-22

## 2022-07-25 DIAGNOSIS — I10 Essential (primary) hypertension: Principal | ICD-10-CM

## 2022-07-25 DIAGNOSIS — N186 End stage renal disease: Principal | ICD-10-CM

## 2022-07-25 MED ORDER — VALSARTAN 40 MG TABLET
ORAL_TABLET | Freq: Every evening | ORAL | 3 refills | 90 days | Status: CP
Start: 2022-07-25 — End: 2023-07-25

## 2022-07-28 ENCOUNTER — Institutional Professional Consult (permissible substitution): Admit: 2022-07-28 | Discharge: 2022-07-29

## 2022-07-30 ENCOUNTER — Encounter: Admit: 2022-07-30 | Payer: MEDICARE

## 2022-07-30 ENCOUNTER — Encounter: Admit: 2022-07-30 | Discharge: 2022-08-28 | Payer: MEDICARE

## 2022-08-04 ENCOUNTER — Institutional Professional Consult (permissible substitution): Admit: 2022-08-04 | Discharge: 2022-08-05 | Payer: MEDICARE

## 2022-08-09 ENCOUNTER — Ambulatory Visit: Admit: 2022-08-09 | Discharge: 2022-08-10 | Payer: MEDICARE

## 2022-08-09 ENCOUNTER — Ambulatory Visit: Admit: 2022-08-09 | Payer: MEDICARE | Attending: Psychiatric/Mental Health | Primary: Psychiatric/Mental Health

## 2022-08-09 DIAGNOSIS — Z01818 Encounter for other preprocedural examination: Principal | ICD-10-CM

## 2022-08-09 DIAGNOSIS — N186 End stage renal disease: Principal | ICD-10-CM

## 2022-08-09 DIAGNOSIS — I2510 Atherosclerotic heart disease of native coronary artery without angina pectoris: Principal | ICD-10-CM

## 2022-08-09 DIAGNOSIS — I10 Essential (primary) hypertension: Principal | ICD-10-CM

## 2022-08-09 DIAGNOSIS — L97412 Non-pressure chronic ulcer of right heel and midfoot with fat layer exposed: Principal | ICD-10-CM

## 2022-08-09 MED ORDER — BUMETANIDE 2 MG TABLET
Freq: Two times a day (BID) | ORAL | 0 refills | 0 days
Start: 2022-08-09 — End: ?

## 2022-08-11 ENCOUNTER — Encounter: Admit: 2022-08-11 | Discharge: 2022-08-11 | Payer: MEDICARE

## 2022-08-11 ENCOUNTER — Ambulatory Visit: Admit: 2022-08-11 | Discharge: 2022-08-11 | Payer: MEDICARE

## 2022-08-11 MED ORDER — TIZANIDINE 2 MG TABLET
ORAL_TABLET | Freq: Every day | ORAL | 0 refills | 0.00000 days | Status: CP | PRN
Start: 2022-08-11 — End: 2022-11-09

## 2022-08-11 MED ORDER — GEL DRESSING 4" X 4"
Freq: Every day | TOPICAL | 1 refills | 0.00000 days | Status: CP
Start: 2022-08-11 — End: 2022-10-10

## 2022-08-11 MED ORDER — OXYCODONE 5 MG TABLET
ORAL_TABLET | Freq: Four times a day (QID) | ORAL | 0 refills | 3.00000 days | Status: CP | PRN
Start: 2022-08-11 — End: 2022-08-11

## 2022-08-15 MED ORDER — BUMETANIDE 2 MG TABLET
ORAL_TABLET | Freq: Two times a day (BID) | ORAL | 3 refills | 25 days | Status: CP
Start: 2022-08-15 — End: ?

## 2022-08-19 DIAGNOSIS — J069 Acute upper respiratory infection, unspecified: Principal | ICD-10-CM

## 2022-08-19 DIAGNOSIS — N185 Chronic kidney disease, stage 5: Principal | ICD-10-CM

## 2022-08-19 DIAGNOSIS — Z01818 Encounter for other preprocedural examination: Principal | ICD-10-CM

## 2022-08-20 DIAGNOSIS — F3175 Bipolar disorder, in partial remission, most recent episode depressed: Principal | ICD-10-CM

## 2022-08-20 DIAGNOSIS — F331 Major depressive disorder, recurrent, moderate: Principal | ICD-10-CM

## 2022-08-20 MED ORDER — QUETIAPINE 300 MG TABLET
ORAL_TABLET | Freq: Every evening | ORAL | 0 refills | 90 days
Start: 2022-08-20 — End: ?

## 2022-08-23 ENCOUNTER — Institutional Professional Consult (permissible substitution): Admit: 2022-08-23 | Discharge: 2022-08-24 | Payer: MEDICARE

## 2022-08-23 MED ORDER — QUETIAPINE 300 MG TABLET
ORAL_TABLET | Freq: Every evening | ORAL | 0 refills | 90 days | Status: CP
Start: 2022-08-23 — End: 2022-11-21

## 2022-08-29 ENCOUNTER — Encounter: Admit: 2022-08-29 | Discharge: 2022-09-27 | Payer: MEDICARE

## 2022-08-29 ENCOUNTER — Encounter: Admit: 2022-08-29 | Payer: MEDICARE

## 2022-08-29 ENCOUNTER — Encounter: Admit: 2022-08-29 | Discharge: 2022-08-28 | Payer: MEDICARE

## 2022-08-30 ENCOUNTER — Institutional Professional Consult (permissible substitution): Admit: 2022-08-30 | Discharge: 2022-08-31

## 2022-09-03 DIAGNOSIS — L97509 Non-pressure chronic ulcer of other part of unspecified foot with unspecified severity: Principal | ICD-10-CM

## 2022-09-03 DIAGNOSIS — Z794 Long term (current) use of insulin: Principal | ICD-10-CM

## 2022-09-03 DIAGNOSIS — E11621 Type 2 diabetes mellitus with foot ulcer: Principal | ICD-10-CM

## 2022-09-03 DIAGNOSIS — Z9884 Bariatric surgery status: Principal | ICD-10-CM

## 2022-09-03 MED ORDER — BLOOD-GLUCOSE METER KIT WRAPPER
11 refills | 0 days
Start: 2022-09-03 — End: 2023-09-03

## 2022-09-03 MED ORDER — METOCLOPRAMIDE 10 MG TABLET
ORAL_TABLET | Freq: Every day | ORAL | 0 refills | 90 days | PRN
Start: 2022-09-03 — End: ?

## 2022-09-03 MED ORDER — ONDANSETRON HCL 4 MG TABLET
Freq: Three times a day (TID) | ORAL | 0 refills | 0 days | PRN
Start: 2022-09-03 — End: ?

## 2022-09-03 MED ORDER — ACETAMINOPHEN 500 MG TABLET
ORAL_TABLET | Freq: Three times a day (TID) | ORAL | 0 refills | 5 days | PRN
Start: 2022-09-03 — End: ?

## 2022-09-05 MED ORDER — BLOOD-GLUCOSE METER KIT WRAPPER
11 refills | 0 days | Status: CP
Start: 2022-09-05 — End: 2023-09-03

## 2022-09-06 ENCOUNTER — Telehealth
Admit: 2022-09-06 | Discharge: 2022-09-07 | Payer: MEDICARE | Attending: Psychiatric/Mental Health | Primary: Psychiatric/Mental Health

## 2022-09-06 ENCOUNTER — Institutional Professional Consult (permissible substitution): Admit: 2022-09-06 | Discharge: 2022-09-07 | Payer: MEDICARE

## 2022-09-06 DIAGNOSIS — G2581 Restless legs syndrome: Principal | ICD-10-CM

## 2022-09-06 DIAGNOSIS — Z9884 Bariatric surgery status: Principal | ICD-10-CM

## 2022-09-06 DIAGNOSIS — F3175 Bipolar disorder, in partial remission, most recent episode depressed: Principal | ICD-10-CM

## 2022-09-06 MED ORDER — TIZANIDINE 2 MG TABLET
ORAL_TABLET | Freq: Every day | ORAL | 0 refills | 0 days | PRN
Start: 2022-09-06 — End: 2022-12-05

## 2022-09-06 MED ORDER — BUMETANIDE 2 MG TABLET
ORAL_TABLET | Freq: Two times a day (BID) | ORAL | 3 refills | 25 days | Status: CP
Start: 2022-09-06 — End: ?

## 2022-09-06 MED ORDER — ACETAMINOPHEN 500 MG TABLET
ORAL_TABLET | Freq: Three times a day (TID) | ORAL | 0 refills | 5 days | Status: CP | PRN
Start: 2022-09-06 — End: ?

## 2022-09-06 MED ORDER — METOCLOPRAMIDE 10 MG TABLET
ORAL_TABLET | Freq: Every day | ORAL | 0 refills | 90.00000 days | Status: CP | PRN
Start: 2022-09-06 — End: ?

## 2022-09-06 MED ORDER — METOPROLOL SUCCINATE ER 25 MG TABLET,EXTENDED RELEASE 24 HR
ORAL_TABLET | Freq: Every day | ORAL | 0 refills | 30 days | Status: CP
Start: 2022-09-06 — End: 2022-10-06

## 2022-09-06 MED ORDER — ONDANSETRON HCL 4 MG TABLET
ORAL_TABLET | Freq: Three times a day (TID) | ORAL | 0 refills | 10 days | Status: CP | PRN
Start: 2022-09-06 — End: 2022-10-06

## 2022-09-06 MED ORDER — LAMOTRIGINE 150 MG TABLET
ORAL_TABLET | Freq: Every day | ORAL | 0 refills | 90 days | Status: CP
Start: 2022-09-06 — End: 2022-12-05

## 2022-09-06 MED ORDER — SERTRALINE 50 MG TABLET
ORAL_TABLET | Freq: Every day | ORAL | 0 refills | 90 days | Status: CP
Start: 2022-09-06 — End: 2022-12-05

## 2022-09-13 ENCOUNTER — Institutional Professional Consult (permissible substitution): Admit: 2022-09-13 | Discharge: 2022-09-13

## 2022-09-13 ENCOUNTER — Ambulatory Visit: Admit: 2022-09-13 | Discharge: 2022-09-13

## 2022-09-13 ENCOUNTER — Ambulatory Visit: Admit: 2022-09-13 | Discharge: 2022-09-13 | Discharge status: Against Medical Advice | Payer: MEDICARE

## 2022-09-14 DIAGNOSIS — Z992 Dependence on renal dialysis: Principal | ICD-10-CM

## 2022-09-14 DIAGNOSIS — E1122 Type 2 diabetes mellitus with diabetic chronic kidney disease: Principal | ICD-10-CM

## 2022-09-14 DIAGNOSIS — Z794 Long term (current) use of insulin: Principal | ICD-10-CM

## 2022-09-14 DIAGNOSIS — E785 Hyperlipidemia, unspecified: Principal | ICD-10-CM

## 2022-09-14 DIAGNOSIS — N186 End stage renal disease: Principal | ICD-10-CM

## 2022-09-14 MED ORDER — NOVOLOG FLEXPEN U-100 INSULIN ASPART 100 UNIT/ML (3 ML) SUBCUTANEOUS
0 refills | 0 days
Start: 2022-09-14 — End: ?

## 2022-09-14 MED ORDER — ATORVASTATIN 40 MG TABLET
ORAL_TABLET | 3 refills | 0 days
Start: 2022-09-14 — End: ?

## 2022-09-14 MED ORDER — HYDROXYZINE PAMOATE 50 MG CAPSULE
ORAL_CAPSULE | 2 refills | 0 days | Status: CP
Start: 2022-09-14 — End: ?

## 2022-09-15 ENCOUNTER — Ambulatory Visit: Admit: 2022-09-15 | Discharge: 2022-09-15 | Payer: MEDICARE

## 2022-09-15 DIAGNOSIS — N186 End stage renal disease: Principal | ICD-10-CM

## 2022-09-15 DIAGNOSIS — I2510 Atherosclerotic heart disease of native coronary artery without angina pectoris: Principal | ICD-10-CM

## 2022-09-15 DIAGNOSIS — Z01818 Encounter for other preprocedural examination: Principal | ICD-10-CM

## 2022-09-15 DIAGNOSIS — Z794 Long term (current) use of insulin: Principal | ICD-10-CM

## 2022-09-15 DIAGNOSIS — E1122 Type 2 diabetes mellitus with diabetic chronic kidney disease: Principal | ICD-10-CM

## 2022-09-15 DIAGNOSIS — I10 Essential (primary) hypertension: Principal | ICD-10-CM

## 2022-09-15 DIAGNOSIS — Z992 Dependence on renal dialysis: Principal | ICD-10-CM

## 2022-09-20 ENCOUNTER — Encounter: Admit: 2022-09-20 | Discharge: 2022-09-24 | Payer: MEDICARE

## 2022-09-20 ENCOUNTER — Ambulatory Visit: Admit: 2022-09-20 | Discharge: 2022-09-24 | Payer: MEDICARE

## 2022-09-20 MED ORDER — OXYCODONE 5 MG CAPSULE
ORAL_CAPSULE | Freq: Four times a day (QID) | ORAL | 0 refills | 5.00000 days | Status: CP | PRN
Start: 2022-09-20 — End: 2022-09-20

## 2022-09-20 MED ORDER — OXYCODONE 5 MG TABLET
ORAL_TABLET | Freq: Four times a day (QID) | ORAL | 0 refills | 5.00000 days | Status: CP | PRN
Start: 2022-09-20 — End: 2022-09-25
  Filled 2022-09-20: qty 20, 5d supply, fill #0

## 2022-09-22 MED ORDER — SERTRALINE 50 MG TABLET
ORAL_TABLET | 0 refills | 0 days
Start: 2022-09-22 — End: ?

## 2022-09-26 DIAGNOSIS — G2581 Restless legs syndrome: Principal | ICD-10-CM

## 2022-09-26 MED ORDER — TIZANIDINE 2 MG TABLET
ORAL_TABLET | 0 refills | 0 days
Start: 2022-09-26 — End: ?

## 2022-09-27 DIAGNOSIS — L97509 Non-pressure chronic ulcer of other part of unspecified foot with unspecified severity: Principal | ICD-10-CM

## 2022-09-27 DIAGNOSIS — E11621 Type 2 diabetes mellitus with foot ulcer: Principal | ICD-10-CM

## 2022-09-27 DIAGNOSIS — I10 Essential (primary) hypertension: Principal | ICD-10-CM

## 2022-09-27 DIAGNOSIS — Z794 Long term (current) use of insulin: Principal | ICD-10-CM

## 2022-09-27 MED ORDER — TIZANIDINE 2 MG TABLET
ORAL_TABLET | 0 refills | 0 days | Status: CP
Start: 2022-09-27 — End: ?

## 2022-09-28 ENCOUNTER — Encounter: Admit: 2022-09-28 | Payer: MEDICARE

## 2022-09-28 ENCOUNTER — Encounter: Admit: 2022-09-28 | Discharge: 2022-10-27 | Payer: MEDICARE

## 2022-09-29 ENCOUNTER — Institutional Professional Consult (permissible substitution): Admit: 2022-09-29 | Discharge: 2022-09-30

## 2022-09-30 DIAGNOSIS — E039 Hypothyroidism, unspecified: Principal | ICD-10-CM

## 2022-09-30 MED ORDER — LEVOTHYROXINE 25 MCG TABLET
ORAL_TABLET | 5 refills | 0 days | Status: CP
Start: 2022-09-30 — End: 2023-10-01

## 2022-10-04 ENCOUNTER — Institutional Professional Consult (permissible substitution): Admit: 2022-10-04 | Discharge: 2022-10-05

## 2022-10-10 ENCOUNTER — Ambulatory Visit: Admit: 2022-10-10 | Payer: MEDICARE

## 2022-10-11 ENCOUNTER — Institutional Professional Consult (permissible substitution): Admit: 2022-10-11 | Discharge: 2022-10-12

## 2022-10-16 DIAGNOSIS — Z794 Long term (current) use of insulin: Principal | ICD-10-CM

## 2022-10-16 DIAGNOSIS — L97509 Non-pressure chronic ulcer of other part of unspecified foot with unspecified severity: Principal | ICD-10-CM

## 2022-10-16 DIAGNOSIS — E11621 Type 2 diabetes mellitus with foot ulcer: Principal | ICD-10-CM

## 2022-10-18 ENCOUNTER — Institutional Professional Consult (permissible substitution): Admit: 2022-10-18 | Discharge: 2022-10-19

## 2022-10-18 DIAGNOSIS — Z794 Long term (current) use of insulin: Principal | ICD-10-CM

## 2022-10-18 DIAGNOSIS — L97509 Non-pressure chronic ulcer of other part of unspecified foot with unspecified severity: Principal | ICD-10-CM

## 2022-10-18 DIAGNOSIS — E11621 Type 2 diabetes mellitus with foot ulcer: Principal | ICD-10-CM

## 2022-10-24 DIAGNOSIS — I10 Essential (primary) hypertension: Principal | ICD-10-CM

## 2022-10-24 MED ORDER — VALSARTAN 80 MG TABLET
ORAL_TABLET | Freq: Every evening | ORAL | 3 refills | 90 days | Status: CP
Start: 2022-10-24 — End: 2023-10-24

## 2022-10-28 ENCOUNTER — Encounter: Admit: 2022-10-28 | Payer: MEDICARE

## 2022-10-28 ENCOUNTER — Encounter: Admit: 2022-10-28 | Discharge: 2022-11-26 | Payer: MEDICARE

## 2022-10-28 DIAGNOSIS — G2581 Restless legs syndrome: Principal | ICD-10-CM

## 2022-10-28 MED ORDER — GABAPENTIN 100 MG CAPSULE
ORAL_CAPSULE | 0 refills | 0 days | Status: CP
Start: 2022-10-28 — End: ?

## 2022-10-28 MED ORDER — OMEPRAZOLE 20 MG CAPSULE,DELAYED RELEASE
ORAL_CAPSULE | Freq: Every day | ORAL | 0 refills | 90 days | Status: CP
Start: 2022-10-28 — End: ?

## 2022-10-29 DIAGNOSIS — Z794 Long term (current) use of insulin: Principal | ICD-10-CM

## 2022-10-29 DIAGNOSIS — G2581 Restless legs syndrome: Principal | ICD-10-CM

## 2022-10-29 DIAGNOSIS — E11621 Type 2 diabetes mellitus with foot ulcer: Principal | ICD-10-CM

## 2022-10-29 DIAGNOSIS — L97509 Non-pressure chronic ulcer of other part of unspecified foot with unspecified severity: Principal | ICD-10-CM

## 2022-10-29 MED ORDER — TIZANIDINE 2 MG TABLET
ORAL_TABLET | 0 refills | 0 days
Start: 2022-10-29 — End: ?

## 2022-10-29 MED ORDER — BLOOD-GLUCOSE METER KIT WRAPPER
11 refills | 0 days
Start: 2022-10-29 — End: 2023-10-27

## 2022-10-31 MED ORDER — BLOOD-GLUCOSE METER KIT WRAPPER
11 refills | 0 days | Status: CP
Start: 2022-10-31 — End: 2023-10-27

## 2022-11-01 ENCOUNTER — Institutional Professional Consult (permissible substitution): Admit: 2022-11-01 | Discharge: 2022-11-02 | Payer: MEDICARE

## 2022-11-04 DIAGNOSIS — F3175 Bipolar disorder, in partial remission, most recent episode depressed: Principal | ICD-10-CM

## 2022-11-04 DIAGNOSIS — F331 Major depressive disorder, recurrent, moderate: Principal | ICD-10-CM

## 2022-11-04 DIAGNOSIS — G2581 Restless legs syndrome: Principal | ICD-10-CM

## 2022-11-04 MED ORDER — TIZANIDINE 2 MG TABLET
ORAL_TABLET | 0 refills | 0 days | Status: CP
Start: 2022-11-04 — End: ?

## 2022-11-04 MED ORDER — QUETIAPINE 300 MG TABLET
ORAL_TABLET | 0 refills | 0 days
Start: 2022-11-04 — End: ?

## 2022-11-09 DIAGNOSIS — E11621 Type 2 diabetes mellitus with foot ulcer: Principal | ICD-10-CM

## 2022-11-09 DIAGNOSIS — G2581 Restless legs syndrome: Principal | ICD-10-CM

## 2022-11-09 DIAGNOSIS — Z794 Long term (current) use of insulin: Principal | ICD-10-CM

## 2022-11-09 DIAGNOSIS — L97509 Non-pressure chronic ulcer of other part of unspecified foot with unspecified severity: Principal | ICD-10-CM

## 2022-11-09 MED ORDER — TIZANIDINE 2 MG TABLET
ORAL_TABLET | 0 refills | 0 days
Start: 2022-11-09 — End: ?

## 2022-11-14 MED ORDER — ROPINIROLE 0.25 MG TABLET
ORAL_TABLET | Freq: Every evening | ORAL | 11 refills | 30 days | Status: CP
Start: 2022-11-14 — End: 2023-11-14

## 2022-11-17 ENCOUNTER — Institutional Professional Consult (permissible substitution): Admit: 2022-11-17 | Discharge: 2022-11-18

## 2022-11-21 DIAGNOSIS — N186 End stage renal disease: Principal | ICD-10-CM

## 2022-11-21 DIAGNOSIS — E11621 Type 2 diabetes mellitus with foot ulcer: Principal | ICD-10-CM

## 2022-11-21 DIAGNOSIS — E1122 Type 2 diabetes mellitus with diabetic chronic kidney disease: Principal | ICD-10-CM

## 2022-11-21 DIAGNOSIS — L97509 Non-pressure chronic ulcer of other part of unspecified foot with unspecified severity: Principal | ICD-10-CM

## 2022-11-21 DIAGNOSIS — Z992 Dependence on renal dialysis: Principal | ICD-10-CM

## 2022-11-21 DIAGNOSIS — Z794 Long term (current) use of insulin: Principal | ICD-10-CM

## 2022-11-21 MED ORDER — PEN NEEDLE, DIABETIC 32 GAUGE X 5/32" (4 MM)
Freq: Three times a day (TID) | 0 refills | 67 days | Status: CP
Start: 2022-11-21 — End: ?

## 2022-11-21 MED ORDER — INSULIN GLARGINE (U-100) 100 UNIT/ML (3 ML) SUBCUTANEOUS PEN
Freq: Every evening | SUBCUTANEOUS | 3 refills | 115 days | Status: CP
Start: 2022-11-21 — End: ?

## 2022-11-22 ENCOUNTER — Ambulatory Visit: Admit: 2022-11-22 | Discharge: 2022-11-23 | Discharge status: Against Medical Advice | Payer: MEDICARE

## 2022-11-23 DIAGNOSIS — I10 Essential (primary) hypertension: Principal | ICD-10-CM

## 2022-11-23 MED ORDER — VALSARTAN 160 MG TABLET
ORAL_TABLET | Freq: Every evening | ORAL | 3 refills | 90 days | Status: CP
Start: 2022-11-23 — End: 2023-11-23

## 2022-11-27 ENCOUNTER — Encounter: Admit: 2022-11-27 | Payer: MEDICARE

## 2022-11-27 ENCOUNTER — Encounter: Admit: 2022-11-27 | Discharge: 2022-12-26 | Payer: MEDICARE

## 2022-11-29 ENCOUNTER — Ambulatory Visit: Admit: 2022-11-29 | Payer: MEDICARE | Attending: Psychiatric/Mental Health | Primary: Psychiatric/Mental Health

## 2022-12-06 ENCOUNTER — Institutional Professional Consult (permissible substitution): Admit: 2022-12-06 | Discharge: 2022-12-07 | Payer: MEDICARE

## 2022-12-08 ENCOUNTER — Telehealth
Admit: 2022-12-08 | Discharge: 2022-12-09 | Payer: MEDICARE | Attending: Psychiatric/Mental Health | Primary: Psychiatric/Mental Health

## 2022-12-08 DIAGNOSIS — F331 Major depressive disorder, recurrent, moderate: Principal | ICD-10-CM

## 2022-12-08 DIAGNOSIS — F3175 Bipolar disorder, in partial remission, most recent episode depressed: Principal | ICD-10-CM

## 2022-12-08 MED ORDER — SERTRALINE 50 MG TABLET
ORAL_TABLET | Freq: Every day | ORAL | 0 refills | 90 days | Status: CP
Start: 2022-12-08 — End: 2023-03-08

## 2022-12-08 MED ORDER — QUETIAPINE 300 MG TABLET
ORAL_TABLET | Freq: Every evening | ORAL | 0 refills | 90 days | Status: CP
Start: 2022-12-08 — End: 2023-03-08

## 2022-12-08 MED ORDER — LAMOTRIGINE 150 MG TABLET
ORAL_TABLET | Freq: Every day | ORAL | 0 refills | 90 days | Status: CP
Start: 2022-12-08 — End: 2023-03-08

## 2022-12-10 DIAGNOSIS — G2581 Restless legs syndrome: Principal | ICD-10-CM

## 2022-12-10 MED ORDER — TIZANIDINE 2 MG TABLET
ORAL_TABLET | 0 refills | 0 days
Start: 2022-12-10 — End: ?

## 2022-12-13 ENCOUNTER — Ambulatory Visit: Admit: 2022-12-13 | Discharge: 2022-12-14 | Payer: MEDICARE

## 2022-12-13 MED ORDER — METOPROLOL SUCCINATE ER 25 MG TABLET,EXTENDED RELEASE 24 HR
ORAL_TABLET | Freq: Every day | ORAL | 1 refills | 90 days | Status: CP
Start: 2022-12-13 — End: 2023-06-11

## 2022-12-13 MED ORDER — ROPINIROLE 0.5 MG TABLET
ORAL_TABLET | ORAL | 0 refills | 60 days | Status: CP
Start: 2022-12-13 — End: 2023-02-11

## 2022-12-13 MED ORDER — TIZANIDINE 2 MG TABLET
ORAL_TABLET | Freq: Two times a day (BID) | ORAL | 0 refills | 0.00000 days | Status: CP | PRN
Start: 2022-12-13 — End: 2022-12-13

## 2022-12-13 MED ORDER — OMEPRAZOLE 20 MG CAPSULE,DELAYED RELEASE
ORAL_CAPSULE | Freq: Every day | ORAL | 1 refills | 90 days | Status: CP
Start: 2022-12-13 — End: 2023-06-11

## 2022-12-14 DIAGNOSIS — G2581 Restless legs syndrome: Principal | ICD-10-CM

## 2022-12-14 MED ORDER — ROPINIROLE 0.5 MG TABLET
ORAL_TABLET | 0 refills | 0 days
Start: 2022-12-14 — End: ?

## 2022-12-15 DIAGNOSIS — J301 Allergic rhinitis due to pollen: Principal | ICD-10-CM

## 2022-12-21 DIAGNOSIS — M3500 Sicca syndrome, unspecified: Principal | ICD-10-CM

## 2022-12-21 MED ORDER — PILOCARPINE 5 MG TABLET
ORAL_TABLET | Freq: Every evening | ORAL | 11 refills | 30 days | Status: CP
Start: 2022-12-21 — End: 2023-12-21

## 2022-12-22 ENCOUNTER — Institutional Professional Consult (permissible substitution): Admit: 2022-12-22 | Discharge: 2022-12-23

## 2022-12-26 MED ORDER — ONDANSETRON HCL 4 MG TABLET
ORAL_TABLET | 0 refills | 0 days
Start: 2022-12-26 — End: ?

## 2022-12-27 ENCOUNTER — Encounter: Admit: 2022-12-27 | Payer: MEDICARE

## 2022-12-27 DIAGNOSIS — E785 Hyperlipidemia, unspecified: Principal | ICD-10-CM

## 2022-12-27 DIAGNOSIS — I10 Essential (primary) hypertension: Principal | ICD-10-CM

## 2022-12-27 MED ORDER — ONDANSETRON HCL 4 MG TABLET
ORAL_TABLET | 0 refills | 0 days
Start: 2022-12-27 — End: ?

## 2022-12-27 MED ORDER — AMLODIPINE 10 MG TABLET
ORAL_TABLET | Freq: Every day | ORAL | 3 refills | 90 days
Start: 2022-12-27 — End: 2023-12-27

## 2022-12-28 DIAGNOSIS — Z794 Long term (current) use of insulin: Principal | ICD-10-CM

## 2022-12-28 DIAGNOSIS — R059 Cough, unspecified type: Principal | ICD-10-CM

## 2022-12-28 DIAGNOSIS — E11621 Type 2 diabetes mellitus with foot ulcer: Principal | ICD-10-CM

## 2022-12-28 DIAGNOSIS — L97509 Non-pressure chronic ulcer of other part of unspecified foot with unspecified severity: Principal | ICD-10-CM

## 2022-12-28 MED ORDER — LANCING DEVICE
Freq: Four times a day (QID) | 10 refills | 0 days | Status: CP
Start: 2022-12-28 — End: ?

## 2022-12-28 MED ORDER — BLOOD GLUCOSE TEST STRIPS
ORAL_STRIP | 11 refills | 0.00000 days | Status: CP
Start: 2022-12-28 — End: 2022-12-28
  Filled 2023-01-03: qty 1, 1d supply, fill #0

## 2022-12-28 MED ORDER — LANCETS
11 refills | 0.00000 days | Status: CP
Start: 2022-12-28 — End: 2023-12-28

## 2022-12-29 DIAGNOSIS — U071 COVID-19: Principal | ICD-10-CM

## 2022-12-29 MED ORDER — PAXLOVID 150 MG-100 MG TABLETS IN A DOSE PACK (RENAL DOSE)
ORAL_TABLET | 0 refills | 0 days | Status: CP
Start: 2022-12-29 — End: ?

## 2022-12-30 ENCOUNTER — Telehealth: Admit: 2022-12-30 | Discharge: 2022-12-31 | Payer: MEDICARE | Attending: Family | Primary: Family

## 2022-12-30 DIAGNOSIS — Z992 Dependence on renal dialysis: Principal | ICD-10-CM

## 2022-12-30 DIAGNOSIS — R059 Cough, unspecified type: Principal | ICD-10-CM

## 2022-12-30 DIAGNOSIS — J029 Acute pharyngitis, unspecified: Principal | ICD-10-CM

## 2022-12-30 DIAGNOSIS — U071 COVID-19: Principal | ICD-10-CM

## 2022-12-30 DIAGNOSIS — Z794 Long term (current) use of insulin: Principal | ICD-10-CM

## 2022-12-30 DIAGNOSIS — I110 Hypertensive heart disease with heart failure: Principal | ICD-10-CM

## 2022-12-30 DIAGNOSIS — L97509 Non-pressure chronic ulcer of other part of unspecified foot with unspecified severity: Principal | ICD-10-CM

## 2022-12-30 DIAGNOSIS — I509 Heart failure, unspecified: Principal | ICD-10-CM

## 2022-12-30 DIAGNOSIS — E11621 Type 2 diabetes mellitus with foot ulcer: Principal | ICD-10-CM

## 2022-12-30 DIAGNOSIS — F1911 Other psychoactive substance abuse, in remission: Principal | ICD-10-CM

## 2022-12-30 DIAGNOSIS — B182 Chronic viral hepatitis C: Principal | ICD-10-CM

## 2022-12-30 MED ORDER — CODEINE 10 MG-GUAIFENESIN 100 MG/5 ML ORAL LIQUID
Freq: Three times a day (TID) | ORAL | 0 refills | 8 days | Status: CP | PRN
Start: 2022-12-30 — End: ?

## 2022-12-31 DIAGNOSIS — U071 COVID: Principal | ICD-10-CM

## 2023-01-01 ENCOUNTER — Ambulatory Visit: Admit: 2023-01-01 | Discharge: 2023-01-01 | Disposition: A | Discharge status: Home / Self Care | Payer: MEDICARE

## 2023-01-01 ENCOUNTER — Emergency Department: Admit: 2023-01-01 | Discharge: 2023-01-01 | Disposition: A | Discharge status: Home / Self Care | Payer: MEDICARE

## 2023-01-06 DIAGNOSIS — K121 Other forms of stomatitis: Principal | ICD-10-CM

## 2023-01-06 MED ORDER — TRIAMCINOLONE ACETONIDE 0.1 % DENTAL PASTE
1 refills | 0 days | Status: CP
Start: 2023-01-06 — End: ?

## 2023-01-08 ENCOUNTER — Ambulatory Visit
Admit: 2023-01-08 | Discharge: 2023-01-08 | Disposition: A | Discharge status: Home / Self Care | Payer: MEDICARE | Attending: Family

## 2023-01-08 ENCOUNTER — Emergency Department
Admit: 2023-01-08 | Discharge: 2023-01-08 | Disposition: A | Discharge status: Home / Self Care | Payer: MEDICARE | Attending: Family

## 2023-01-08 DIAGNOSIS — L97419 Non-pressure chronic ulcer of right heel and midfoot with unspecified severity: Principal | ICD-10-CM

## 2023-01-08 DIAGNOSIS — E11621 Type 2 diabetes mellitus with foot ulcer: Principal | ICD-10-CM

## 2023-01-08 DIAGNOSIS — R600 Localized edema: Principal | ICD-10-CM

## 2023-01-08 DIAGNOSIS — I10 Essential (primary) hypertension: Principal | ICD-10-CM

## 2023-01-08 DIAGNOSIS — M79661 Pain in right lower leg: Principal | ICD-10-CM

## 2023-01-08 MED ORDER — AMLODIPINE 10 MG TABLET
ORAL_TABLET | Freq: Every day | ORAL | 3 refills | 90 days
Start: 2023-01-08 — End: 2024-01-08

## 2023-01-09 DIAGNOSIS — I824Z1 Acute embolism and thrombosis of unspecified deep veins of right distal lower extremity: Principal | ICD-10-CM

## 2023-01-09 MED ORDER — AMLODIPINE 10 MG TABLET
ORAL_TABLET | Freq: Every day | ORAL | 3 refills | 90 days | Status: CP
Start: 2023-01-09 — End: 2024-01-09
  Filled 2023-01-16: qty 90, 90d supply, fill #0

## 2023-01-10 DIAGNOSIS — G2581 Restless legs syndrome: Principal | ICD-10-CM

## 2023-01-10 DIAGNOSIS — E785 Hyperlipidemia, unspecified: Principal | ICD-10-CM

## 2023-01-10 MED ORDER — TIZANIDINE 2 MG TABLET
ORAL_TABLET | 0 refills | 0 days
Start: 2023-01-10 — End: ?

## 2023-01-10 MED ORDER — VALSARTAN 160 MG TABLET
ORAL_TABLET | Freq: Every evening | ORAL | 3 refills | 90 days
Start: 2023-01-10 — End: 2024-01-10

## 2023-01-11 MED ORDER — TIZANIDINE 2 MG TABLET
ORAL_TABLET | Freq: Every evening | ORAL | 2 refills | 30 days | Status: CP | PRN
Start: 2023-01-11 — End: 2023-04-11
  Filled 2023-01-16: qty 30, 30d supply, fill #0

## 2023-01-11 MED ORDER — ATORVASTATIN 80 MG TABLET
ORAL_TABLET | Freq: Every day | ORAL | 3 refills | 90 days | Status: CP
Start: 2023-01-11 — End: 2024-01-11
  Filled 2023-01-16: qty 90, 90d supply, fill #0

## 2023-01-12 ENCOUNTER — Ambulatory Visit: Admit: 2023-01-12 | Discharge: 2023-01-13

## 2023-01-12 ENCOUNTER — Telehealth
Admit: 2023-01-12 | Discharge: 2023-01-13 | Attending: Student in an Organized Health Care Education/Training Program | Primary: Student in an Organized Health Care Education/Training Program

## 2023-01-12 DIAGNOSIS — K117 Disturbances of salivary secretion: Principal | ICD-10-CM

## 2023-01-12 DIAGNOSIS — H9201 Otalgia, right ear: Principal | ICD-10-CM

## 2023-01-12 MED ORDER — VALSARTAN 160 MG TABLET
ORAL_TABLET | Freq: Every evening | ORAL | 3 refills | 90 days | Status: CP
Start: 2023-01-12 — End: 2024-01-12
  Filled 2023-01-16: qty 90, 90d supply, fill #0

## 2023-01-13 ENCOUNTER — Telehealth
Admit: 2023-01-13 | Discharge: 2023-01-14 | Payer: MEDICARE | Attending: Psychiatric/Mental Health | Primary: Psychiatric/Mental Health

## 2023-01-17 DIAGNOSIS — E11621 Type 2 diabetes mellitus with foot ulcer: Principal | ICD-10-CM

## 2023-01-17 DIAGNOSIS — L97509 Non-pressure chronic ulcer of other part of unspecified foot with unspecified severity: Principal | ICD-10-CM

## 2023-01-17 DIAGNOSIS — Z794 Long term (current) use of insulin: Principal | ICD-10-CM

## 2023-01-17 MED ORDER — INSULIN GLARGINE (U-100) 100 UNIT/ML (3 ML) SUBCUTANEOUS PEN
Freq: Every evening | SUBCUTANEOUS | 3 refills | 115 days
Start: 2023-01-17 — End: ?

## 2023-01-18 DIAGNOSIS — F3175 Bipolar disorder, in partial remission, most recent episode depressed: Principal | ICD-10-CM

## 2023-01-18 DIAGNOSIS — F331 Major depressive disorder, recurrent, moderate: Principal | ICD-10-CM

## 2023-01-18 MED ORDER — INSULIN GLARGINE (U-100) 100 UNIT/ML (3 ML) SUBCUTANEOUS PEN
Freq: Every evening | SUBCUTANEOUS | 3 refills | 115 days
Start: 2023-01-18 — End: ?

## 2023-01-18 MED ORDER — QUETIAPINE 300 MG TABLET
ORAL_TABLET | Freq: Every evening | ORAL | 0 refills | 90 days
Start: 2023-01-18 — End: 2023-04-18

## 2023-01-19 DIAGNOSIS — G2581 Restless legs syndrome: Principal | ICD-10-CM

## 2023-01-19 DIAGNOSIS — L97509 Non-pressure chronic ulcer of other part of unspecified foot with unspecified severity: Principal | ICD-10-CM

## 2023-01-19 DIAGNOSIS — Z794 Long term (current) use of insulin: Principal | ICD-10-CM

## 2023-01-19 DIAGNOSIS — E11621 Type 2 diabetes mellitus with foot ulcer: Principal | ICD-10-CM

## 2023-01-19 MED ORDER — INSULIN GLARGINE (U-100) 100 UNIT/ML (3 ML) SUBCUTANEOUS PEN
Freq: Every evening | SUBCUTANEOUS | 3 refills | 75 days
Start: 2023-01-19 — End: ?

## 2023-01-19 MED ORDER — GABAPENTIN 100 MG CAPSULE
ORAL_CAPSULE | 0 refills | 0 days
Start: 2023-01-19 — End: ?

## 2023-01-19 MED ORDER — QUETIAPINE 300 MG TABLET
ORAL_TABLET | Freq: Every evening | ORAL | 0 refills | 90 days | Status: CP
Start: 2023-01-19 — End: 2023-04-19
  Filled 2023-01-24: qty 90, 90d supply, fill #0

## 2023-01-20 MED ORDER — GABAPENTIN 100 MG CAPSULE
ORAL_CAPSULE | 0 refills | 0 days | Status: CP
Start: 2023-01-20 — End: ?
  Filled 2023-01-24: qty 270, 90d supply, fill #0

## 2023-01-20 MED ORDER — INSULIN GLARGINE (U-100) 100 UNIT/ML (3 ML) SUBCUTANEOUS PEN
Freq: Every evening | SUBCUTANEOUS | 3 refills | 75 days | Status: CP
Start: 2023-01-20 — End: ?
  Filled 2023-01-24: qty 15, 75d supply, fill #0

## 2023-01-24 ENCOUNTER — Ambulatory Visit: Admit: 2023-01-24 | Discharge: 2023-01-25

## 2023-01-24 ENCOUNTER — Telehealth: Admit: 2023-01-24 | Discharge: 2023-01-25

## 2023-01-25 MED ORDER — PROMETHAZINE 25 MG TABLET
ORAL_TABLET | Freq: Three times a day (TID) | ORAL | 0 refills | 4 days | Status: CP | PRN
Start: 2023-01-25 — End: 2023-02-24
  Filled 2023-01-27: qty 10, 4d supply, fill #0

## 2023-01-26 ENCOUNTER — Encounter: Admit: 2023-01-26 | Payer: MEDICARE

## 2023-01-26 ENCOUNTER — Ambulatory Visit
Admit: 2023-01-26 | Discharge: 2023-01-27 | Payer: MEDICARE | Attending: Student in an Organized Health Care Education/Training Program | Primary: Student in an Organized Health Care Education/Training Program

## 2023-01-26 DIAGNOSIS — K219 Gastro-esophageal reflux disease without esophagitis: Principal | ICD-10-CM

## 2023-01-26 MED ORDER — PRAMIPEXOLE 0.25 MG TABLET
ORAL_TABLET | ORAL | 2 refills | 16 days | Status: CP
Start: 2023-01-26 — End: 2023-05-10

## 2023-01-26 MED ORDER — PANTOPRAZOLE 20 MG TABLET,DELAYED RELEASE
ORAL_TABLET | Freq: Every day | ORAL | 0 refills | 90.00000 days | Status: CP
Start: 2023-01-26 — End: 2024-01-26

## 2023-01-26 MED ORDER — PILOCARPINE 5 MG TABLET
ORAL_TABLET | Freq: Three times a day (TID) | ORAL | 11 refills | 30 days | Status: CP
Start: 2023-01-26 — End: 2024-01-26

## 2023-01-27 MED ORDER — PRAMIPEXOLE 0.25 MG TABLET
ORAL_TABLET | ORAL | 2 refills | 18 days | Status: CP
Start: 2023-01-27 — End: 2023-05-04

## 2023-01-27 MED ORDER — HYDROXYZINE PAMOATE 50 MG CAPSULE
ORAL_CAPSULE | Freq: Every day | ORAL | 0 refills | 90 days | Status: CP | PRN
Start: 2023-01-27 — End: 2023-04-27
  Filled 2023-02-08: qty 90, 90d supply, fill #0

## 2023-01-31 ENCOUNTER — Institutional Professional Consult (permissible substitution): Admit: 2023-01-31 | Discharge: 2023-02-01

## 2023-02-02 DIAGNOSIS — E1143 Type 2 diabetes mellitus with diabetic autonomic (poly)neuropathy: Principal | ICD-10-CM

## 2023-02-02 DIAGNOSIS — E559 Vitamin D deficiency, unspecified: Principal | ICD-10-CM

## 2023-02-02 DIAGNOSIS — M86171 Other acute osteomyelitis, right ankle and foot: Principal | ICD-10-CM

## 2023-02-02 DIAGNOSIS — E785 Hyperlipidemia, unspecified: Principal | ICD-10-CM

## 2023-02-02 DIAGNOSIS — L97412 Non-pressure chronic ulcer of right heel and midfoot with fat layer exposed: Principal | ICD-10-CM

## 2023-02-02 DIAGNOSIS — I272 Pulmonary hypertension, unspecified: Principal | ICD-10-CM

## 2023-02-02 DIAGNOSIS — G894 Chronic pain syndrome: Principal | ICD-10-CM

## 2023-02-02 DIAGNOSIS — E11621 Type 2 diabetes mellitus with foot ulcer: Principal | ICD-10-CM

## 2023-02-02 DIAGNOSIS — E1169 Type 2 diabetes mellitus with other specified complication: Principal | ICD-10-CM

## 2023-02-02 DIAGNOSIS — E1142 Type 2 diabetes mellitus with diabetic polyneuropathy: Principal | ICD-10-CM

## 2023-02-02 DIAGNOSIS — I70203 Unspecified atherosclerosis of native arteries of extremities, bilateral legs: Principal | ICD-10-CM

## 2023-02-02 DIAGNOSIS — F319 Bipolar disorder, unspecified: Principal | ICD-10-CM

## 2023-02-02 DIAGNOSIS — I151 Hypertension secondary to other renal disorders: Principal | ICD-10-CM

## 2023-02-02 DIAGNOSIS — G2581 Restless legs syndrome: Principal | ICD-10-CM

## 2023-02-02 DIAGNOSIS — I110 Hypertensive heart disease with heart failure: Principal | ICD-10-CM

## 2023-02-02 DIAGNOSIS — B182 Chronic viral hepatitis C: Principal | ICD-10-CM

## 2023-02-02 DIAGNOSIS — E890 Postprocedural hypothyroidism: Principal | ICD-10-CM

## 2023-02-02 DIAGNOSIS — E1151 Type 2 diabetes mellitus with diabetic peripheral angiopathy without gangrene: Principal | ICD-10-CM

## 2023-02-02 DIAGNOSIS — M5136 Other intervertebral disc degeneration, lumbar region: Principal | ICD-10-CM

## 2023-02-02 DIAGNOSIS — N186 End stage renal disease: Principal | ICD-10-CM

## 2023-02-02 DIAGNOSIS — K219 Gastro-esophageal reflux disease without esophagitis: Principal | ICD-10-CM

## 2023-02-02 DIAGNOSIS — I2510 Atherosclerotic heart disease of native coronary artery without angina pectoris: Principal | ICD-10-CM

## 2023-02-02 DIAGNOSIS — D631 Anemia in chronic kidney disease: Principal | ICD-10-CM

## 2023-02-02 DIAGNOSIS — I5042 Chronic combined systolic (congestive) and diastolic (congestive) heart failure: Principal | ICD-10-CM

## 2023-02-02 DIAGNOSIS — F419 Anxiety disorder, unspecified: Principal | ICD-10-CM

## 2023-02-03 DIAGNOSIS — Z794 Long term (current) use of insulin: Principal | ICD-10-CM

## 2023-02-03 DIAGNOSIS — E11621 Type 2 diabetes mellitus with foot ulcer: Principal | ICD-10-CM

## 2023-02-03 DIAGNOSIS — G2581 Restless legs syndrome: Principal | ICD-10-CM

## 2023-02-03 DIAGNOSIS — G4700 Insomnia, unspecified: Principal | ICD-10-CM

## 2023-02-03 DIAGNOSIS — L97509 Non-pressure chronic ulcer of other part of unspecified foot with unspecified severity: Principal | ICD-10-CM

## 2023-02-03 MED ORDER — PRAMIPEXOLE 0.25 MG TABLET
ORAL_TABLET | ORAL | 2 refills | 18 days
Start: 2023-02-03 — End: 2023-05-10

## 2023-02-06 DIAGNOSIS — G2581 Restless legs syndrome: Principal | ICD-10-CM

## 2023-02-06 MED ORDER — TIZANIDINE 2 MG TABLET
ORAL_TABLET | Freq: Every evening | ORAL | 2 refills | 15 days | Status: CP | PRN
Start: 2023-02-06 — End: 2023-05-07

## 2023-02-09 DIAGNOSIS — I5042 Chronic combined systolic (congestive) and diastolic (congestive) heart failure: Principal | ICD-10-CM

## 2023-02-09 DIAGNOSIS — B182 Chronic viral hepatitis C: Principal | ICD-10-CM

## 2023-02-09 DIAGNOSIS — M86171 Other acute osteomyelitis, right ankle and foot: Principal | ICD-10-CM

## 2023-02-09 DIAGNOSIS — M5136 Other intervertebral disc degeneration, lumbar region: Principal | ICD-10-CM

## 2023-02-09 DIAGNOSIS — G2581 Restless legs syndrome: Principal | ICD-10-CM

## 2023-02-09 DIAGNOSIS — E1142 Type 2 diabetes mellitus with diabetic polyneuropathy: Principal | ICD-10-CM

## 2023-02-09 DIAGNOSIS — E11621 Type 2 diabetes mellitus with foot ulcer: Principal | ICD-10-CM

## 2023-02-09 DIAGNOSIS — L97412 Non-pressure chronic ulcer of right heel and midfoot with fat layer exposed: Principal | ICD-10-CM

## 2023-02-09 DIAGNOSIS — D631 Anemia in chronic kidney disease: Principal | ICD-10-CM

## 2023-02-09 DIAGNOSIS — I110 Hypertensive heart disease with heart failure: Principal | ICD-10-CM

## 2023-02-09 DIAGNOSIS — E1143 Type 2 diabetes mellitus with diabetic autonomic (poly)neuropathy: Principal | ICD-10-CM

## 2023-02-09 DIAGNOSIS — F419 Anxiety disorder, unspecified: Principal | ICD-10-CM

## 2023-02-09 DIAGNOSIS — E1151 Type 2 diabetes mellitus with diabetic peripheral angiopathy without gangrene: Principal | ICD-10-CM

## 2023-02-09 DIAGNOSIS — E559 Vitamin D deficiency, unspecified: Principal | ICD-10-CM

## 2023-02-09 DIAGNOSIS — I2510 Atherosclerotic heart disease of native coronary artery without angina pectoris: Principal | ICD-10-CM

## 2023-02-09 DIAGNOSIS — K219 Gastro-esophageal reflux disease without esophagitis: Principal | ICD-10-CM

## 2023-02-09 DIAGNOSIS — I272 Pulmonary hypertension, unspecified: Principal | ICD-10-CM

## 2023-02-09 DIAGNOSIS — G894 Chronic pain syndrome: Principal | ICD-10-CM

## 2023-02-09 DIAGNOSIS — I151 Hypertension secondary to other renal disorders: Principal | ICD-10-CM

## 2023-02-09 DIAGNOSIS — E890 Postprocedural hypothyroidism: Principal | ICD-10-CM

## 2023-02-09 DIAGNOSIS — E785 Hyperlipidemia, unspecified: Principal | ICD-10-CM

## 2023-02-09 DIAGNOSIS — E1169 Type 2 diabetes mellitus with other specified complication: Principal | ICD-10-CM

## 2023-02-09 DIAGNOSIS — I70203 Unspecified atherosclerosis of native arteries of extremities, bilateral legs: Principal | ICD-10-CM

## 2023-02-09 DIAGNOSIS — F319 Bipolar disorder, unspecified: Principal | ICD-10-CM

## 2023-02-09 DIAGNOSIS — N186 End stage renal disease: Principal | ICD-10-CM

## 2023-02-14 ENCOUNTER — Institutional Professional Consult (permissible substitution): Admit: 2023-02-14 | Discharge: 2023-02-15

## 2023-02-15 DIAGNOSIS — E1142 Type 2 diabetes mellitus with diabetic polyneuropathy: Principal | ICD-10-CM

## 2023-02-15 DIAGNOSIS — F319 Bipolar disorder, unspecified: Principal | ICD-10-CM

## 2023-02-15 DIAGNOSIS — E1151 Type 2 diabetes mellitus with diabetic peripheral angiopathy without gangrene: Principal | ICD-10-CM

## 2023-02-15 DIAGNOSIS — K219 Gastro-esophageal reflux disease without esophagitis: Principal | ICD-10-CM

## 2023-02-15 DIAGNOSIS — E1169 Type 2 diabetes mellitus with other specified complication: Principal | ICD-10-CM

## 2023-02-15 DIAGNOSIS — N186 End stage renal disease: Principal | ICD-10-CM

## 2023-02-15 DIAGNOSIS — D631 Anemia in chronic kidney disease: Principal | ICD-10-CM

## 2023-02-15 DIAGNOSIS — I110 Hypertensive heart disease with heart failure: Principal | ICD-10-CM

## 2023-02-15 DIAGNOSIS — E1143 Type 2 diabetes mellitus with diabetic autonomic (poly)neuropathy: Principal | ICD-10-CM

## 2023-02-15 DIAGNOSIS — I5042 Chronic combined systolic (congestive) and diastolic (congestive) heart failure: Principal | ICD-10-CM

## 2023-02-15 DIAGNOSIS — E890 Postprocedural hypothyroidism: Principal | ICD-10-CM

## 2023-02-15 DIAGNOSIS — B182 Chronic viral hepatitis C: Principal | ICD-10-CM

## 2023-02-15 DIAGNOSIS — L97412 Non-pressure chronic ulcer of right heel and midfoot with fat layer exposed: Principal | ICD-10-CM

## 2023-02-15 DIAGNOSIS — M86171 Other acute osteomyelitis, right ankle and foot: Principal | ICD-10-CM

## 2023-02-15 DIAGNOSIS — E11621 Type 2 diabetes mellitus with foot ulcer: Principal | ICD-10-CM

## 2023-02-15 DIAGNOSIS — E785 Hyperlipidemia, unspecified: Principal | ICD-10-CM

## 2023-02-15 DIAGNOSIS — G894 Chronic pain syndrome: Principal | ICD-10-CM

## 2023-02-15 DIAGNOSIS — F419 Anxiety disorder, unspecified: Principal | ICD-10-CM

## 2023-02-15 DIAGNOSIS — I2510 Atherosclerotic heart disease of native coronary artery without angina pectoris: Principal | ICD-10-CM

## 2023-02-15 DIAGNOSIS — I151 Hypertension secondary to other renal disorders: Principal | ICD-10-CM

## 2023-02-15 DIAGNOSIS — G2581 Restless legs syndrome: Principal | ICD-10-CM

## 2023-02-15 DIAGNOSIS — I70203 Unspecified atherosclerosis of native arteries of extremities, bilateral legs: Principal | ICD-10-CM

## 2023-02-15 DIAGNOSIS — M5136 Other intervertebral disc degeneration, lumbar region: Principal | ICD-10-CM

## 2023-02-15 DIAGNOSIS — I272 Pulmonary hypertension, unspecified: Principal | ICD-10-CM

## 2023-02-15 DIAGNOSIS — E559 Vitamin D deficiency, unspecified: Principal | ICD-10-CM

## 2023-02-17 MED ORDER — ONDANSETRON HCL 4 MG TABLET
ORAL_TABLET | Freq: Every day | ORAL | 0 refills | 10 days | Status: CP | PRN
Start: 2023-02-17 — End: ?

## 2023-02-21 ENCOUNTER — Institutional Professional Consult (permissible substitution): Admit: 2023-02-21 | Discharge: 2023-02-22

## 2023-02-25 ENCOUNTER — Encounter: Admit: 2023-02-25 | Payer: MEDICARE

## 2023-03-01 DIAGNOSIS — R112 Nausea with vomiting, unspecified: Principal | ICD-10-CM

## 2023-03-02 ENCOUNTER — Institutional Professional Consult (permissible substitution): Admit: 2023-03-02 | Discharge: 2023-03-03 | Payer: MEDICARE

## 2023-03-02 ENCOUNTER — Telehealth
Admit: 2023-03-02 | Discharge: 2023-03-03 | Payer: MEDICARE | Attending: Psychiatric/Mental Health | Primary: Psychiatric/Mental Health

## 2023-03-02 DIAGNOSIS — F3175 Bipolar disorder, in partial remission, most recent episode depressed: Principal | ICD-10-CM

## 2023-03-02 DIAGNOSIS — F3170 Bipolar disorder, currently in remission, most recent episode unspecified: Principal | ICD-10-CM

## 2023-03-02 DIAGNOSIS — F331 Major depressive disorder, recurrent, moderate: Principal | ICD-10-CM

## 2023-03-02 MED ORDER — QUETIAPINE 300 MG TABLET
ORAL_TABLET | Freq: Every evening | ORAL | 0 refills | 90 days | Status: CP
Start: 2023-03-02 — End: 2023-05-31

## 2023-03-02 MED ORDER — SERTRALINE 50 MG TABLET
ORAL_TABLET | Freq: Every day | ORAL | 0 refills | 90 days | Status: CP
Start: 2023-03-02 — End: 2023-05-31

## 2023-03-02 MED ORDER — LAMOTRIGINE 150 MG TABLET
ORAL_TABLET | Freq: Every day | ORAL | 0 refills | 90 days | Status: CP
Start: 2023-03-02 — End: 2023-05-31

## 2023-03-07 ENCOUNTER — Telehealth: Admit: 2023-03-07 | Discharge: 2023-03-08

## 2023-03-07 ENCOUNTER — Institutional Professional Consult (permissible substitution): Admit: 2023-03-07 | Discharge: 2023-03-08

## 2023-03-07 MED ORDER — POLYETHYLENE GLYCOL 3350 17 GRAM ORAL POWDER PACKET
PACK | Freq: Two times a day (BID) | ORAL | 2 refills | 36 days | Status: CP | PRN
Start: 2023-03-07 — End: ?

## 2023-03-07 MED ORDER — SENNOSIDES 8.6 MG TABLET
ORAL_TABLET | Freq: Two times a day (BID) | ORAL | 2 refills | 15 days | Status: CP | PRN
Start: 2023-03-07 — End: ?

## 2023-03-09 MED ORDER — BUMETANIDE 2 MG TABLET
ORAL_TABLET | 3 refills | 0 days
Start: 2023-03-09 — End: ?

## 2023-03-13 MED ORDER — BUMETANIDE 2 MG TABLET
ORAL_TABLET | 3 refills | 0 days | Status: CP
Start: 2023-03-13 — End: ?

## 2023-03-14 DIAGNOSIS — T829XXA Unspecified complication of cardiac and vascular prosthetic device, implant and graft, initial encounter: Principal | ICD-10-CM

## 2023-03-16 ENCOUNTER — Ambulatory Visit: Admit: 2023-03-16 | Discharge: 2023-03-17 | Payer: MEDICARE

## 2023-03-16 ENCOUNTER — Ambulatory Visit
Admit: 2023-03-16 | Discharge: 2023-03-18 | Disposition: A | Discharge status: Home / Self Care | Payer: MEDICARE | Source: Ambulatory Visit | Admitting: Student in an Organized Health Care Education/Training Program

## 2023-03-16 ENCOUNTER — Ambulatory Visit
Admit: 2023-03-16 | Discharge: 2023-03-17 | Payer: MEDICARE | Attending: Student in an Organized Health Care Education/Training Program | Primary: Student in an Organized Health Care Education/Training Program

## 2023-03-17 MED ORDER — APIXABAN 5 MG TABLET
ORAL_TABLET | Freq: Two times a day (BID) | ORAL | 0 refills | 30 days | Status: CP
Start: 2023-03-17 — End: 2023-03-17

## 2023-03-18 MED ORDER — APIXABAN 5 MG TABLET
ORAL_TABLET | ORAL | 2 refills | 35 days | Status: CP
Start: 2023-03-18 — End: 2023-04-22

## 2023-03-27 ENCOUNTER — Encounter: Admit: 2023-03-27 | Payer: MEDICARE

## 2023-03-27 ENCOUNTER — Encounter: Admit: 2023-03-27 | Discharge: 2023-04-25 | Payer: MEDICARE

## 2023-03-28 ENCOUNTER — Institutional Professional Consult (permissible substitution): Admit: 2023-03-28 | Discharge: 2023-03-29

## 2023-03-31 DIAGNOSIS — I10 Essential (primary) hypertension: Principal | ICD-10-CM

## 2023-04-01 ENCOUNTER — Ambulatory Visit
Admission: EM | Admit: 2023-04-01 | Discharge: 2023-04-01 | Disposition: A | Discharge status: Home / Self Care | Payer: Medicare PPO | Attending: Family Medicine | Admitting: Family Medicine

## 2023-04-01 ENCOUNTER — Encounter: Payer: Self-pay | Admitting: Emergency Medicine

## 2023-04-01 DIAGNOSIS — M7918 Myalgia, other site: Secondary | ICD-10-CM | POA: Diagnosis not present

## 2023-04-01 HISTORY — DX: Other pulmonary embolism without acute cor pulmonale: I26.99

## 2023-04-01 MED ORDER — BACLOFEN 10 MG PO TABS: 5.0000 mg | ORAL_TABLET | Freq: Two times a day (BID) | ORAL | 0 refills | Status: AC | PRN

## 2023-04-01 NOTE — ED Triage Notes (Signed)
Patient c/o right shoulder pain that started 4 days ago.  Patient reports difficulty lifting up with her right arm and limited ROM in her right shoulder.  Patient denies injury or fall.

## 2023-04-01 NOTE — Discharge Instructions (Signed)
Rest.  Heat.  Medication as prescribed.  Take care  Dr. Thu Baggett  

## 2023-04-01 NOTE — ED Provider Notes (Signed)
[facility]    CSN: [id number] Arrival date & time: 04/01/23  1022      History   Chief Complaint Chief Complaint  Patient presents with   Shoulder Pain    HPI  57 year old female with an extensive PMH including history of pulmonary embolism, end-stage renal disease on hemodialysis, cardiovascular disease, hypertension, type 2 diabetes presents for evaluation of the above.  Pain is localized to the posterior neck and goes towards the shoulder.  Started approximately 4 days ago.  [place] activity.  No recent fall, trauma.  Seems to be aided by range of motion of the shoulder and stretching.  No reports of numbness or tingling.  No other associated symptoms.  No other complaints.  Home Medications    Prior to Admission medications   Medication Sig Start Date End Date Taking? Authorizing Provider  apixaban (ELIQUIS) 5 MG TABS tablet Take by mouth. 03/20/23  Yes [provider]  baclofen (LIORESAL) 10 MG tablet Take 0.5-1 tablets (5-10 mg total) by mouth 2 (two) times daily as needed for muscle spasms. 04/01/23  Yes Sarit Sparano G, DO  Aspirin Buf,CaCarb-MgCarb-MgO, 81 MG TABS Take 1 tablet by mouth daily. 05/09/18   [provider]  atorvastatin (LIPITOR) 40 MG tablet Take 1 tablet by mouth daily. 04/19/21   [provider]  bumetanide (BUMEX) 1 MG tablet 2 tablet 2 TIMES DAILY (route: oral) 08/25/20   [provider]  carvedilol (COREG) 25 MG tablet Take 1 tablet by mouth 2 (two) times daily. 07/04/18   [provider]  EPCLUSA 400-100 MG TABS Take 1 tablet by mouth daily. 04/19/21   [provider]  fluticasone Aleda Grana) 50 MCG/ACT nasal spray 1 spray DAILY (route: nasal) 01/22/21   [provider]  hydrOXYzine (ATARAX/VISTARIL) 50 MG tablet Take 50 mg by mouth 2 (two) times daily. 04/13/21   [provider]  insulin glargine (LANTUS SOLOSTAR) 100 UNIT/ML  Solostar Pen 2 unit BEDTIME (route: subcutaneous) 01/15/21   [provider]  insulin lispro (HUMALOG) 100 UNIT/ML KwikPen Inject 10 Units into the skin 3 (three) times daily. 05/25/21   Tommie Sams, DO  lamoTRIgine (LAMICTAL) 100 MG tablet Take 100 mg by mouth daily. 05/04/21   [provider]  levothyroxine (SYNTHROID) 75 MCG tablet 1 tablet DAILY (route: oral) 01/15/21   [provider]  lidocaine (XYLOCAINE) 2 % solution Use as directed 15 mLs in the mouth or throat every 4 (four) hours as needed for mouth pain. 01/08/22   White, Elita Boone, NP  metoCLOPramide (REGLAN) 5 MG tablet Take by mouth. 09/19/18   [provider]  metoprolol tartrate (LOPRESSOR) 100 MG tablet 1 tablet 2 TIMES DAILY (route: oral) 11/03/20   [provider]  pregabalin (LYRICA) 75 MG capsule Take 75 mg by mouth daily. 01/05/21   [provider]  QUEtiapine (SEROQUEL XR) 50 MG TB24 24 hr tablet Take by mouth. 09/19/18   [provider]  sertraline (ZOLOFT) 50 MG tablet Take 3 tablets by mouth daily. 04/13/21   [provider]  ALBUTEROL IN 2 puff 2 TIMES DAILY (route: inhalation) 01/22/21 05/25/21  [provider]  CLONAZEPAM PO 0.5 tablet AS DIRECTED (route: oral) 12/11/20 05/25/21  [provider]    Family History Family History  Problem Relation Age of Onset   Hypertension Mother    Diabetes Mother    Cancer Mother    Hypertension Father  Diabetes Father    Heart failure Father     Social History Social History   Tobacco Use   Smoking status: Former    Types: Cigarettes    Quit date: 03/07/2021    Years since quitting: 2.0   Smokeless tobacco: Never  Vaping Use   Vaping Use: Never used  Substance Use Topics   Alcohol use: Never   Drug use: Never     Allergies   Penicillins, Morphine, and Nsaids   Review of Systems Review of Systems Per HPI  Physical Exam Triage Vital Signs ED Triage Vitals  Enc Vitals Group      BP 04/01/23 1053 (!) 153/80     Pulse Rate 04/01/23 1053 60     Resp 04/01/23 1053 15     Temp 04/01/23 1053 98.7 F (37.1 C)     Temp Source 04/01/23 1053 Oral     SpO2 04/01/23 1053 94 %     Weight 04/01/23 1049 179 lb 14.3 oz (81.6 kg)     Height 04/01/23 1049 5\' 5"  (1.651 m)     Head Circumference --      Peak Flow --      Pain Score 04/01/23 1049 3     Pain Loc --      Pain Edu? --      Excl. in GC? --    No data found.  Updated Vital Signs BP (!) 153/80 (BP Location: Right Arm)   Pulse 60   Temp 98.7 F (37.1 C) (Oral)   Resp 15   Ht 5\' 5"  (1.651 m)   Wt 81.6 kg   SpO2 94%   BMI 29.94 kg/m   Visual Acuity Right Eye Distance:   Left Eye Distance:   Bilateral Distance:    Right Eye Near:   Left Eye Near:    Bilateral Near:     Physical Exam Constitutional:      Appearance: Normal appearance.  HENT:     Head: Normocephalic and atraumatic.  Neck:     Comments: Tenderness along the right trapezius. Pulmonary:     Effort: Pulmonary effort is normal. No respiratory distress.  Musculoskeletal:     Comments: Right shoulder -normal range of motion.  Positive Hawkins.  Negative empty can.  Normal rotator cuff strength.  Neurological:     Mental Status: She is alert.  Psychiatric:        Mood and Affect: Mood normal.        Behavior: Behavior normal.      UC Treatments / Results  Labs (all labs ordered are listed, but only abnormal results are displayed) Labs Reviewed - No data to display  EKG   Radiology No results found.  Procedures Procedures (including critical care time)  Medications Ordered in UC Medications - No data to display  Initial Impression / Assessment and Plan / UC Course  I have reviewed the triage vital signs and the nursing notes.  Pertinent labs & imaging results that were available during my care of the patient were reviewed by me and considered in my medical decision making (see chart for details).    57 year old  female with an extensive past medical history presents with musculoskeletal pain.  I feel that this is mostly muscular in nature.  I do not feel like this is coming from the shoulder.  Treating with baclofen.  Supportive care.  Final Clinical Impressions(s) / UC Diagnoses   Final diagnoses:  Musculoskeletal pain  Discharge Instructions      Rest. Heat.  Medication as prescribed.  Take care  Dr. Adriana Simas    ED Prescriptions     Medication Sig Dispense Auth. Provider   baclofen (LIORESAL) 10 MG tablet Take 0.5-1 tablets (5-10 mg total) by mouth 2 (two) times daily as needed for muscle spasms. 30 each Tommie Sams, DO      PDMP not reviewed this encounter.   Tommie Sams, [place] 04/01/23 1116

## 2023-04-03 ENCOUNTER — Ambulatory Visit
Admit: 2023-04-03 | Discharge: 2023-04-06 | Disposition: A | Discharge status: Home Health | Payer: MEDICARE | Admitting: Internal Medicine

## 2023-04-04 MED FILL — LANTUS SOLOSTAR U-100 INSULIN 100 UNIT/ML (3 ML) SUBCUTANEOUS PEN: SUBCUTANEOUS | 75 days supply | Qty: 15 | Fill #1

## 2023-04-05 MED ORDER — LEVOTHYROXINE 75 MCG TABLET
ORAL_TABLET | Freq: Every day | ORAL | 30.00000 days
Start: 2023-04-05 — End: ?

## 2023-04-06 ENCOUNTER — Ambulatory Visit: Admit: 2023-04-06 | Payer: MEDICARE

## 2023-04-06 MED ORDER — METOPROLOL SUCCINATE ER 25 MG TABLET,EXTENDED RELEASE 24 HR
ORAL_TABLET | Freq: Every day | ORAL | 0 refills | 30.00000 days | Status: CN
Start: 2023-04-06 — End: ?

## 2023-04-06 MED ORDER — DOXYCYCLINE HYCLATE 100 MG TABLET
ORAL_TABLET | Freq: Two times a day (BID) | ORAL | 0 refills | 7.00000 days | Status: CP
Start: 2023-04-06 — End: 2023-04-13

## 2023-04-06 MED ORDER — LEVOTHYROXINE 75 MCG TABLET
ORAL_TABLET | Freq: Every day | ORAL | 0 refills | 30.00000 days | Status: CP
Start: 2023-04-06 — End: ?

## 2023-04-11 ENCOUNTER — Ambulatory Visit: Admit: 2023-04-11 | Discharge: 2023-04-12 | Payer: MEDICARE

## 2023-04-11 ENCOUNTER — Telehealth
Admit: 2023-04-11 | Discharge: 2023-04-12 | Payer: MEDICARE | Attending: Psychiatric/Mental Health | Primary: Psychiatric/Mental Health

## 2023-04-11 DIAGNOSIS — E1142 Type 2 diabetes mellitus with diabetic polyneuropathy: Principal | ICD-10-CM

## 2023-04-11 DIAGNOSIS — F1911 Other psychoactive substance abuse, in remission: Principal | ICD-10-CM

## 2023-04-11 DIAGNOSIS — R110 Nausea: Principal | ICD-10-CM

## 2023-04-11 DIAGNOSIS — N186 End stage renal disease: Principal | ICD-10-CM

## 2023-04-11 DIAGNOSIS — E1143 Type 2 diabetes mellitus with diabetic autonomic (poly)neuropathy: Principal | ICD-10-CM

## 2023-04-11 DIAGNOSIS — F419 Anxiety disorder, unspecified: Principal | ICD-10-CM

## 2023-04-11 DIAGNOSIS — G894 Chronic pain syndrome: Principal | ICD-10-CM

## 2023-04-11 DIAGNOSIS — G629 Polyneuropathy, unspecified: Principal | ICD-10-CM

## 2023-04-11 DIAGNOSIS — E785 Hyperlipidemia, unspecified: Principal | ICD-10-CM

## 2023-04-11 DIAGNOSIS — E559 Vitamin D deficiency, unspecified: Principal | ICD-10-CM

## 2023-04-11 DIAGNOSIS — I5042 Chronic combined systolic (congestive) and diastolic (congestive) heart failure: Principal | ICD-10-CM

## 2023-04-11 DIAGNOSIS — M86171 Other acute osteomyelitis, right ankle and foot: Principal | ICD-10-CM

## 2023-04-11 DIAGNOSIS — L97509 Non-pressure chronic ulcer of other part of unspecified foot with unspecified severity: Principal | ICD-10-CM

## 2023-04-11 DIAGNOSIS — E1122 Type 2 diabetes mellitus with diabetic chronic kidney disease: Principal | ICD-10-CM

## 2023-04-11 DIAGNOSIS — I2510 Atherosclerotic heart disease of native coronary artery without angina pectoris: Principal | ICD-10-CM

## 2023-04-11 DIAGNOSIS — E039 Hypothyroidism, unspecified: Principal | ICD-10-CM

## 2023-04-11 DIAGNOSIS — D631 Anemia in chronic kidney disease: Principal | ICD-10-CM

## 2023-04-11 DIAGNOSIS — F3175 Bipolar disorder, in partial remission, most recent episode depressed: Principal | ICD-10-CM

## 2023-04-11 DIAGNOSIS — I272 Pulmonary hypertension, unspecified: Principal | ICD-10-CM

## 2023-04-11 DIAGNOSIS — Z992 Dependence on renal dialysis: Principal | ICD-10-CM

## 2023-04-11 DIAGNOSIS — Z794 Long term (current) use of insulin: Principal | ICD-10-CM

## 2023-04-11 DIAGNOSIS — E11621 Type 2 diabetes mellitus with foot ulcer: Principal | ICD-10-CM

## 2023-04-11 DIAGNOSIS — I110 Hypertensive heart disease with heart failure: Principal | ICD-10-CM

## 2023-04-11 DIAGNOSIS — E1151 Type 2 diabetes mellitus with diabetic peripheral angiopathy without gangrene: Principal | ICD-10-CM

## 2023-04-11 DIAGNOSIS — I151 Hypertension secondary to other renal disorders: Principal | ICD-10-CM

## 2023-04-11 DIAGNOSIS — K219 Gastro-esophageal reflux disease without esophagitis: Principal | ICD-10-CM

## 2023-04-11 DIAGNOSIS — E1169 Type 2 diabetes mellitus with other specified complication: Principal | ICD-10-CM

## 2023-04-11 DIAGNOSIS — I70203 Unspecified atherosclerosis of native arteries of extremities, bilateral legs: Principal | ICD-10-CM

## 2023-04-11 DIAGNOSIS — T428X1A Poisoning by antiparkinsonism drugs and other central muscle-tone depressants, accidental (unintentional), initial encounter: Principal | ICD-10-CM

## 2023-04-11 DIAGNOSIS — F319 Bipolar disorder, unspecified: Principal | ICD-10-CM

## 2023-04-11 DIAGNOSIS — L97412 Non-pressure chronic ulcer of right heel and midfoot with fat layer exposed: Principal | ICD-10-CM

## 2023-04-11 DIAGNOSIS — E890 Postprocedural hypothyroidism: Principal | ICD-10-CM

## 2023-04-11 DIAGNOSIS — G2581 Restless legs syndrome: Principal | ICD-10-CM

## 2023-04-11 DIAGNOSIS — I10 Essential (primary) hypertension: Principal | ICD-10-CM

## 2023-04-11 DIAGNOSIS — B182 Chronic viral hepatitis C: Principal | ICD-10-CM

## 2023-04-11 DIAGNOSIS — M5136 Other intervertebral disc degeneration, lumbar region: Principal | ICD-10-CM

## 2023-04-11 MED ORDER — PEN NEEDLE, DIABETIC 32 GAUGE X 5/32" (4 MM)
Freq: Three times a day (TID) | 0 refills | 67 days | Status: CP
Start: 2023-04-11 — End: 2023-07-10

## 2023-04-11 MED ORDER — QUETIAPINE 25 MG TABLET
ORAL_TABLET | ORAL | 0 refills | 56 days | Status: CP
Start: 2023-04-11 — End: 2023-06-06

## 2023-04-11 MED ORDER — INSULIN ASPART (U-100) 100 UNIT/ML (3 ML) SUBCUTANEOUS PEN
12 refills | 0 days | Status: CP
Start: 2023-04-11 — End: ?

## 2023-04-11 MED ORDER — ONDANSETRON HCL 4 MG TABLET
ORAL_TABLET | Freq: Every day | ORAL | 0 refills | 20 days | Status: CP | PRN
Start: 2023-04-11 — End: ?

## 2023-04-11 MED FILL — AMLODIPINE 10 MG TABLET: ORAL | 90 days supply | Qty: 90 | Fill #1

## 2023-04-12 DIAGNOSIS — E039 Hypothyroidism, unspecified: Principal | ICD-10-CM

## 2023-04-12 MED ORDER — LEVOTHYROXINE 75 MCG TABLET
ORAL_TABLET | Freq: Every day | ORAL | 1 refills | 90 days | Status: CP
Start: 2023-04-12 — End: ?
  Filled 2023-06-05: qty 30, 30d supply, fill #0

## 2023-04-13 ENCOUNTER — Ambulatory Visit: Admit: 2023-04-13 | Discharge: 2023-04-14 | Payer: MEDICARE

## 2023-04-13 DIAGNOSIS — I350 Nonrheumatic aortic (valve) stenosis: Principal | ICD-10-CM

## 2023-04-13 DIAGNOSIS — I240 Acute coronary thrombosis not resulting in myocardial infarction: Principal | ICD-10-CM

## 2023-04-13 DIAGNOSIS — I10 Essential (primary) hypertension: Principal | ICD-10-CM

## 2023-04-13 DIAGNOSIS — N186 End stage renal disease: Principal | ICD-10-CM

## 2023-04-13 DIAGNOSIS — I2699 Other pulmonary embolism without acute cor pulmonale: Principal | ICD-10-CM

## 2023-04-13 DIAGNOSIS — I259 Chronic ischemic heart disease, unspecified: Principal | ICD-10-CM

## 2023-04-14 DIAGNOSIS — E1151 Type 2 diabetes mellitus with diabetic peripheral angiopathy without gangrene: Principal | ICD-10-CM

## 2023-04-14 DIAGNOSIS — G2581 Restless legs syndrome: Principal | ICD-10-CM

## 2023-04-14 DIAGNOSIS — M5136 Other intervertebral disc degeneration, lumbar region: Principal | ICD-10-CM

## 2023-04-14 DIAGNOSIS — E1143 Type 2 diabetes mellitus with diabetic autonomic (poly)neuropathy: Principal | ICD-10-CM

## 2023-04-14 DIAGNOSIS — E11621 Type 2 diabetes mellitus with foot ulcer: Principal | ICD-10-CM

## 2023-04-14 DIAGNOSIS — E890 Postprocedural hypothyroidism: Principal | ICD-10-CM

## 2023-04-14 DIAGNOSIS — I5042 Chronic combined systolic (congestive) and diastolic (congestive) heart failure: Principal | ICD-10-CM

## 2023-04-14 DIAGNOSIS — I70203 Unspecified atherosclerosis of native arteries of extremities, bilateral legs: Principal | ICD-10-CM

## 2023-04-14 DIAGNOSIS — N186 End stage renal disease: Principal | ICD-10-CM

## 2023-04-14 DIAGNOSIS — F419 Anxiety disorder, unspecified: Principal | ICD-10-CM

## 2023-04-14 DIAGNOSIS — E785 Hyperlipidemia, unspecified: Principal | ICD-10-CM

## 2023-04-14 DIAGNOSIS — I2510 Atherosclerotic heart disease of native coronary artery without angina pectoris: Principal | ICD-10-CM

## 2023-04-14 DIAGNOSIS — E1169 Type 2 diabetes mellitus with other specified complication: Principal | ICD-10-CM

## 2023-04-14 DIAGNOSIS — E559 Vitamin D deficiency, unspecified: Principal | ICD-10-CM

## 2023-04-14 DIAGNOSIS — G894 Chronic pain syndrome: Principal | ICD-10-CM

## 2023-04-14 DIAGNOSIS — M86171 Other acute osteomyelitis, right ankle and foot: Principal | ICD-10-CM

## 2023-04-14 DIAGNOSIS — E1142 Type 2 diabetes mellitus with diabetic polyneuropathy: Principal | ICD-10-CM

## 2023-04-14 DIAGNOSIS — I272 Pulmonary hypertension, unspecified: Principal | ICD-10-CM

## 2023-04-14 DIAGNOSIS — B182 Chronic viral hepatitis C: Principal | ICD-10-CM

## 2023-04-14 DIAGNOSIS — K219 Gastro-esophageal reflux disease without esophagitis: Principal | ICD-10-CM

## 2023-04-14 DIAGNOSIS — D631 Anemia in chronic kidney disease: Principal | ICD-10-CM

## 2023-04-14 DIAGNOSIS — I110 Hypertensive heart disease with heart failure: Principal | ICD-10-CM

## 2023-04-14 DIAGNOSIS — L97412 Non-pressure chronic ulcer of right heel and midfoot with fat layer exposed: Principal | ICD-10-CM

## 2023-04-14 DIAGNOSIS — I151 Hypertension secondary to other renal disorders: Principal | ICD-10-CM

## 2023-04-14 DIAGNOSIS — F319 Bipolar disorder, unspecified: Principal | ICD-10-CM

## 2023-04-18 ENCOUNTER — Ambulatory Visit: Admit: 2023-04-18 | Discharge: 2023-04-19

## 2023-04-18 DIAGNOSIS — L97509 Non-pressure chronic ulcer of other part of unspecified foot with unspecified severity: Principal | ICD-10-CM

## 2023-04-18 DIAGNOSIS — E11621 Type 2 diabetes mellitus with foot ulcer: Principal | ICD-10-CM

## 2023-04-18 DIAGNOSIS — N186 End stage renal disease: Principal | ICD-10-CM

## 2023-04-18 DIAGNOSIS — Z794 Long term (current) use of insulin: Principal | ICD-10-CM

## 2023-04-18 DIAGNOSIS — Z992 Dependence on renal dialysis: Principal | ICD-10-CM

## 2023-04-18 DIAGNOSIS — E1122 Type 2 diabetes mellitus with diabetic chronic kidney disease: Principal | ICD-10-CM

## 2023-04-20 ENCOUNTER — Institutional Professional Consult (permissible substitution): Admit: 2023-04-20 | Discharge: 2023-04-21

## 2023-04-20 DIAGNOSIS — G2581 Restless legs syndrome: Principal | ICD-10-CM

## 2023-04-20 MED ORDER — FREESTYLE LIBRE 3 SENSOR DEVICE
SUBCUTANEOUS | 5 refills | 0 days | Status: CP
Start: 2023-04-20 — End: ?

## 2023-04-21 ENCOUNTER — Telehealth
Admit: 2023-04-21 | Discharge: 2023-04-22 | Payer: MEDICARE | Attending: Psychiatric/Mental Health | Primary: Psychiatric/Mental Health

## 2023-04-21 DIAGNOSIS — F331 Major depressive disorder, recurrent, moderate: Principal | ICD-10-CM

## 2023-04-21 DIAGNOSIS — N186 End stage renal disease: Principal | ICD-10-CM

## 2023-04-21 DIAGNOSIS — G4733 Obstructive sleep apnea (adult) (pediatric): Principal | ICD-10-CM

## 2023-04-21 DIAGNOSIS — G2581 Restless legs syndrome: Principal | ICD-10-CM

## 2023-04-21 DIAGNOSIS — G4700 Insomnia, unspecified: Principal | ICD-10-CM

## 2023-04-26 ENCOUNTER — Encounter: Admit: 2023-04-26 | Payer: MEDICARE

## 2023-04-26 ENCOUNTER — Encounter: Admit: 2023-04-26 | Discharge: 2023-05-25 | Payer: MEDICARE

## 2023-04-27 ENCOUNTER — Telehealth
Admit: 2023-04-27 | Discharge: 2023-04-28 | Payer: MEDICARE | Attending: Psychiatric/Mental Health | Primary: Psychiatric/Mental Health

## 2023-04-27 DIAGNOSIS — F331 Major depressive disorder, recurrent, moderate: Principal | ICD-10-CM

## 2023-05-03 ENCOUNTER — Ambulatory Visit: Admit: 2023-05-03 | Discharge: 2023-05-09 | Payer: MEDICARE

## 2023-05-03 ENCOUNTER — Ambulatory Visit: Admit: 2023-05-03 | Discharge: 2023-05-09 | Disposition: A | Discharge status: Home Health | Payer: MEDICARE

## 2023-05-06 MED ORDER — APIXABAN 5 MG TABLET
ORAL_TABLET | Freq: Two times a day (BID) | ORAL | 0 refills | 90 days
Start: 2023-05-06 — End: 2023-08-04

## 2023-05-06 MED ORDER — CALCIUM ACETATE(PHOSPHATE BINDERS) 667 MG TABLET
ORAL_TABLET | Freq: Three times a day (TID) | ORAL | 0 refills | 30 days
Start: 2023-05-06 — End: 2024-05-05

## 2023-05-08 MED ORDER — CALCIUM ACETATE(PHOSPHATE BINDERS) 667 MG TABLET
ORAL_TABLET | Freq: Three times a day (TID) | ORAL | 0 refills | 30 days
Start: 2023-05-08 — End: 2024-05-07

## 2023-05-09 MED ORDER — INSULIN GLARGINE (U-100) 100 UNIT/ML (3 ML) SUBCUTANEOUS PEN
Freq: Every evening | SUBCUTANEOUS | 0 refills | 214 days | Status: CP
Start: 2023-05-09 — End: 2023-06-08
  Filled 2023-06-05: qty 15, 140d supply, fill #0

## 2023-05-09 MED ORDER — CALCIUM ACETATE(PHOSPHATE BINDERS) 667 MG TABLET
ORAL_TABLET | Freq: Three times a day (TID) | ORAL | 0 refills | 30 days | Status: CP
Start: 2023-05-09 — End: 2023-06-08

## 2023-05-10 ENCOUNTER — Ambulatory Visit
Admit: 2023-05-10 | Discharge: 2023-05-10 | Payer: MEDICARE | Attending: Thoracic Surgery (Cardiothoracic Vascular Surgery) | Primary: Thoracic Surgery (Cardiothoracic Vascular Surgery)

## 2023-05-10 ENCOUNTER — Ambulatory Visit: Admit: 2023-05-10 | Discharge: 2023-05-10 | Payer: MEDICARE

## 2023-05-10 DIAGNOSIS — I350 Nonrheumatic aortic (valve) stenosis: Principal | ICD-10-CM

## 2023-05-10 DIAGNOSIS — G2581 Restless legs syndrome: Principal | ICD-10-CM

## 2023-05-10 DIAGNOSIS — K219 Gastro-esophageal reflux disease without esophagitis: Principal | ICD-10-CM

## 2023-05-10 MED ORDER — PANTOPRAZOLE 20 MG TABLET,DELAYED RELEASE
ORAL_TABLET | Freq: Every day | ORAL | 1 refills | 30 days
Start: 2023-05-10 — End: 2024-05-09

## 2023-05-10 MED ORDER — VELPHORO 500 MG CHEWABLE TABLET
Freq: Three times a day (TID) | ORAL | 0 refills | 0 days
Start: 2023-05-10 — End: ?

## 2023-05-11 DIAGNOSIS — E1169 Type 2 diabetes mellitus with other specified complication: Principal | ICD-10-CM

## 2023-05-11 DIAGNOSIS — I272 Pulmonary hypertension, unspecified: Principal | ICD-10-CM

## 2023-05-11 DIAGNOSIS — E1143 Type 2 diabetes mellitus with diabetic autonomic (poly)neuropathy: Principal | ICD-10-CM

## 2023-05-11 DIAGNOSIS — E559 Vitamin D deficiency, unspecified: Principal | ICD-10-CM

## 2023-05-11 DIAGNOSIS — F319 Bipolar disorder, unspecified: Principal | ICD-10-CM

## 2023-05-11 DIAGNOSIS — E785 Hyperlipidemia, unspecified: Principal | ICD-10-CM

## 2023-05-11 DIAGNOSIS — I151 Hypertension secondary to other renal disorders: Principal | ICD-10-CM

## 2023-05-11 DIAGNOSIS — E1142 Type 2 diabetes mellitus with diabetic polyneuropathy: Principal | ICD-10-CM

## 2023-05-11 DIAGNOSIS — N186 End stage renal disease: Principal | ICD-10-CM

## 2023-05-11 DIAGNOSIS — I110 Hypertensive heart disease with heart failure: Principal | ICD-10-CM

## 2023-05-11 DIAGNOSIS — E1151 Type 2 diabetes mellitus with diabetic peripheral angiopathy without gangrene: Principal | ICD-10-CM

## 2023-05-11 DIAGNOSIS — M86171 Other acute osteomyelitis, right ankle and foot: Principal | ICD-10-CM

## 2023-05-11 DIAGNOSIS — E11621 Type 2 diabetes mellitus with foot ulcer: Principal | ICD-10-CM

## 2023-05-11 DIAGNOSIS — G928 Other toxic encephalopathy: Principal | ICD-10-CM

## 2023-05-11 DIAGNOSIS — I5042 Chronic combined systolic (congestive) and diastolic (congestive) heart failure: Principal | ICD-10-CM

## 2023-05-11 DIAGNOSIS — G894 Chronic pain syndrome: Principal | ICD-10-CM

## 2023-05-11 DIAGNOSIS — I70203 Unspecified atherosclerosis of native arteries of extremities, bilateral legs: Principal | ICD-10-CM

## 2023-05-11 DIAGNOSIS — F419 Anxiety disorder, unspecified: Principal | ICD-10-CM

## 2023-05-11 DIAGNOSIS — I2510 Atherosclerotic heart disease of native coronary artery without angina pectoris: Principal | ICD-10-CM

## 2023-05-11 DIAGNOSIS — L97412 Non-pressure chronic ulcer of right heel and midfoot with fat layer exposed: Principal | ICD-10-CM

## 2023-05-11 DIAGNOSIS — I350 Nonrheumatic aortic (valve) stenosis: Principal | ICD-10-CM

## 2023-05-11 DIAGNOSIS — T428X1D Poisoning by antiparkinsonism drugs and other central muscle-tone depressants, accidental (unintentional), subsequent encounter: Principal | ICD-10-CM

## 2023-05-11 DIAGNOSIS — G2581 Restless legs syndrome: Principal | ICD-10-CM

## 2023-05-11 DIAGNOSIS — D631 Anemia in chronic kidney disease: Principal | ICD-10-CM

## 2023-05-11 DIAGNOSIS — E890 Postprocedural hypothyroidism: Principal | ICD-10-CM

## 2023-05-11 DIAGNOSIS — B182 Chronic viral hepatitis C: Principal | ICD-10-CM

## 2023-05-11 MED ORDER — VELPHORO 500 MG CHEWABLE TABLET
ORAL_TABLET | Freq: Three times a day (TID) | ORAL | 3 refills | 0 days | Status: CP
Start: 2023-05-11 — End: ?

## 2023-05-11 MED ORDER — PANTOPRAZOLE 20 MG TABLET,DELAYED RELEASE
ORAL_TABLET | Freq: Every day | ORAL | 1 refills | 90 days | Status: CP
Start: 2023-05-11 — End: 2024-05-10

## 2023-05-12 DIAGNOSIS — F319 Bipolar disorder, unspecified: Principal | ICD-10-CM

## 2023-05-12 DIAGNOSIS — E1143 Type 2 diabetes mellitus with diabetic autonomic (poly)neuropathy: Principal | ICD-10-CM

## 2023-05-12 DIAGNOSIS — E1142 Type 2 diabetes mellitus with diabetic polyneuropathy: Principal | ICD-10-CM

## 2023-05-12 DIAGNOSIS — M86171 Other acute osteomyelitis, right ankle and foot: Principal | ICD-10-CM

## 2023-05-12 DIAGNOSIS — E559 Vitamin D deficiency, unspecified: Principal | ICD-10-CM

## 2023-05-12 DIAGNOSIS — E11621 Type 2 diabetes mellitus with foot ulcer: Principal | ICD-10-CM

## 2023-05-12 DIAGNOSIS — F419 Anxiety disorder, unspecified: Principal | ICD-10-CM

## 2023-05-12 DIAGNOSIS — I2510 Atherosclerotic heart disease of native coronary artery without angina pectoris: Principal | ICD-10-CM

## 2023-05-12 DIAGNOSIS — E1169 Type 2 diabetes mellitus with other specified complication: Principal | ICD-10-CM

## 2023-05-12 DIAGNOSIS — G2581 Restless legs syndrome: Principal | ICD-10-CM

## 2023-05-12 DIAGNOSIS — B182 Chronic viral hepatitis C: Principal | ICD-10-CM

## 2023-05-12 DIAGNOSIS — G894 Chronic pain syndrome: Principal | ICD-10-CM

## 2023-05-12 DIAGNOSIS — I70203 Unspecified atherosclerosis of native arteries of extremities, bilateral legs: Principal | ICD-10-CM

## 2023-05-12 DIAGNOSIS — D631 Anemia in chronic kidney disease: Principal | ICD-10-CM

## 2023-05-12 DIAGNOSIS — I272 Pulmonary hypertension, unspecified: Principal | ICD-10-CM

## 2023-05-12 DIAGNOSIS — N186 End stage renal disease: Principal | ICD-10-CM

## 2023-05-12 DIAGNOSIS — E890 Postprocedural hypothyroidism: Principal | ICD-10-CM

## 2023-05-12 DIAGNOSIS — I151 Hypertension secondary to other renal disorders: Principal | ICD-10-CM

## 2023-05-12 DIAGNOSIS — L97412 Non-pressure chronic ulcer of right heel and midfoot with fat layer exposed: Principal | ICD-10-CM

## 2023-05-12 DIAGNOSIS — I5042 Chronic combined systolic (congestive) and diastolic (congestive) heart failure: Principal | ICD-10-CM

## 2023-05-12 DIAGNOSIS — E785 Hyperlipidemia, unspecified: Principal | ICD-10-CM

## 2023-05-12 DIAGNOSIS — T428X1D Poisoning by antiparkinsonism drugs and other central muscle-tone depressants, accidental (unintentional), subsequent encounter: Principal | ICD-10-CM

## 2023-05-12 DIAGNOSIS — E1151 Type 2 diabetes mellitus with diabetic peripheral angiopathy without gangrene: Principal | ICD-10-CM

## 2023-05-12 DIAGNOSIS — I110 Hypertensive heart disease with heart failure: Principal | ICD-10-CM

## 2023-05-12 DIAGNOSIS — G928 Other toxic encephalopathy: Principal | ICD-10-CM

## 2023-05-12 MED FILL — ATORVASTATIN 80 MG TABLET: ORAL | 90 days supply | Qty: 90 | Fill #1

## 2023-05-16 ENCOUNTER — Institutional Professional Consult (permissible substitution): Admit: 2023-05-16 | Discharge: 2023-05-17

## 2023-05-16 DIAGNOSIS — L97412 Non-pressure chronic ulcer of right heel and midfoot with fat layer exposed: Principal | ICD-10-CM

## 2023-05-16 DIAGNOSIS — I70203 Unspecified atherosclerosis of native arteries of extremities, bilateral legs: Principal | ICD-10-CM

## 2023-05-16 DIAGNOSIS — F419 Anxiety disorder, unspecified: Principal | ICD-10-CM

## 2023-05-16 DIAGNOSIS — E559 Vitamin D deficiency, unspecified: Principal | ICD-10-CM

## 2023-05-16 DIAGNOSIS — I272 Pulmonary hypertension, unspecified: Principal | ICD-10-CM

## 2023-05-16 DIAGNOSIS — E1143 Type 2 diabetes mellitus with diabetic autonomic (poly)neuropathy: Principal | ICD-10-CM

## 2023-05-16 DIAGNOSIS — I110 Hypertensive heart disease with heart failure: Principal | ICD-10-CM

## 2023-05-16 DIAGNOSIS — E1142 Type 2 diabetes mellitus with diabetic polyneuropathy: Principal | ICD-10-CM

## 2023-05-16 DIAGNOSIS — E1169 Type 2 diabetes mellitus with other specified complication: Principal | ICD-10-CM

## 2023-05-16 DIAGNOSIS — E890 Postprocedural hypothyroidism: Principal | ICD-10-CM

## 2023-05-16 DIAGNOSIS — D631 Anemia in chronic kidney disease: Principal | ICD-10-CM

## 2023-05-16 DIAGNOSIS — E1151 Type 2 diabetes mellitus with diabetic peripheral angiopathy without gangrene: Principal | ICD-10-CM

## 2023-05-16 DIAGNOSIS — M86171 Other acute osteomyelitis, right ankle and foot: Principal | ICD-10-CM

## 2023-05-16 DIAGNOSIS — B182 Chronic viral hepatitis C: Principal | ICD-10-CM

## 2023-05-16 DIAGNOSIS — E785 Hyperlipidemia, unspecified: Principal | ICD-10-CM

## 2023-05-16 DIAGNOSIS — G928 Other toxic encephalopathy: Principal | ICD-10-CM

## 2023-05-16 DIAGNOSIS — I5042 Chronic combined systolic (congestive) and diastolic (congestive) heart failure: Principal | ICD-10-CM

## 2023-05-16 DIAGNOSIS — I151 Hypertension secondary to other renal disorders: Principal | ICD-10-CM

## 2023-05-16 DIAGNOSIS — N186 End stage renal disease: Principal | ICD-10-CM

## 2023-05-16 DIAGNOSIS — F319 Bipolar disorder, unspecified: Principal | ICD-10-CM

## 2023-05-16 DIAGNOSIS — G2581 Restless legs syndrome: Principal | ICD-10-CM

## 2023-05-16 DIAGNOSIS — T428X1D Poisoning by antiparkinsonism drugs and other central muscle-tone depressants, accidental (unintentional), subsequent encounter: Principal | ICD-10-CM

## 2023-05-16 DIAGNOSIS — G894 Chronic pain syndrome: Principal | ICD-10-CM

## 2023-05-16 DIAGNOSIS — E11621 Type 2 diabetes mellitus with foot ulcer: Principal | ICD-10-CM

## 2023-05-16 DIAGNOSIS — I2510 Atherosclerotic heart disease of native coronary artery without angina pectoris: Principal | ICD-10-CM

## 2023-05-18 DIAGNOSIS — E785 Hyperlipidemia, unspecified: Principal | ICD-10-CM

## 2023-05-18 DIAGNOSIS — E1143 Type 2 diabetes mellitus with diabetic autonomic (poly)neuropathy: Principal | ICD-10-CM

## 2023-05-18 DIAGNOSIS — N186 End stage renal disease: Principal | ICD-10-CM

## 2023-05-18 DIAGNOSIS — G2581 Restless legs syndrome: Principal | ICD-10-CM

## 2023-05-18 DIAGNOSIS — E11621 Type 2 diabetes mellitus with foot ulcer: Principal | ICD-10-CM

## 2023-05-18 DIAGNOSIS — M86171 Other acute osteomyelitis, right ankle and foot: Principal | ICD-10-CM

## 2023-05-18 DIAGNOSIS — T428X1D Poisoning by antiparkinsonism drugs and other central muscle-tone depressants, accidental (unintentional), subsequent encounter: Principal | ICD-10-CM

## 2023-05-18 DIAGNOSIS — G928 Other toxic encephalopathy: Principal | ICD-10-CM

## 2023-05-18 DIAGNOSIS — I151 Hypertension secondary to other renal disorders: Principal | ICD-10-CM

## 2023-05-18 DIAGNOSIS — E1142 Type 2 diabetes mellitus with diabetic polyneuropathy: Principal | ICD-10-CM

## 2023-05-18 DIAGNOSIS — I70203 Unspecified atherosclerosis of native arteries of extremities, bilateral legs: Principal | ICD-10-CM

## 2023-05-18 DIAGNOSIS — I110 Hypertensive heart disease with heart failure: Principal | ICD-10-CM

## 2023-05-18 DIAGNOSIS — E1151 Type 2 diabetes mellitus with diabetic peripheral angiopathy without gangrene: Principal | ICD-10-CM

## 2023-05-18 DIAGNOSIS — E890 Postprocedural hypothyroidism: Principal | ICD-10-CM

## 2023-05-18 DIAGNOSIS — G894 Chronic pain syndrome: Principal | ICD-10-CM

## 2023-05-18 DIAGNOSIS — E1169 Type 2 diabetes mellitus with other specified complication: Principal | ICD-10-CM

## 2023-05-18 DIAGNOSIS — L97412 Non-pressure chronic ulcer of right heel and midfoot with fat layer exposed: Principal | ICD-10-CM

## 2023-05-18 DIAGNOSIS — F319 Bipolar disorder, unspecified: Principal | ICD-10-CM

## 2023-05-18 DIAGNOSIS — B182 Chronic viral hepatitis C: Principal | ICD-10-CM

## 2023-05-18 DIAGNOSIS — I272 Pulmonary hypertension, unspecified: Principal | ICD-10-CM

## 2023-05-18 DIAGNOSIS — E559 Vitamin D deficiency, unspecified: Principal | ICD-10-CM

## 2023-05-18 DIAGNOSIS — I5042 Chronic combined systolic (congestive) and diastolic (congestive) heart failure: Principal | ICD-10-CM

## 2023-05-18 DIAGNOSIS — F419 Anxiety disorder, unspecified: Principal | ICD-10-CM

## 2023-05-18 DIAGNOSIS — D631 Anemia in chronic kidney disease: Principal | ICD-10-CM

## 2023-05-18 DIAGNOSIS — I2510 Atherosclerotic heart disease of native coronary artery without angina pectoris: Principal | ICD-10-CM

## 2023-05-18 MED ORDER — GABAPENTIN 300 MG CAPSULE
ORAL_CAPSULE | Freq: Every evening | ORAL | 1 refills | 90 days | Status: CP
Start: 2023-05-18 — End: 2024-05-17
  Filled 2023-05-29: qty 90, 90d supply, fill #0

## 2023-05-23 DIAGNOSIS — G928 Other toxic encephalopathy: Principal | ICD-10-CM

## 2023-05-23 DIAGNOSIS — E1143 Type 2 diabetes mellitus with diabetic autonomic (poly)neuropathy: Principal | ICD-10-CM

## 2023-05-23 DIAGNOSIS — I2510 Atherosclerotic heart disease of native coronary artery without angina pectoris: Principal | ICD-10-CM

## 2023-05-23 DIAGNOSIS — B182 Chronic viral hepatitis C: Principal | ICD-10-CM

## 2023-05-23 DIAGNOSIS — E890 Postprocedural hypothyroidism: Principal | ICD-10-CM

## 2023-05-23 DIAGNOSIS — E559 Vitamin D deficiency, unspecified: Principal | ICD-10-CM

## 2023-05-23 DIAGNOSIS — I110 Hypertensive heart disease with heart failure: Principal | ICD-10-CM

## 2023-05-23 DIAGNOSIS — E785 Hyperlipidemia, unspecified: Principal | ICD-10-CM

## 2023-05-23 DIAGNOSIS — I151 Hypertension secondary to other renal disorders: Principal | ICD-10-CM

## 2023-05-23 DIAGNOSIS — E11621 Type 2 diabetes mellitus with foot ulcer: Principal | ICD-10-CM

## 2023-05-23 DIAGNOSIS — I5042 Chronic combined systolic (congestive) and diastolic (congestive) heart failure: Principal | ICD-10-CM

## 2023-05-23 DIAGNOSIS — E1142 Type 2 diabetes mellitus with diabetic polyneuropathy: Principal | ICD-10-CM

## 2023-05-23 DIAGNOSIS — F419 Anxiety disorder, unspecified: Principal | ICD-10-CM

## 2023-05-23 DIAGNOSIS — D631 Anemia in chronic kidney disease: Principal | ICD-10-CM

## 2023-05-23 DIAGNOSIS — N186 End stage renal disease: Principal | ICD-10-CM

## 2023-05-23 DIAGNOSIS — L97412 Non-pressure chronic ulcer of right heel and midfoot with fat layer exposed: Principal | ICD-10-CM

## 2023-05-23 DIAGNOSIS — I272 Pulmonary hypertension, unspecified: Principal | ICD-10-CM

## 2023-05-23 DIAGNOSIS — E1169 Type 2 diabetes mellitus with other specified complication: Principal | ICD-10-CM

## 2023-05-23 DIAGNOSIS — E1151 Type 2 diabetes mellitus with diabetic peripheral angiopathy without gangrene: Principal | ICD-10-CM

## 2023-05-23 DIAGNOSIS — G2581 Restless legs syndrome: Principal | ICD-10-CM

## 2023-05-23 DIAGNOSIS — F319 Bipolar disorder, unspecified: Principal | ICD-10-CM

## 2023-05-23 DIAGNOSIS — G894 Chronic pain syndrome: Principal | ICD-10-CM

## 2023-05-23 DIAGNOSIS — I70203 Unspecified atherosclerosis of native arteries of extremities, bilateral legs: Principal | ICD-10-CM

## 2023-05-23 DIAGNOSIS — T428X1D Poisoning by antiparkinsonism drugs and other central muscle-tone depressants, accidental (unintentional), subsequent encounter: Principal | ICD-10-CM

## 2023-05-23 DIAGNOSIS — M86171 Other acute osteomyelitis, right ankle and foot: Principal | ICD-10-CM

## 2023-05-24 DIAGNOSIS — L03115 Cellulitis of right lower limb: Principal | ICD-10-CM

## 2023-05-25 ENCOUNTER — Ambulatory Visit: Admit: 2023-05-25 | Discharge: 2023-05-25 | Payer: MEDICARE

## 2023-05-25 ENCOUNTER — Ambulatory Visit: Admit: 2023-05-25 | Discharge: 2023-05-25 | Payer: MEDICARE | Attending: Family | Primary: Family

## 2023-05-25 ENCOUNTER — Ambulatory Visit: Admit: 2023-05-25 | Discharge: 2023-05-25 | Payer: MEDICARE | Attending: Podiatrist | Primary: Podiatrist

## 2023-05-25 DIAGNOSIS — L97412 Non-pressure chronic ulcer of right heel and midfoot with fat layer exposed: Principal | ICD-10-CM

## 2023-05-25 DIAGNOSIS — E1143 Type 2 diabetes mellitus with diabetic autonomic (poly)neuropathy: Principal | ICD-10-CM

## 2023-05-25 DIAGNOSIS — I739 Peripheral vascular disease, unspecified: Principal | ICD-10-CM

## 2023-05-25 DIAGNOSIS — L97413 Non-pressure chronic ulcer of right heel and midfoot with necrosis of muscle: Principal | ICD-10-CM

## 2023-05-25 DIAGNOSIS — E11621 Type 2 diabetes mellitus with foot ulcer: Principal | ICD-10-CM

## 2023-05-25 DIAGNOSIS — E785 Hyperlipidemia, unspecified: Principal | ICD-10-CM

## 2023-05-25 DIAGNOSIS — I5042 Chronic combined systolic (congestive) and diastolic (congestive) heart failure: Principal | ICD-10-CM

## 2023-05-25 DIAGNOSIS — D631 Anemia in chronic kidney disease: Principal | ICD-10-CM

## 2023-05-25 DIAGNOSIS — T428X1D Poisoning by antiparkinsonism drugs and other central muscle-tone depressants, accidental (unintentional), subsequent encounter: Principal | ICD-10-CM

## 2023-05-25 DIAGNOSIS — G2581 Restless legs syndrome: Principal | ICD-10-CM

## 2023-05-25 DIAGNOSIS — E1142 Type 2 diabetes mellitus with diabetic polyneuropathy: Principal | ICD-10-CM

## 2023-05-25 DIAGNOSIS — I151 Hypertension secondary to other renal disorders: Principal | ICD-10-CM

## 2023-05-25 DIAGNOSIS — B182 Chronic viral hepatitis C: Principal | ICD-10-CM

## 2023-05-25 DIAGNOSIS — E890 Postprocedural hypothyroidism: Principal | ICD-10-CM

## 2023-05-25 DIAGNOSIS — N186 End stage renal disease: Principal | ICD-10-CM

## 2023-05-25 DIAGNOSIS — R600 Localized edema: Principal | ICD-10-CM

## 2023-05-25 DIAGNOSIS — E1151 Type 2 diabetes mellitus with diabetic peripheral angiopathy without gangrene: Principal | ICD-10-CM

## 2023-05-25 DIAGNOSIS — I2510 Atherosclerotic heart disease of native coronary artery without angina pectoris: Principal | ICD-10-CM

## 2023-05-25 DIAGNOSIS — G928 Other toxic encephalopathy: Principal | ICD-10-CM

## 2023-05-25 DIAGNOSIS — G894 Chronic pain syndrome: Principal | ICD-10-CM

## 2023-05-25 DIAGNOSIS — I70203 Unspecified atherosclerosis of native arteries of extremities, bilateral legs: Principal | ICD-10-CM

## 2023-05-25 DIAGNOSIS — L97509 Non-pressure chronic ulcer of other part of unspecified foot with unspecified severity: Principal | ICD-10-CM

## 2023-05-25 DIAGNOSIS — E559 Vitamin D deficiency, unspecified: Principal | ICD-10-CM

## 2023-05-25 DIAGNOSIS — Z794 Long term (current) use of insulin: Principal | ICD-10-CM

## 2023-05-25 DIAGNOSIS — F419 Anxiety disorder, unspecified: Principal | ICD-10-CM

## 2023-05-25 DIAGNOSIS — I2699 Other pulmonary embolism without acute cor pulmonale: Principal | ICD-10-CM

## 2023-05-25 DIAGNOSIS — I272 Pulmonary hypertension, unspecified: Principal | ICD-10-CM

## 2023-05-25 DIAGNOSIS — L03115 Cellulitis of right lower limb: Principal | ICD-10-CM

## 2023-05-25 DIAGNOSIS — F319 Bipolar disorder, unspecified: Principal | ICD-10-CM

## 2023-05-25 DIAGNOSIS — M86171 Other acute osteomyelitis, right ankle and foot: Principal | ICD-10-CM

## 2023-05-25 DIAGNOSIS — I110 Hypertensive heart disease with heart failure: Principal | ICD-10-CM

## 2023-05-25 DIAGNOSIS — E1169 Type 2 diabetes mellitus with other specified complication: Principal | ICD-10-CM

## 2023-05-26 ENCOUNTER — Encounter: Admit: 2023-05-26 | Payer: MEDICARE

## 2023-05-26 ENCOUNTER — Encounter: Admit: 2023-05-26 | Discharge: 2023-06-24 | Payer: MEDICARE

## 2023-05-26 ENCOUNTER — Ambulatory Visit: Admit: 2023-05-26 | Discharge: 2023-06-24 | Payer: MEDICARE

## 2023-05-30 ENCOUNTER — Ambulatory Visit: Admit: 2023-05-30 | Discharge: 2023-05-30 | Payer: MEDICARE

## 2023-05-30 MED ORDER — CLOPIDOGREL 75 MG TABLET
ORAL_TABLET | Freq: Every day | ORAL | 0 refills | 30 days | Status: CP
Start: 2023-05-30 — End: 2023-06-29
  Filled 2023-05-30: qty 30, 30d supply, fill #0

## 2023-05-31 ENCOUNTER — Ambulatory Visit: Admit: 2023-05-31 | Discharge: 2023-06-05 | Payer: MEDICARE

## 2023-05-31 ENCOUNTER — Ambulatory Visit
Admit: 2023-05-31 | Discharge: 2023-06-05 | Disposition: A | Discharge status: Home Health | Payer: MEDICARE | Admitting: Internal Medicine

## 2023-05-31 ENCOUNTER — Encounter
Admit: 2023-05-31 | Discharge: 2023-06-05 | Disposition: A | Discharge status: Home Health | Payer: MEDICARE | Attending: Internal Medicine | Admitting: Internal Medicine

## 2023-05-31 ENCOUNTER — Encounter
Admit: 2023-05-31 | Discharge: 2023-06-05 | Disposition: A | Discharge status: Home Health | Payer: MEDICARE | Admitting: Internal Medicine

## 2023-05-31 ENCOUNTER — Encounter: Admit: 2023-05-31 | Discharge: 2023-06-05 | Payer: MEDICARE

## 2023-05-31 ENCOUNTER — Encounter: Admit: 2023-05-31 | Discharge: 2023-06-05 | Payer: MEDICARE | Attending: Emergency Medicine

## 2023-06-01 DIAGNOSIS — I110 Hypertensive heart disease with heart failure: Principal | ICD-10-CM

## 2023-06-01 DIAGNOSIS — D631 Anemia in chronic kidney disease: Principal | ICD-10-CM

## 2023-06-01 DIAGNOSIS — I151 Hypertension secondary to other renal disorders: Principal | ICD-10-CM

## 2023-06-01 DIAGNOSIS — E1169 Type 2 diabetes mellitus with other specified complication: Principal | ICD-10-CM

## 2023-06-01 DIAGNOSIS — I2510 Atherosclerotic heart disease of native coronary artery without angina pectoris: Principal | ICD-10-CM

## 2023-06-01 DIAGNOSIS — B182 Chronic viral hepatitis C: Principal | ICD-10-CM

## 2023-06-01 DIAGNOSIS — N186 End stage renal disease: Principal | ICD-10-CM

## 2023-06-01 DIAGNOSIS — E1143 Type 2 diabetes mellitus with diabetic autonomic (poly)neuropathy: Principal | ICD-10-CM

## 2023-06-01 DIAGNOSIS — E1142 Type 2 diabetes mellitus with diabetic polyneuropathy: Principal | ICD-10-CM

## 2023-06-01 DIAGNOSIS — F319 Bipolar disorder, unspecified: Principal | ICD-10-CM

## 2023-06-01 DIAGNOSIS — L97412 Non-pressure chronic ulcer of right heel and midfoot with fat layer exposed: Principal | ICD-10-CM

## 2023-06-01 DIAGNOSIS — I272 Pulmonary hypertension, unspecified: Principal | ICD-10-CM

## 2023-06-01 DIAGNOSIS — E890 Postprocedural hypothyroidism: Principal | ICD-10-CM

## 2023-06-01 DIAGNOSIS — E11621 Type 2 diabetes mellitus with foot ulcer: Principal | ICD-10-CM

## 2023-06-01 DIAGNOSIS — T428X1D Poisoning by antiparkinsonism drugs and other central muscle-tone depressants, accidental (unintentional), subsequent encounter: Principal | ICD-10-CM

## 2023-06-01 DIAGNOSIS — G928 Other toxic encephalopathy: Principal | ICD-10-CM

## 2023-06-01 DIAGNOSIS — I5042 Chronic combined systolic (congestive) and diastolic (congestive) heart failure: Principal | ICD-10-CM

## 2023-06-01 DIAGNOSIS — E1151 Type 2 diabetes mellitus with diabetic peripheral angiopathy without gangrene: Principal | ICD-10-CM

## 2023-06-01 DIAGNOSIS — E559 Vitamin D deficiency, unspecified: Principal | ICD-10-CM

## 2023-06-01 DIAGNOSIS — G894 Chronic pain syndrome: Principal | ICD-10-CM

## 2023-06-01 DIAGNOSIS — E785 Hyperlipidemia, unspecified: Principal | ICD-10-CM

## 2023-06-01 DIAGNOSIS — E871 Hypo-osmolality and hyponatremia: Principal | ICD-10-CM

## 2023-06-01 DIAGNOSIS — M86171 Other acute osteomyelitis, right ankle and foot: Principal | ICD-10-CM

## 2023-06-01 DIAGNOSIS — G2581 Restless legs syndrome: Principal | ICD-10-CM

## 2023-06-01 DIAGNOSIS — I70203 Unspecified atherosclerosis of native arteries of extremities, bilateral legs: Principal | ICD-10-CM

## 2023-06-01 MED ORDER — LAMOTRIGINE 150 MG TABLET
ORAL_TABLET | Freq: Every day | ORAL | 0 refills | 30 days
Start: 2023-06-01 — End: 2023-07-01

## 2023-06-01 MED ORDER — SERTRALINE 50 MG TABLET
ORAL_TABLET | Freq: Every day | ORAL | 0 refills | 30 days
Start: 2023-06-01 — End: 2023-07-01

## 2023-06-01 MED ORDER — JARDIANCE 10 MG TABLET
ORAL_TABLET | Freq: Every day | ORAL | 0 refills | 30 days
Start: 2023-06-01 — End: 2023-06-01

## 2023-06-05 DIAGNOSIS — E11621 Type 2 diabetes mellitus with foot ulcer: Principal | ICD-10-CM

## 2023-06-05 DIAGNOSIS — I151 Hypertension secondary to other renal disorders: Principal | ICD-10-CM

## 2023-06-05 DIAGNOSIS — E1142 Type 2 diabetes mellitus with diabetic polyneuropathy: Principal | ICD-10-CM

## 2023-06-05 DIAGNOSIS — G928 Other toxic encephalopathy: Principal | ICD-10-CM

## 2023-06-05 DIAGNOSIS — M86171 Other acute osteomyelitis, right ankle and foot: Principal | ICD-10-CM

## 2023-06-05 DIAGNOSIS — I272 Pulmonary hypertension, unspecified: Principal | ICD-10-CM

## 2023-06-05 DIAGNOSIS — E871 Hypo-osmolality and hyponatremia: Principal | ICD-10-CM

## 2023-06-05 DIAGNOSIS — E1151 Type 2 diabetes mellitus with diabetic peripheral angiopathy without gangrene: Principal | ICD-10-CM

## 2023-06-05 DIAGNOSIS — B182 Chronic viral hepatitis C: Principal | ICD-10-CM

## 2023-06-05 DIAGNOSIS — N186 End stage renal disease: Principal | ICD-10-CM

## 2023-06-05 DIAGNOSIS — I2510 Atherosclerotic heart disease of native coronary artery without angina pectoris: Principal | ICD-10-CM

## 2023-06-05 DIAGNOSIS — E1143 Type 2 diabetes mellitus with diabetic autonomic (poly)neuropathy: Principal | ICD-10-CM

## 2023-06-05 DIAGNOSIS — T428X1D Poisoning by antiparkinsonism drugs and other central muscle-tone depressants, accidental (unintentional), subsequent encounter: Principal | ICD-10-CM

## 2023-06-05 DIAGNOSIS — E1169 Type 2 diabetes mellitus with other specified complication: Principal | ICD-10-CM

## 2023-06-05 DIAGNOSIS — I70203 Unspecified atherosclerosis of native arteries of extremities, bilateral legs: Principal | ICD-10-CM

## 2023-06-05 DIAGNOSIS — L97412 Non-pressure chronic ulcer of right heel and midfoot with fat layer exposed: Principal | ICD-10-CM

## 2023-06-05 DIAGNOSIS — I5042 Chronic combined systolic (congestive) and diastolic (congestive) heart failure: Principal | ICD-10-CM

## 2023-06-05 DIAGNOSIS — E559 Vitamin D deficiency, unspecified: Principal | ICD-10-CM

## 2023-06-05 DIAGNOSIS — E890 Postprocedural hypothyroidism: Principal | ICD-10-CM

## 2023-06-05 DIAGNOSIS — F319 Bipolar disorder, unspecified: Principal | ICD-10-CM

## 2023-06-05 DIAGNOSIS — G2581 Restless legs syndrome: Principal | ICD-10-CM

## 2023-06-05 DIAGNOSIS — I110 Hypertensive heart disease with heart failure: Principal | ICD-10-CM

## 2023-06-05 DIAGNOSIS — E785 Hyperlipidemia, unspecified: Principal | ICD-10-CM

## 2023-06-05 DIAGNOSIS — D631 Anemia in chronic kidney disease: Principal | ICD-10-CM

## 2023-06-05 DIAGNOSIS — G894 Chronic pain syndrome: Principal | ICD-10-CM

## 2023-06-05 MED ORDER — PANTOPRAZOLE 2MG/ML SUSPENSION
Freq: Every day | ORAL | 2 refills | 30.00000 days | Status: CP
Start: 2023-06-05 — End: 2023-06-05

## 2023-06-05 MED ORDER — LAMOTRIGINE 150 MG TABLET
ORAL_TABLET | Freq: Every day | ORAL | 0 refills | 30 days | Status: CP
Start: 2023-06-05 — End: 2023-07-05
  Filled 2023-06-05: qty 30, 30d supply, fill #0

## 2023-06-05 MED ORDER — OMEPRAZOLE 20 MG CAPSULE,DELAYED RELEASE
ORAL_CAPSULE | Freq: Every day | ORAL | 2 refills | 30 days | Status: CP
Start: 2023-06-05 — End: ?
  Filled 2023-06-05: qty 30, 30d supply, fill #0

## 2023-06-05 MED ORDER — TICAGRELOR 90 MG TABLET
ORAL_TABLET | Freq: Two times a day (BID) | ORAL | 2 refills | 30 days | Status: CP
Start: 2023-06-05 — End: ?
  Filled 2023-06-05: qty 60, 30d supply, fill #0

## 2023-06-05 MED ORDER — LEVOTHYROXINE 88 MCG TABLET
ORAL_TABLET | Freq: Every day | ORAL | 0 refills | 30.00000 days | Status: CN
Start: 2023-06-05 — End: 2023-07-05

## 2023-06-05 MED ORDER — SERTRALINE 50 MG TABLET
ORAL_TABLET | Freq: Every day | ORAL | 0 refills | 30 days | Status: CP
Start: 2023-06-05 — End: 2023-07-05
  Filled 2023-06-05: qty 90, 30d supply, fill #0

## 2023-06-06 DIAGNOSIS — T428X1D Poisoning by antiparkinsonism drugs and other central muscle-tone depressants, accidental (unintentional), subsequent encounter: Principal | ICD-10-CM

## 2023-06-06 DIAGNOSIS — B182 Chronic viral hepatitis C: Principal | ICD-10-CM

## 2023-06-06 DIAGNOSIS — I151 Hypertension secondary to other renal disorders: Principal | ICD-10-CM

## 2023-06-06 DIAGNOSIS — G894 Chronic pain syndrome: Principal | ICD-10-CM

## 2023-06-06 DIAGNOSIS — E11621 Type 2 diabetes mellitus with foot ulcer: Principal | ICD-10-CM

## 2023-06-06 DIAGNOSIS — E1169 Type 2 diabetes mellitus with other specified complication: Principal | ICD-10-CM

## 2023-06-06 DIAGNOSIS — G928 Other toxic encephalopathy: Principal | ICD-10-CM

## 2023-06-06 DIAGNOSIS — E1142 Type 2 diabetes mellitus with diabetic polyneuropathy: Principal | ICD-10-CM

## 2023-06-06 DIAGNOSIS — I5042 Chronic combined systolic (congestive) and diastolic (congestive) heart failure: Principal | ICD-10-CM

## 2023-06-06 DIAGNOSIS — E871 Hypo-osmolality and hyponatremia: Principal | ICD-10-CM

## 2023-06-06 DIAGNOSIS — M86171 Other acute osteomyelitis, right ankle and foot: Principal | ICD-10-CM

## 2023-06-06 DIAGNOSIS — I272 Pulmonary hypertension, unspecified: Principal | ICD-10-CM

## 2023-06-06 DIAGNOSIS — F319 Bipolar disorder, unspecified: Principal | ICD-10-CM

## 2023-06-06 DIAGNOSIS — E890 Postprocedural hypothyroidism: Principal | ICD-10-CM

## 2023-06-06 DIAGNOSIS — L97412 Non-pressure chronic ulcer of right heel and midfoot with fat layer exposed: Principal | ICD-10-CM

## 2023-06-06 DIAGNOSIS — I110 Hypertensive heart disease with heart failure: Principal | ICD-10-CM

## 2023-06-06 DIAGNOSIS — E1143 Type 2 diabetes mellitus with diabetic autonomic (poly)neuropathy: Principal | ICD-10-CM

## 2023-06-06 DIAGNOSIS — E559 Vitamin D deficiency, unspecified: Principal | ICD-10-CM

## 2023-06-06 DIAGNOSIS — E785 Hyperlipidemia, unspecified: Principal | ICD-10-CM

## 2023-06-06 DIAGNOSIS — I2510 Atherosclerotic heart disease of native coronary artery without angina pectoris: Principal | ICD-10-CM

## 2023-06-06 DIAGNOSIS — G2581 Restless legs syndrome: Principal | ICD-10-CM

## 2023-06-06 DIAGNOSIS — E1151 Type 2 diabetes mellitus with diabetic peripheral angiopathy without gangrene: Principal | ICD-10-CM

## 2023-06-06 DIAGNOSIS — I70203 Unspecified atherosclerosis of native arteries of extremities, bilateral legs: Principal | ICD-10-CM

## 2023-06-06 DIAGNOSIS — D631 Anemia in chronic kidney disease: Principal | ICD-10-CM

## 2023-06-06 DIAGNOSIS — N186 End stage renal disease: Principal | ICD-10-CM

## 2023-06-07 ENCOUNTER — Ambulatory Visit
Admit: 2023-06-07 | Discharge: 2023-06-11 | Disposition: A | Discharge status: Home Health | Payer: MEDICARE | Admitting: Student in an Organized Health Care Education/Training Program

## 2023-06-07 ENCOUNTER — Encounter
Admit: 2023-06-07 | Discharge: 2023-06-11 | Disposition: A | Discharge status: Home Health | Payer: MEDICARE | Admitting: Student in an Organized Health Care Education/Training Program

## 2023-06-08 DIAGNOSIS — N186 End stage renal disease: Principal | ICD-10-CM

## 2023-06-08 DIAGNOSIS — I272 Pulmonary hypertension, unspecified: Principal | ICD-10-CM

## 2023-06-08 DIAGNOSIS — I70203 Unspecified atherosclerosis of native arteries of extremities, bilateral legs: Principal | ICD-10-CM

## 2023-06-08 DIAGNOSIS — F319 Bipolar disorder, unspecified: Principal | ICD-10-CM

## 2023-06-08 DIAGNOSIS — G894 Chronic pain syndrome: Principal | ICD-10-CM

## 2023-06-08 DIAGNOSIS — G928 Other toxic encephalopathy: Principal | ICD-10-CM

## 2023-06-08 DIAGNOSIS — G2581 Restless legs syndrome: Principal | ICD-10-CM

## 2023-06-08 DIAGNOSIS — R0602 Shortness of breath: Principal | ICD-10-CM

## 2023-06-08 DIAGNOSIS — E11621 Type 2 diabetes mellitus with foot ulcer: Principal | ICD-10-CM

## 2023-06-08 DIAGNOSIS — B182 Chronic viral hepatitis C: Principal | ICD-10-CM

## 2023-06-08 DIAGNOSIS — E871 Hypo-osmolality and hyponatremia: Principal | ICD-10-CM

## 2023-06-08 DIAGNOSIS — E785 Hyperlipidemia, unspecified: Principal | ICD-10-CM

## 2023-06-08 DIAGNOSIS — T428X1D Poisoning by antiparkinsonism drugs and other central muscle-tone depressants, accidental (unintentional), subsequent encounter: Principal | ICD-10-CM

## 2023-06-08 DIAGNOSIS — E1169 Type 2 diabetes mellitus with other specified complication: Principal | ICD-10-CM

## 2023-06-08 DIAGNOSIS — L97412 Non-pressure chronic ulcer of right heel and midfoot with fat layer exposed: Principal | ICD-10-CM

## 2023-06-08 DIAGNOSIS — E1142 Type 2 diabetes mellitus with diabetic polyneuropathy: Principal | ICD-10-CM

## 2023-06-08 DIAGNOSIS — I2510 Atherosclerotic heart disease of native coronary artery without angina pectoris: Principal | ICD-10-CM

## 2023-06-08 DIAGNOSIS — D631 Anemia in chronic kidney disease: Principal | ICD-10-CM

## 2023-06-08 DIAGNOSIS — I5042 Chronic combined systolic (congestive) and diastolic (congestive) heart failure: Principal | ICD-10-CM

## 2023-06-08 DIAGNOSIS — I151 Hypertension secondary to other renal disorders: Principal | ICD-10-CM

## 2023-06-08 DIAGNOSIS — E1151 Type 2 diabetes mellitus with diabetic peripheral angiopathy without gangrene: Principal | ICD-10-CM

## 2023-06-08 DIAGNOSIS — Z01818 Encounter for other preprocedural examination: Principal | ICD-10-CM

## 2023-06-08 DIAGNOSIS — E559 Vitamin D deficiency, unspecified: Principal | ICD-10-CM

## 2023-06-08 DIAGNOSIS — E890 Postprocedural hypothyroidism: Principal | ICD-10-CM

## 2023-06-08 DIAGNOSIS — I110 Hypertensive heart disease with heart failure: Principal | ICD-10-CM

## 2023-06-08 DIAGNOSIS — E1143 Type 2 diabetes mellitus with diabetic autonomic (poly)neuropathy: Principal | ICD-10-CM

## 2023-06-08 DIAGNOSIS — M86171 Other acute osteomyelitis, right ankle and foot: Principal | ICD-10-CM

## 2023-06-08 DIAGNOSIS — I350 Nonrheumatic aortic (valve) stenosis: Principal | ICD-10-CM

## 2023-06-11 MED ORDER — HYDROXYZINE PAMOATE 50 MG CAPSULE
ORAL_CAPSULE | Freq: Every day | ORAL | 0 refills | 90 days | Status: CP | PRN
Start: 2023-06-11 — End: 2023-09-09
  Filled 2023-07-04: qty 90, 90d supply, fill #0

## 2023-06-12 DIAGNOSIS — G894 Chronic pain syndrome: Principal | ICD-10-CM

## 2023-06-12 DIAGNOSIS — I272 Pulmonary hypertension, unspecified: Principal | ICD-10-CM

## 2023-06-12 DIAGNOSIS — Z952 Presence of prosthetic heart valve: Principal | ICD-10-CM

## 2023-06-12 DIAGNOSIS — I2510 Atherosclerotic heart disease of native coronary artery without angina pectoris: Principal | ICD-10-CM

## 2023-06-12 DIAGNOSIS — L97412 Non-pressure chronic ulcer of right heel and midfoot with fat layer exposed: Principal | ICD-10-CM

## 2023-06-12 DIAGNOSIS — E1142 Type 2 diabetes mellitus with diabetic polyneuropathy: Principal | ICD-10-CM

## 2023-06-12 DIAGNOSIS — E1151 Type 2 diabetes mellitus with diabetic peripheral angiopathy without gangrene: Principal | ICD-10-CM

## 2023-06-12 DIAGNOSIS — N186 End stage renal disease: Principal | ICD-10-CM

## 2023-06-12 DIAGNOSIS — I5042 Chronic combined systolic (congestive) and diastolic (congestive) heart failure: Principal | ICD-10-CM

## 2023-06-12 DIAGNOSIS — T428X1D Poisoning by antiparkinsonism drugs and other central muscle-tone depressants, accidental (unintentional), subsequent encounter: Principal | ICD-10-CM

## 2023-06-12 DIAGNOSIS — E559 Vitamin D deficiency, unspecified: Principal | ICD-10-CM

## 2023-06-12 DIAGNOSIS — I70203 Unspecified atherosclerosis of native arteries of extremities, bilateral legs: Principal | ICD-10-CM

## 2023-06-12 DIAGNOSIS — G2581 Restless legs syndrome: Principal | ICD-10-CM

## 2023-06-12 DIAGNOSIS — E11621 Type 2 diabetes mellitus with foot ulcer: Principal | ICD-10-CM

## 2023-06-12 DIAGNOSIS — I110 Hypertensive heart disease with heart failure: Principal | ICD-10-CM

## 2023-06-12 DIAGNOSIS — E785 Hyperlipidemia, unspecified: Principal | ICD-10-CM

## 2023-06-12 DIAGNOSIS — B182 Chronic viral hepatitis C: Principal | ICD-10-CM

## 2023-06-12 DIAGNOSIS — E1143 Type 2 diabetes mellitus with diabetic autonomic (poly)neuropathy: Principal | ICD-10-CM

## 2023-06-12 DIAGNOSIS — E1169 Type 2 diabetes mellitus with other specified complication: Principal | ICD-10-CM

## 2023-06-12 DIAGNOSIS — E890 Postprocedural hypothyroidism: Principal | ICD-10-CM

## 2023-06-12 DIAGNOSIS — E871 Hypo-osmolality and hyponatremia: Principal | ICD-10-CM

## 2023-06-12 DIAGNOSIS — F319 Bipolar disorder, unspecified: Principal | ICD-10-CM

## 2023-06-12 DIAGNOSIS — G928 Other toxic encephalopathy: Principal | ICD-10-CM

## 2023-06-12 DIAGNOSIS — I151 Hypertension secondary to other renal disorders: Principal | ICD-10-CM

## 2023-06-12 DIAGNOSIS — D631 Anemia in chronic kidney disease: Principal | ICD-10-CM

## 2023-06-12 DIAGNOSIS — M86171 Other acute osteomyelitis, right ankle and foot: Principal | ICD-10-CM

## 2023-06-14 DIAGNOSIS — L97509 Non-pressure chronic ulcer of other part of unspecified foot with unspecified severity: Principal | ICD-10-CM

## 2023-06-14 DIAGNOSIS — I350 Nonrheumatic aortic (valve) stenosis: Principal | ICD-10-CM

## 2023-06-14 DIAGNOSIS — I151 Hypertension secondary to other renal disorders: Principal | ICD-10-CM

## 2023-06-14 DIAGNOSIS — E11621 Type 2 diabetes mellitus with foot ulcer: Principal | ICD-10-CM

## 2023-06-14 DIAGNOSIS — Z794 Long term (current) use of insulin: Principal | ICD-10-CM

## 2023-06-14 DIAGNOSIS — I5043 Acute on chronic combined systolic (congestive) and diastolic (congestive) heart failure: Principal | ICD-10-CM

## 2023-06-14 DIAGNOSIS — I2510 Atherosclerotic heart disease of native coronary artery without angina pectoris: Principal | ICD-10-CM

## 2023-06-15 ENCOUNTER — Ambulatory Visit: Admit: 2023-06-15 | Discharge: 2023-06-16 | Payer: MEDICARE | Attending: Podiatrist | Primary: Podiatrist

## 2023-06-15 DIAGNOSIS — E785 Hyperlipidemia, unspecified: Principal | ICD-10-CM

## 2023-06-15 DIAGNOSIS — F319 Bipolar disorder, unspecified: Principal | ICD-10-CM

## 2023-06-15 DIAGNOSIS — I110 Hypertensive heart disease with heart failure: Principal | ICD-10-CM

## 2023-06-15 DIAGNOSIS — E871 Hypo-osmolality and hyponatremia: Principal | ICD-10-CM

## 2023-06-15 DIAGNOSIS — I5042 Chronic combined systolic (congestive) and diastolic (congestive) heart failure: Principal | ICD-10-CM

## 2023-06-15 DIAGNOSIS — E1151 Type 2 diabetes mellitus with diabetic peripheral angiopathy without gangrene: Principal | ICD-10-CM

## 2023-06-15 DIAGNOSIS — I151 Hypertension secondary to other renal disorders: Principal | ICD-10-CM

## 2023-06-15 DIAGNOSIS — B182 Chronic viral hepatitis C: Principal | ICD-10-CM

## 2023-06-15 DIAGNOSIS — G894 Chronic pain syndrome: Principal | ICD-10-CM

## 2023-06-15 DIAGNOSIS — E1143 Type 2 diabetes mellitus with diabetic autonomic (poly)neuropathy: Principal | ICD-10-CM

## 2023-06-15 DIAGNOSIS — G2581 Restless legs syndrome: Principal | ICD-10-CM

## 2023-06-15 DIAGNOSIS — L97412 Non-pressure chronic ulcer of right heel and midfoot with fat layer exposed: Principal | ICD-10-CM

## 2023-06-15 DIAGNOSIS — E11621 Type 2 diabetes mellitus with foot ulcer: Principal | ICD-10-CM

## 2023-06-15 DIAGNOSIS — D631 Anemia in chronic kidney disease: Principal | ICD-10-CM

## 2023-06-15 DIAGNOSIS — E1142 Type 2 diabetes mellitus with diabetic polyneuropathy: Principal | ICD-10-CM

## 2023-06-15 DIAGNOSIS — E1169 Type 2 diabetes mellitus with other specified complication: Principal | ICD-10-CM

## 2023-06-15 DIAGNOSIS — I272 Pulmonary hypertension, unspecified: Principal | ICD-10-CM

## 2023-06-15 DIAGNOSIS — N186 End stage renal disease: Principal | ICD-10-CM

## 2023-06-15 DIAGNOSIS — G928 Other toxic encephalopathy: Principal | ICD-10-CM

## 2023-06-15 DIAGNOSIS — I70203 Unspecified atherosclerosis of native arteries of extremities, bilateral legs: Principal | ICD-10-CM

## 2023-06-15 DIAGNOSIS — T428X1D Poisoning by antiparkinsonism drugs and other central muscle-tone depressants, accidental (unintentional), subsequent encounter: Principal | ICD-10-CM

## 2023-06-15 DIAGNOSIS — E559 Vitamin D deficiency, unspecified: Principal | ICD-10-CM

## 2023-06-15 DIAGNOSIS — E890 Postprocedural hypothyroidism: Principal | ICD-10-CM

## 2023-06-15 DIAGNOSIS — I2510 Atherosclerotic heart disease of native coronary artery without angina pectoris: Principal | ICD-10-CM

## 2023-06-15 DIAGNOSIS — M86171 Other acute osteomyelitis, right ankle and foot: Principal | ICD-10-CM

## 2023-06-15 DIAGNOSIS — L97413 Non-pressure chronic ulcer of right heel and midfoot with necrosis of muscle: Principal | ICD-10-CM

## 2023-06-16 DIAGNOSIS — B182 Chronic viral hepatitis C: Principal | ICD-10-CM

## 2023-06-16 DIAGNOSIS — I110 Hypertensive heart disease with heart failure: Principal | ICD-10-CM

## 2023-06-16 DIAGNOSIS — G2581 Restless legs syndrome: Principal | ICD-10-CM

## 2023-06-16 DIAGNOSIS — F319 Bipolar disorder, unspecified: Principal | ICD-10-CM

## 2023-06-16 DIAGNOSIS — E1143 Type 2 diabetes mellitus with diabetic autonomic (poly)neuropathy: Principal | ICD-10-CM

## 2023-06-16 DIAGNOSIS — G894 Chronic pain syndrome: Principal | ICD-10-CM

## 2023-06-16 DIAGNOSIS — E559 Vitamin D deficiency, unspecified: Principal | ICD-10-CM

## 2023-06-16 DIAGNOSIS — I5042 Chronic combined systolic (congestive) and diastolic (congestive) heart failure: Principal | ICD-10-CM

## 2023-06-16 DIAGNOSIS — E1142 Type 2 diabetes mellitus with diabetic polyneuropathy: Principal | ICD-10-CM

## 2023-06-16 DIAGNOSIS — I70203 Unspecified atherosclerosis of native arteries of extremities, bilateral legs: Principal | ICD-10-CM

## 2023-06-16 DIAGNOSIS — N186 End stage renal disease: Principal | ICD-10-CM

## 2023-06-16 DIAGNOSIS — E785 Hyperlipidemia, unspecified: Principal | ICD-10-CM

## 2023-06-16 DIAGNOSIS — I2510 Atherosclerotic heart disease of native coronary artery without angina pectoris: Principal | ICD-10-CM

## 2023-06-16 DIAGNOSIS — E11621 Type 2 diabetes mellitus with foot ulcer: Principal | ICD-10-CM

## 2023-06-16 DIAGNOSIS — E871 Hypo-osmolality and hyponatremia: Principal | ICD-10-CM

## 2023-06-16 DIAGNOSIS — M86171 Other acute osteomyelitis, right ankle and foot: Principal | ICD-10-CM

## 2023-06-16 DIAGNOSIS — E1151 Type 2 diabetes mellitus with diabetic peripheral angiopathy without gangrene: Principal | ICD-10-CM

## 2023-06-16 DIAGNOSIS — T428X1D Poisoning by antiparkinsonism drugs and other central muscle-tone depressants, accidental (unintentional), subsequent encounter: Principal | ICD-10-CM

## 2023-06-16 DIAGNOSIS — D631 Anemia in chronic kidney disease: Principal | ICD-10-CM

## 2023-06-16 DIAGNOSIS — G928 Other toxic encephalopathy: Principal | ICD-10-CM

## 2023-06-16 DIAGNOSIS — E890 Postprocedural hypothyroidism: Principal | ICD-10-CM

## 2023-06-16 DIAGNOSIS — I151 Hypertension secondary to other renal disorders: Principal | ICD-10-CM

## 2023-06-16 DIAGNOSIS — I272 Pulmonary hypertension, unspecified: Principal | ICD-10-CM

## 2023-06-16 DIAGNOSIS — L97412 Non-pressure chronic ulcer of right heel and midfoot with fat layer exposed: Principal | ICD-10-CM

## 2023-06-16 DIAGNOSIS — E1169 Type 2 diabetes mellitus with other specified complication: Principal | ICD-10-CM

## 2023-06-21 DIAGNOSIS — M86171 Other acute osteomyelitis, right ankle and foot: Principal | ICD-10-CM

## 2023-06-21 DIAGNOSIS — D631 Anemia in chronic kidney disease: Principal | ICD-10-CM

## 2023-06-21 DIAGNOSIS — I110 Hypertensive heart disease with heart failure: Principal | ICD-10-CM

## 2023-06-21 DIAGNOSIS — I151 Hypertension secondary to other renal disorders: Principal | ICD-10-CM

## 2023-06-21 DIAGNOSIS — I70203 Unspecified atherosclerosis of native arteries of extremities, bilateral legs: Principal | ICD-10-CM

## 2023-06-21 DIAGNOSIS — I5042 Chronic combined systolic (congestive) and diastolic (congestive) heart failure: Principal | ICD-10-CM

## 2023-06-21 DIAGNOSIS — B182 Chronic viral hepatitis C: Principal | ICD-10-CM

## 2023-06-21 DIAGNOSIS — E890 Postprocedural hypothyroidism: Principal | ICD-10-CM

## 2023-06-21 DIAGNOSIS — N186 End stage renal disease: Principal | ICD-10-CM

## 2023-06-21 DIAGNOSIS — E11621 Type 2 diabetes mellitus with foot ulcer: Principal | ICD-10-CM

## 2023-06-21 DIAGNOSIS — G894 Chronic pain syndrome: Principal | ICD-10-CM

## 2023-06-21 DIAGNOSIS — F319 Bipolar disorder, unspecified: Principal | ICD-10-CM

## 2023-06-21 DIAGNOSIS — I2510 Atherosclerotic heart disease of native coronary artery without angina pectoris: Principal | ICD-10-CM

## 2023-06-21 DIAGNOSIS — G2581 Restless legs syndrome: Principal | ICD-10-CM

## 2023-06-21 DIAGNOSIS — I272 Pulmonary hypertension, unspecified: Principal | ICD-10-CM

## 2023-06-21 DIAGNOSIS — E785 Hyperlipidemia, unspecified: Principal | ICD-10-CM

## 2023-06-21 DIAGNOSIS — E871 Hypo-osmolality and hyponatremia: Principal | ICD-10-CM

## 2023-06-21 DIAGNOSIS — T428X1D Poisoning by antiparkinsonism drugs and other central muscle-tone depressants, accidental (unintentional), subsequent encounter: Principal | ICD-10-CM

## 2023-06-21 DIAGNOSIS — E1169 Type 2 diabetes mellitus with other specified complication: Principal | ICD-10-CM

## 2023-06-21 DIAGNOSIS — G928 Other toxic encephalopathy: Principal | ICD-10-CM

## 2023-06-21 DIAGNOSIS — E1142 Type 2 diabetes mellitus with diabetic polyneuropathy: Principal | ICD-10-CM

## 2023-06-21 DIAGNOSIS — E559 Vitamin D deficiency, unspecified: Principal | ICD-10-CM

## 2023-06-21 DIAGNOSIS — E1143 Type 2 diabetes mellitus with diabetic autonomic (poly)neuropathy: Principal | ICD-10-CM

## 2023-06-21 DIAGNOSIS — L97412 Non-pressure chronic ulcer of right heel and midfoot with fat layer exposed: Principal | ICD-10-CM

## 2023-06-21 DIAGNOSIS — E1151 Type 2 diabetes mellitus with diabetic peripheral angiopathy without gangrene: Principal | ICD-10-CM

## 2023-06-25 ENCOUNTER — Encounter: Admit: 2023-06-25 | Discharge: 2023-07-24 | Payer: MEDICARE

## 2023-06-25 ENCOUNTER — Encounter: Admit: 2023-06-25 | Payer: MEDICARE

## 2023-06-25 ENCOUNTER — Encounter: Admit: 2023-06-25 | Payer: MEDICARE | Attending: Nurse Practitioner | Primary: Nurse Practitioner

## 2023-06-27 ENCOUNTER — Ambulatory Visit: Admit: 2023-06-27 | Discharge: 2023-06-28 | Payer: MEDICARE

## 2023-06-27 DIAGNOSIS — E11628 Type 2 diabetes mellitus with other skin complications: Principal | ICD-10-CM

## 2023-06-27 DIAGNOSIS — E1140 Type 2 diabetes mellitus with diabetic neuropathy, unspecified: Principal | ICD-10-CM

## 2023-06-27 DIAGNOSIS — F3175 Bipolar disorder, in partial remission, most recent episode depressed: Principal | ICD-10-CM

## 2023-06-27 DIAGNOSIS — R110 Nausea: Principal | ICD-10-CM

## 2023-06-27 DIAGNOSIS — L97509 Non-pressure chronic ulcer of other part of unspecified foot with unspecified severity: Principal | ICD-10-CM

## 2023-06-27 DIAGNOSIS — K219 Gastro-esophageal reflux disease without esophagitis: Principal | ICD-10-CM

## 2023-06-27 DIAGNOSIS — I151 Hypertension secondary to other renal disorders: Principal | ICD-10-CM

## 2023-06-27 DIAGNOSIS — D631 Anemia in chronic kidney disease: Principal | ICD-10-CM

## 2023-06-27 DIAGNOSIS — N189 Chronic kidney disease, unspecified: Principal | ICD-10-CM

## 2023-06-27 DIAGNOSIS — E039 Hypothyroidism, unspecified: Principal | ICD-10-CM

## 2023-06-27 DIAGNOSIS — Z794 Long term (current) use of insulin: Principal | ICD-10-CM

## 2023-06-27 DIAGNOSIS — N186 End stage renal disease: Principal | ICD-10-CM

## 2023-06-27 DIAGNOSIS — L089 Local infection of the skin and subcutaneous tissue, unspecified: Principal | ICD-10-CM

## 2023-06-27 DIAGNOSIS — F339 Major depressive disorder, recurrent, unspecified: Principal | ICD-10-CM

## 2023-06-27 DIAGNOSIS — G629 Polyneuropathy, unspecified: Principal | ICD-10-CM

## 2023-06-27 DIAGNOSIS — I2510 Atherosclerotic heart disease of native coronary artery without angina pectoris: Principal | ICD-10-CM

## 2023-06-27 DIAGNOSIS — I5042 Chronic combined systolic (congestive) and diastolic (congestive) heart failure: Principal | ICD-10-CM

## 2023-06-27 DIAGNOSIS — E11621 Type 2 diabetes mellitus with foot ulcer: Principal | ICD-10-CM

## 2023-06-27 MED ORDER — ONDANSETRON HCL 4 MG TABLET
ORAL_TABLET | 0 refills | 0 days | Status: CP
Start: 2023-06-27 — End: ?

## 2023-06-27 MED ORDER — OMEPRAZOLE 20 MG CAPSULE,DELAYED RELEASE
ORAL_CAPSULE | Freq: Two times a day (BID) | ORAL | 2 refills | 90 days | Status: CP
Start: 2023-06-27 — End: ?

## 2023-06-28 ENCOUNTER — Emergency Department: Admit: 2023-06-28 | Discharge: 2023-06-28 | Disposition: A | Discharge status: Home / Self Care | Payer: MEDICARE

## 2023-06-28 ENCOUNTER — Ambulatory Visit: Admit: 2023-06-28 | Discharge: 2023-06-28 | Disposition: A | Discharge status: Home / Self Care | Payer: MEDICARE

## 2023-06-28 DIAGNOSIS — L089 Local infection of the skin and subcutaneous tissue, unspecified: Principal | ICD-10-CM

## 2023-06-28 DIAGNOSIS — E11628 Type 2 diabetes mellitus with other skin complications: Principal | ICD-10-CM

## 2023-06-28 MED ORDER — TIZANIDINE 4 MG TABLET
ORAL_TABLET | Freq: Two times a day (BID) | ORAL | 1 refills | 90 days | Status: CP | PRN
Start: 2023-06-28 — End: ?

## 2023-06-28 MED ORDER — OMEPRAZOLE 20 MG CAPSULE,DELAYED RELEASE
ORAL_CAPSULE | Freq: Two times a day (BID) | ORAL | 2 refills | 90 days | Status: CP
Start: 2023-06-28 — End: ?

## 2023-06-29 ENCOUNTER — Telehealth
Admit: 2023-06-29 | Discharge: 2023-06-30 | Payer: MEDICARE | Attending: Psychiatric/Mental Health | Primary: Psychiatric/Mental Health

## 2023-06-29 ENCOUNTER — Ambulatory Visit: Admit: 2023-06-29 | Discharge: 2023-06-30 | Payer: MEDICARE | Attending: Medical | Primary: Medical

## 2023-06-29 ENCOUNTER — Ambulatory Visit: Admit: 2023-06-29 | Discharge: 2023-06-30 | Payer: MEDICARE

## 2023-06-29 DIAGNOSIS — I240 Acute coronary thrombosis not resulting in myocardial infarction: Principal | ICD-10-CM

## 2023-06-29 DIAGNOSIS — I259 Chronic ischemic heart disease, unspecified: Principal | ICD-10-CM

## 2023-06-29 DIAGNOSIS — N186 End stage renal disease: Principal | ICD-10-CM

## 2023-06-29 DIAGNOSIS — F331 Major depressive disorder, recurrent, moderate: Principal | ICD-10-CM

## 2023-06-29 DIAGNOSIS — R0602 Shortness of breath: Principal | ICD-10-CM

## 2023-06-29 DIAGNOSIS — I10 Essential (primary) hypertension: Principal | ICD-10-CM

## 2023-06-29 DIAGNOSIS — I350 Nonrheumatic aortic (valve) stenosis: Principal | ICD-10-CM

## 2023-06-29 DIAGNOSIS — G4700 Insomnia, unspecified: Principal | ICD-10-CM

## 2023-06-29 MED ORDER — AMLODIPINE 10 MG TABLET
ORAL | 3 refills | 90 days | Status: CP
Start: 2023-06-29 — End: ?

## 2023-07-02 ENCOUNTER — Encounter: Payer: Self-pay | Admitting: Emergency Medicine

## 2023-07-02 ENCOUNTER — Ambulatory Visit
Admission: EM | Admit: 2023-07-02 | Discharge: 2023-07-02 | Disposition: A | Discharge status: Home / Self Care | Payer: Medicare PPO | Attending: Family Medicine | Admitting: Family Medicine

## 2023-07-02 DIAGNOSIS — N390 Urinary tract infection, site not specified: Secondary | ICD-10-CM | POA: Insufficient documentation

## 2023-07-02 LAB — URINALYSIS, W/ REFLEX TO CULTURE (INFECTION SUSPECTED)
Glucose, UA: NEGATIVE mg/dL
Nitrite: NEGATIVE
Protein, ur: >300 mg/dL — AB
Specific Gravity, Urine: 1.025 (ref 1.005–1.030)
WBC, UA: >50 WBC/hpf (ref 0–5)
pH: 5.5 (ref 5.0–8.0)

## 2023-07-02 MED ORDER — PHENAZOPYRIDINE HCL 200 MG PO TABS: 200.0000 mg | ORAL_TABLET | Freq: Three times a day (TID) | ORAL | 0 refills | Status: AC

## 2023-07-02 MED ORDER — NITROFURANTOIN MONOHYD MACRO 100 MG PO CAPS: 100.0000 mg | ORAL_CAPSULE | Freq: Two times a day (BID) | ORAL | 0 refills | Status: AC

## 2023-07-02 MED ORDER — LEVOTHYROXINE 88 MCG TABLET
ORAL_TABLET | Freq: Every day | ORAL | 0 refills | 30 days
Start: 2023-07-02 — End: 2023-08-01

## 2023-07-02 NOTE — Discharge Instructions (Addendum)

## 2023-07-02 NOTE — ED Provider Notes (Signed)
[facility]    CSN: [id number] Arrival date & time: 07/02/23  1520      History   Chief Complaint Chief Complaint  Patient presents with   Dysuria    HPI Alyze Meeler is a 57 y.o. female.   HPI  57 year old female with a past medical history significant for diabetes, heart disease, hypertension, kidney disease, PE, and thyroid disease presents for evaluation of 3 days worth of urinary symptoms which consist of burning with urination along with urinary urgency and frequency, cloudy urine with an odor, low back pain, and some episodes of urinary incontinence.  She is also had some nausea and vomiting on occasion.  She denies any fever, blood in her urine, vaginal itching, or vaginal discharge.  Past Medical History:  Diagnosis Date   Diabetes mellitus without complication (HCC)    Heart disease    Hypertension    Kidney disease    Pulmonary embolism (HCC)    Thyroid disease     Patient Active Problem List   Diagnosis Date Noted   Acute UTI 12/25/2021   Left shoulder strain, initial encounter 12/25/2021    Past Surgical History:  Procedure Laterality Date   ABDOMINAL HYSTERECTOMY     AV FISTULA PLACEMENT     BACK SURGERY     COSMETIC SURGERY     GALLBLADDER SURGERY     GASTRIC BYPASS     HERNIA REPAIR      OB History   No obstetric history on file.      Home Medications    Prior to Admission medications   Medication Sig Start Date End Date Taking? Authorizing Provider  amLODipine (NORVASC) 10 MG tablet Take by mouth. 03/14/22  Yes [provider]  atorvastatin (LIPITOR) 40 MG tablet Take 1 tablet by mouth daily. 04/19/21  Yes [provider]  bumetanide (BUMEX) 1 MG tablet 2 tablet 2 TIMES DAILY (route: oral) 08/25/20  Yes [provider]  hydrOXYzine (ATARAX/VISTARIL) 50 MG tablet Take 50 mg by mouth 2 (two) times daily. 04/13/21  Yes [provider]  insulin glargine (LANTUS SOLOSTAR) 100 UNIT/ML Solostar Pen 2  unit BEDTIME (route: subcutaneous) 01/15/21  Yes [provider]  insulin lispro (HUMALOG) 100 UNIT/ML KwikPen Inject 10 Units into the skin 3 (three) times daily. 05/25/21  Yes Cook, Jayce G, DO  lamoTRIgine (LAMICTAL) 100 MG tablet Take 100 mg by mouth daily. 05/04/21  Yes [provider]  levothyroxine (SYNTHROID) 75 MCG tablet 1 tablet DAILY (route: oral) 01/15/21  Yes [provider]  metoCLOPramide (REGLAN) 5 MG tablet Take by mouth. 09/19/18  Yes [provider]  metoprolol tartrate (LOPRESSOR) 100 MG tablet 1 tablet 2 TIMES DAILY (route: oral) 11/03/20  Yes [provider]  nitrofurantoin, macrocrystal-monohydrate, (MACROBID) 100 MG capsule Take 1 capsule (100 mg total) by mouth 2 (two) times daily. 07/02/23  Yes Becky Augusta, NP  phenazopyridine (PYRIDIUM) 200 MG tablet Take 1 tablet (200 mg total) by mouth 3 (three) times daily. 07/02/23  Yes Becky Augusta, NP  QUEtiapine (SEROQUEL XR) 50 MG TB24 24 hr tablet Take by mouth. 09/19/18  Yes [provider]  sertraline (ZOLOFT) 50 MG tablet Take 3 tablets by mouth daily. 04/13/21  Yes [provider]  ticagrelor (BRILINTA) 90 MG TABS tablet Take by mouth. 06/12/23  Yes [provider]  valsartan (DIOVAN) 160 MG tablet Take by mouth. 12/21/22  Yes [provider]  apixaban (ELIQUIS) 5 MG TABS tablet Take by mouth. 03/20/23  [provider]  Aspirin Buf,CaCarb-MgCarb-MgO, 81 MG TABS Take 1 tablet by mouth daily. 05/09/18   [provider]  baclofen (LIORESAL) 10 MG tablet Take 0.5-1 tablets (5-10 mg total) by mouth 2 (two) times daily as needed for muscle spasms. 04/01/23   Tommie Sams, DO  carvedilol (COREG) 25 MG tablet Take 1 tablet by mouth 2 (two) times daily. 07/04/18   [provider]  EPCLUSA 400-100 MG TABS Take 1 tablet by mouth daily. 04/19/21   [provider]  fluticasone (FLONASE) 50 MCG/ACT nasal spray 1 spray DAILY (route: nasal)  01/22/21   [provider]  lidocaine (XYLOCAINE) 2 % solution Use as directed 15 mLs in the mouth or throat every 4 (four) hours as needed for mouth pain. 01/08/22   White, Elita Boone, NP  pregabalin (LYRICA) 75 MG capsule Take 75 mg by mouth daily. 01/05/21   [provider]  ALBUTEROL IN 2 puff 2 TIMES DAILY (route: inhalation) 01/22/21 05/25/21  [provider]  CLONAZEPAM PO 0.5 tablet AS DIRECTED (route: oral) 12/11/20 05/25/21  [provider]    Family History Family History  Problem Relation Age of Onset   Hypertension Mother    Diabetes Mother    Cancer Mother    Hypertension Father    Diabetes Father    Heart failure Father     Social History Social History   Tobacco Use   Smoking status: Former    Current packs/day: 0.00    Types: Cigarettes    Quit date: 03/07/2021    Years since quitting: 2.3   Smokeless tobacco: Never  Vaping Use   Vaping status: Never Used  Substance Use Topics   Alcohol use: Never   Drug use: Never     Allergies   Penicillins, Morphine, and Nsaids   Review of Systems Review of Systems  Constitutional:  Negative for fever.  Gastrointestinal:  Positive for nausea and vomiting.  Genitourinary:  Positive for decreased urine volume, dysuria and frequency. Negative for hematuria, vaginal discharge and vaginal pain.  Musculoskeletal:  Negative for back pain.     Physical Exam Triage Vital Signs ED Triage Vitals  Encounter Vitals Group     BP      Systolic BP Percentile      Diastolic BP Percentile      Pulse      Resp      Temp      Temp src      SpO2      Weight      Height      Head Circumference      Peak Flow      Pain Score      Pain Loc      Pain Education      Exclude from Growth Chart    No data found.  Updated Vital Signs BP 114/68 (BP Location: Left Arm)   Pulse (!) 58   Temp 99 F (37.2 C) (Oral)   Resp 16   Ht 5\' 5"  (1.651 m)   Wt 179 lb 14.3 oz (81.6 kg)   SpO2 94%   BMI  29.94 kg/m   Visual Acuity Right Eye Distance:   Left Eye Distance:   Bilateral Distance:    Right Eye Near:   Left Eye Near:    Bilateral Near:     Physical Exam Vitals and nursing note reviewed.  Constitutional:      Appearance: Normal appearance. She is not ill-appearing.  HENT:     Head: Normocephalic and atraumatic.  Cardiovascular:     Rate and Rhythm: Normal rate and regular rhythm.     Pulses: Normal pulses.     Heart sounds: Normal heart sounds. No murmur heard.    No friction rub. No gallop.  Pulmonary:     Effort: Pulmonary effort is normal.     Breath sounds: Normal breath sounds. No wheezing, rhonchi or rales.  Abdominal:     Tenderness: There is no right CVA tenderness or left CVA tenderness.  Skin:    General: Skin is warm and dry.     Capillary Refill: Capillary refill takes less than 2 seconds.  Neurological:     General: No focal deficit present.     Mental Status: She is alert and oriented to person, place, and time.      UC Treatments / Results  Labs (all labs ordered are listed, but only abnormal results are displayed) Labs Reviewed  URINALYSIS, W/ REFLEX TO CULTURE (INFECTION SUSPECTED) - Abnormal; Notable for the following components:      Result Value   APPearance CLOUDY (*)    Hgb urine dipstick SMALL (*)    Bilirubin Urine SMALL (*)    Ketones, ur TRACE (*)    Protein, ur >300 (*)    Leukocytes,Ua LARGE (*)    Non Squamous Epithelial PRESENT (*)    Bacteria, UA MANY (*)    All other components within normal limits  URINE CULTURE    EKG   Radiology No results found.  Procedures Procedures (including critical care time)  Medications Ordered in UC Medications - No data to display  Initial Impression / Assessment and Plan / UC Course  I have reviewed the triage vital signs and the nursing notes.  Pertinent labs & imaging results that were available during my care of the patient were reviewed by me and considered in my  medical decision making (see chart for details).   Patient is a pleasant, nontoxic-appearing 57 year old female presenting for evaluation of 3 days with urinary complaints as outlined in HPI above.  Her physical exam reveals a benign cardiopulmonary exam and no CVA tenderness on exam.  She does have a mildly elevated temp here in clinic of 99 but no measured fever.  She does have a history of UTIs and states that it has been approximately 6 months since she had her last 1.  I will order urinalysis to evaluate for the presence of urinary tract infection.  Urinalysis shows cloudy appearance with small hemoglobins, small bilirubin, trace ketones, greater than 300 protein, and large leukocyte esterase but is negative for nitrites.  Reflex microscopy shows non-squamous epithelials present with greater than 50 WBCs, 6-10 RBCs, and many bacteria.  Urine will reflex to culture.  I will discharge patient home on Macrobid 100 mg twice daily for 5 days for treatment of UTI along with Pyridium every 8 hours as needed for urinary discomfort.   Final Clinical Impressions(s) / UC Diagnoses   Final diagnoses:  Lower urinary tract infectious disease     Discharge Instructions      Take the Macrobid twice daily for 5 days with food for treatment of urinary tract infection.  Use the Pyridium every 8 hours as needed for urinary discomfort.  This will turn your urine a bright red-orange.  Increase your oral fluid intake so that you increase your urine production and or flushing your urinary system.  Take an over-the-counter probiotic, such as Culturelle-Align-Activia,  1 hour after each dose of antibiotic to prevent diarrhea or yeast infections from forming.  We will culture urine and change the antibiotics if necessary.  Return for reevaluation, or see your primary care provider, for any new or worsening symptoms.      ED Prescriptions     Medication Sig Dispense Auth. Provider   nitrofurantoin,  macrocrystal-monohydrate, (MACROBID) 100 MG capsule Take 1 capsule (100 mg total) by mouth 2 (two) times daily. 10 capsule Becky Augusta, NP   phenazopyridine (PYRIDIUM) 200 MG tablet Take 1 tablet (200 mg total) by mouth 3 (three) times daily. 18 tablet Becky Augusta, NP      PDMP not reviewed this encounter.   Becky Augusta, NP 07/02/23 1609

## 2023-07-02 NOTE — ED Triage Notes (Signed)
Pt c/o dysuria, cloudy, odorous urine, urinary incontinence, lower back pain and frequency. Started about 3 days ago. Denies fever.

## 2023-07-03 MED ORDER — LEVOTHYROXINE 88 MCG TABLET
ORAL_TABLET | Freq: Every day | ORAL | 0 refills | 30 days
Start: 2023-07-03 — End: 2023-08-02

## 2023-07-04 ENCOUNTER — Ambulatory Visit: Admit: 2023-07-04 | Discharge: 2023-07-05 | Payer: MEDICARE

## 2023-07-04 LAB — URINE CULTURE

## 2023-07-04 MED FILL — BRILINTA 90 MG TABLET: ORAL | 30 days supply | Qty: 60 | Fill #0

## 2023-07-05 ENCOUNTER — Telehealth (HOSPITAL_COMMUNITY): Payer: Self-pay | Admitting: Emergency Medicine

## 2023-07-05 LAB — URINE CULTURE: Culture: >=100000 — AB

## 2023-07-05 MED ORDER — SULFAMETHOXAZOLE-TRIMETHOPRIM 800-160 MG PO TABS: 1.0000 | ORAL_TABLET | Freq: Two times a day (BID) | ORAL | 0 refills | Status: AC

## 2023-07-06 ENCOUNTER — Ambulatory Visit: Admit: 2023-07-06 | Discharge: 2023-07-07 | Payer: MEDICARE

## 2023-07-06 DIAGNOSIS — L97519 Non-pressure chronic ulcer of other part of right foot with unspecified severity: Principal | ICD-10-CM

## 2023-07-06 DIAGNOSIS — L97413 Non-pressure chronic ulcer of right heel and midfoot with necrosis of muscle: Principal | ICD-10-CM

## 2023-07-06 DIAGNOSIS — E11621 Type 2 diabetes mellitus with foot ulcer: Principal | ICD-10-CM

## 2023-07-06 MED ORDER — LEVOTHYROXINE 88 MCG TABLET
ORAL_TABLET | Freq: Every day | ORAL | 0 refills | 30 days
Start: 2023-07-06 — End: 2023-08-05

## 2023-07-07 MED ORDER — LEVOTHYROXINE 88 MCG TABLET
ORAL_TABLET | Freq: Every day | ORAL | 0 refills | 30 days
Start: 2023-07-07 — End: 2023-08-06

## 2023-07-10 MED ORDER — LEVOTHYROXINE 88 MCG TABLET
ORAL_TABLET | Freq: Every day | ORAL | 0 refills | 30 days
Start: 2023-07-10 — End: 2023-08-09

## 2023-07-11 ENCOUNTER — Ambulatory Visit: Admit: 2023-07-11 | Discharge: 2023-07-12 | Payer: MEDICARE | Attending: Vascular Surgery | Primary: Vascular Surgery

## 2023-07-11 DIAGNOSIS — L97409 Non-pressure chronic ulcer of unspecified heel and midfoot with unspecified severity: Principal | ICD-10-CM

## 2023-07-11 DIAGNOSIS — I739 Peripheral vascular disease, unspecified: Principal | ICD-10-CM

## 2023-07-13 ENCOUNTER — Ambulatory Visit: Admit: 2023-07-13 | Discharge: 2023-07-13 | Payer: MEDICARE

## 2023-07-18 MED ORDER — LEVOTHYROXINE 88 MCG TABLET
ORAL_TABLET | Freq: Every day | ORAL | 0 refills | 30 days
Start: 2023-07-18 — End: 2023-08-17

## 2023-07-21 DIAGNOSIS — I10 Essential (primary) hypertension: Principal | ICD-10-CM

## 2023-07-21 MED ORDER — CHLORTHALIDONE 25 MG TABLET
ORAL_TABLET | Freq: Every morning | ORAL | 1 refills | 90 days | Status: CP
Start: 2023-07-21 — End: 2024-07-20

## 2023-07-24 ENCOUNTER — Ambulatory Visit: Admit: 2023-07-24 | Discharge: 2023-07-25 | Payer: MEDICARE

## 2023-07-24 DIAGNOSIS — N3000 Acute cystitis without hematuria: Principal | ICD-10-CM

## 2023-07-24 MED ORDER — CIPROFLOXACIN 500 MG TABLET
ORAL_TABLET | Freq: Every day | ORAL | 0 refills | 7 days | Status: CP
Start: 2023-07-24 — End: 2023-07-31

## 2023-07-25 ENCOUNTER — Ambulatory Visit: Admit: 2023-07-25 | Discharge: 2023-07-26 | Payer: MEDICARE

## 2023-07-25 ENCOUNTER — Encounter: Admit: 2023-07-25 | Payer: MEDICARE

## 2023-07-27 ENCOUNTER — Ambulatory Visit: Admit: 2023-07-27 | Discharge: 2023-07-28 | Payer: MEDICARE

## 2023-07-28 DIAGNOSIS — B9689 Other specified bacterial agents as the cause of diseases classified elsewhere: Principal | ICD-10-CM

## 2023-07-28 DIAGNOSIS — N390 Urinary tract infection, site not specified: Principal | ICD-10-CM

## 2023-07-28 MED ORDER — CEPHALEXIN 250 MG CAPSULE
ORAL_CAPSULE | Freq: Two times a day (BID) | ORAL | 0 refills | 7 days | Status: CP
Start: 2023-07-28 — End: 2023-08-04

## 2023-07-29 MED ORDER — LEVOTHYROXINE 88 MCG TABLET
ORAL_TABLET | Freq: Every day | ORAL | 0 refills | 30 days
Start: 2023-07-29 — End: 2023-08-28

## 2023-07-31 MED ORDER — LEVOTHYROXINE 88 MCG TABLET
ORAL_TABLET | Freq: Every day | ORAL | 0 refills | 30 days
Start: 2023-07-31 — End: 2023-08-30

## 2023-07-31 MED ORDER — LAMOTRIGINE 150 MG TABLET
ORAL_TABLET | Freq: Every day | ORAL | 0 refills | 30 days
Start: 2023-07-31 — End: 2023-08-30

## 2023-07-31 MED ORDER — SERTRALINE 50 MG TABLET
ORAL_TABLET | Freq: Every day | ORAL | 0 refills | 30 days
Start: 2023-07-31 — End: 2023-08-30

## 2023-07-31 MED FILL — BRILINTA 90 MG TABLET: ORAL | 30 days supply | Qty: 60 | Fill #1

## 2023-08-03 DIAGNOSIS — I3481 Mitral valve annular calcification: Principal | ICD-10-CM

## 2023-08-03 DIAGNOSIS — Z951 Presence of aortocoronary bypass graft: Principal | ICD-10-CM

## 2023-08-03 DIAGNOSIS — I2510 Atherosclerotic heart disease of native coronary artery without angina pectoris: Principal | ICD-10-CM

## 2023-08-03 DIAGNOSIS — I739 Peripheral vascular disease, unspecified: Principal | ICD-10-CM

## 2023-08-08 DIAGNOSIS — N186 End stage renal disease: Principal | ICD-10-CM

## 2023-08-08 DIAGNOSIS — M150 Primary generalized (osteo)arthritis: Principal | ICD-10-CM

## 2023-08-08 DIAGNOSIS — B182 Chronic viral hepatitis C: Principal | ICD-10-CM

## 2023-08-08 DIAGNOSIS — E1142 Type 2 diabetes mellitus with diabetic polyneuropathy: Principal | ICD-10-CM

## 2023-08-08 DIAGNOSIS — E1122 Type 2 diabetes mellitus with diabetic chronic kidney disease: Principal | ICD-10-CM

## 2023-08-08 DIAGNOSIS — E1169 Type 2 diabetes mellitus with other specified complication: Principal | ICD-10-CM

## 2023-08-08 DIAGNOSIS — M86171 Other acute osteomyelitis, right ankle and foot: Principal | ICD-10-CM

## 2023-08-08 DIAGNOSIS — G894 Chronic pain syndrome: Principal | ICD-10-CM

## 2023-08-08 DIAGNOSIS — I5043 Acute on chronic combined systolic (congestive) and diastolic (congestive) heart failure: Principal | ICD-10-CM

## 2023-08-08 DIAGNOSIS — Z992 Dependence on renal dialysis: Principal | ICD-10-CM

## 2023-08-08 DIAGNOSIS — E11621 Type 2 diabetes mellitus with foot ulcer: Principal | ICD-10-CM

## 2023-08-08 DIAGNOSIS — E890 Postprocedural hypothyroidism: Principal | ICD-10-CM

## 2023-08-08 DIAGNOSIS — L97412 Non-pressure chronic ulcer of right heel and midfoot with fat layer exposed: Principal | ICD-10-CM

## 2023-08-08 DIAGNOSIS — I132 Hypertensive heart and chronic kidney disease with heart failure and with stage 5 chronic kidney disease, or end stage renal disease: Principal | ICD-10-CM

## 2023-08-08 DIAGNOSIS — Z48812 Encounter for surgical aftercare following surgery on the circulatory system: Principal | ICD-10-CM

## 2023-08-08 DIAGNOSIS — E1143 Type 2 diabetes mellitus with diabetic autonomic (poly)neuropathy: Principal | ICD-10-CM

## 2023-08-08 DIAGNOSIS — I081 Rheumatic disorders of both mitral and tricuspid valves: Principal | ICD-10-CM

## 2023-08-08 DIAGNOSIS — I2510 Atherosclerotic heart disease of native coronary artery without angina pectoris: Principal | ICD-10-CM

## 2023-08-08 DIAGNOSIS — I272 Pulmonary hypertension, unspecified: Principal | ICD-10-CM

## 2023-08-08 DIAGNOSIS — I70203 Unspecified atherosclerosis of native arteries of extremities, bilateral legs: Principal | ICD-10-CM

## 2023-08-08 DIAGNOSIS — E1151 Type 2 diabetes mellitus with diabetic peripheral angiopathy without gangrene: Principal | ICD-10-CM

## 2023-08-08 DIAGNOSIS — D631 Anemia in chronic kidney disease: Principal | ICD-10-CM

## 2023-08-08 DIAGNOSIS — F319 Bipolar disorder, unspecified: Principal | ICD-10-CM

## 2023-08-08 DIAGNOSIS — E871 Hypo-osmolality and hyponatremia: Principal | ICD-10-CM

## 2023-08-08 DIAGNOSIS — F419 Anxiety disorder, unspecified: Principal | ICD-10-CM

## 2023-08-08 MED ORDER — LEVOTHYROXINE 88 MCG TABLET
ORAL_TABLET | Freq: Every day | ORAL | 0 refills | 30 days
Start: 2023-08-08 — End: 2023-09-07

## 2023-08-08 MED ORDER — SERTRALINE 50 MG TABLET
ORAL_TABLET | Freq: Every day | ORAL | 0 refills | 30 days
Start: 2023-08-08 — End: 2023-09-07

## 2023-08-08 MED ORDER — LAMOTRIGINE 150 MG TABLET
ORAL_TABLET | Freq: Every day | ORAL | 0 refills | 30 days
Start: 2023-08-08 — End: 2023-09-07

## 2023-08-10 ENCOUNTER — Ambulatory Visit: Admit: 2023-08-10 | Discharge: 2023-08-11 | Payer: MEDICARE

## 2023-08-10 DIAGNOSIS — I081 Rheumatic disorders of both mitral and tricuspid valves: Principal | ICD-10-CM

## 2023-08-10 DIAGNOSIS — I739 Peripheral vascular disease, unspecified: Principal | ICD-10-CM

## 2023-08-10 DIAGNOSIS — F319 Bipolar disorder, unspecified: Principal | ICD-10-CM

## 2023-08-10 DIAGNOSIS — E11621 Type 2 diabetes mellitus with foot ulcer: Principal | ICD-10-CM

## 2023-08-10 DIAGNOSIS — Z48812 Encounter for surgical aftercare following surgery on the circulatory system: Principal | ICD-10-CM

## 2023-08-10 DIAGNOSIS — L97412 Non-pressure chronic ulcer of right heel and midfoot with fat layer exposed: Principal | ICD-10-CM

## 2023-08-10 DIAGNOSIS — E1169 Type 2 diabetes mellitus with other specified complication: Principal | ICD-10-CM

## 2023-08-10 DIAGNOSIS — E1151 Type 2 diabetes mellitus with diabetic peripheral angiopathy without gangrene: Principal | ICD-10-CM

## 2023-08-10 DIAGNOSIS — I2510 Atherosclerotic heart disease of native coronary artery without angina pectoris: Principal | ICD-10-CM

## 2023-08-10 DIAGNOSIS — E1122 Type 2 diabetes mellitus with diabetic chronic kidney disease: Principal | ICD-10-CM

## 2023-08-10 DIAGNOSIS — E1142 Type 2 diabetes mellitus with diabetic polyneuropathy: Principal | ICD-10-CM

## 2023-08-10 DIAGNOSIS — Z992 Dependence on renal dialysis: Principal | ICD-10-CM

## 2023-08-10 DIAGNOSIS — M150 Primary generalized (osteo)arthritis: Principal | ICD-10-CM

## 2023-08-10 DIAGNOSIS — E890 Postprocedural hypothyroidism: Principal | ICD-10-CM

## 2023-08-10 DIAGNOSIS — I5043 Acute on chronic combined systolic (congestive) and diastolic (congestive) heart failure: Principal | ICD-10-CM

## 2023-08-10 DIAGNOSIS — Z951 Presence of aortocoronary bypass graft: Principal | ICD-10-CM

## 2023-08-10 DIAGNOSIS — G894 Chronic pain syndrome: Principal | ICD-10-CM

## 2023-08-10 DIAGNOSIS — F419 Anxiety disorder, unspecified: Principal | ICD-10-CM

## 2023-08-10 DIAGNOSIS — B182 Chronic viral hepatitis C: Principal | ICD-10-CM

## 2023-08-10 DIAGNOSIS — I272 Pulmonary hypertension, unspecified: Principal | ICD-10-CM

## 2023-08-10 DIAGNOSIS — E871 Hypo-osmolality and hyponatremia: Principal | ICD-10-CM

## 2023-08-10 DIAGNOSIS — N186 End stage renal disease: Principal | ICD-10-CM

## 2023-08-10 DIAGNOSIS — I70203 Unspecified atherosclerosis of native arteries of extremities, bilateral legs: Principal | ICD-10-CM

## 2023-08-10 DIAGNOSIS — E1143 Type 2 diabetes mellitus with diabetic autonomic (poly)neuropathy: Principal | ICD-10-CM

## 2023-08-10 DIAGNOSIS — M86171 Other acute osteomyelitis, right ankle and foot: Principal | ICD-10-CM

## 2023-08-10 DIAGNOSIS — D631 Anemia in chronic kidney disease: Principal | ICD-10-CM

## 2023-08-10 DIAGNOSIS — I132 Hypertensive heart and chronic kidney disease with heart failure and with stage 5 chronic kidney disease, or end stage renal disease: Principal | ICD-10-CM

## 2023-08-11 ENCOUNTER — Ambulatory Visit: Admit: 2023-08-11 | Discharge: 2023-08-11 | Payer: MEDICARE

## 2023-08-11 ENCOUNTER — Encounter: Admit: 2023-08-11 | Discharge: 2023-08-11 | Payer: MEDICARE | Attending: Anesthesiology | Primary: Anesthesiology

## 2023-08-11 DIAGNOSIS — I739 Peripheral vascular disease, unspecified: Principal | ICD-10-CM

## 2023-08-11 DIAGNOSIS — L97409 Non-pressure chronic ulcer of unspecified heel and midfoot with unspecified severity: Principal | ICD-10-CM

## 2023-08-15 ENCOUNTER — Ambulatory Visit: Admit: 2023-08-15 | Discharge: 2023-08-16 | Payer: MEDICARE

## 2023-08-15 ENCOUNTER — Telehealth: Admit: 2023-08-15 | Discharge: 2023-08-16 | Payer: MEDICARE | Attending: Vascular Surgery | Primary: Vascular Surgery

## 2023-08-15 ENCOUNTER — Ambulatory Visit
Admit: 2023-08-15 | Discharge: 2023-08-16 | Payer: MEDICARE | Attending: Student in an Organized Health Care Education/Training Program | Primary: Student in an Organized Health Care Education/Training Program

## 2023-08-15 DIAGNOSIS — I272 Pulmonary hypertension, unspecified: Principal | ICD-10-CM

## 2023-08-15 DIAGNOSIS — F419 Anxiety disorder, unspecified: Principal | ICD-10-CM

## 2023-08-15 DIAGNOSIS — E1122 Type 2 diabetes mellitus with diabetic chronic kidney disease: Principal | ICD-10-CM

## 2023-08-15 DIAGNOSIS — L97519 Non-pressure chronic ulcer of other part of right foot with unspecified severity: Principal | ICD-10-CM

## 2023-08-15 DIAGNOSIS — M86171 Other acute osteomyelitis, right ankle and foot: Principal | ICD-10-CM

## 2023-08-15 DIAGNOSIS — E1142 Type 2 diabetes mellitus with diabetic polyneuropathy: Principal | ICD-10-CM

## 2023-08-15 DIAGNOSIS — I2510 Atherosclerotic heart disease of native coronary artery without angina pectoris: Principal | ICD-10-CM

## 2023-08-15 DIAGNOSIS — L97509 Non-pressure chronic ulcer of other part of unspecified foot with unspecified severity: Principal | ICD-10-CM

## 2023-08-15 DIAGNOSIS — E1169 Type 2 diabetes mellitus with other specified complication: Principal | ICD-10-CM

## 2023-08-15 DIAGNOSIS — B182 Chronic viral hepatitis C: Principal | ICD-10-CM

## 2023-08-15 DIAGNOSIS — E11621 Type 2 diabetes mellitus with foot ulcer: Principal | ICD-10-CM

## 2023-08-15 DIAGNOSIS — L97413 Non-pressure chronic ulcer of right heel and midfoot with necrosis of muscle: Principal | ICD-10-CM

## 2023-08-15 DIAGNOSIS — F319 Bipolar disorder, unspecified: Principal | ICD-10-CM

## 2023-08-15 DIAGNOSIS — I739 Peripheral vascular disease, unspecified: Principal | ICD-10-CM

## 2023-08-15 DIAGNOSIS — E871 Hypo-osmolality and hyponatremia: Principal | ICD-10-CM

## 2023-08-15 DIAGNOSIS — I5043 Acute on chronic combined systolic (congestive) and diastolic (congestive) heart failure: Principal | ICD-10-CM

## 2023-08-15 DIAGNOSIS — L97409 Non-pressure chronic ulcer of unspecified heel and midfoot with unspecified severity: Principal | ICD-10-CM

## 2023-08-15 DIAGNOSIS — I70203 Unspecified atherosclerosis of native arteries of extremities, bilateral legs: Principal | ICD-10-CM

## 2023-08-15 DIAGNOSIS — L97412 Non-pressure chronic ulcer of right heel and midfoot with fat layer exposed: Principal | ICD-10-CM

## 2023-08-15 DIAGNOSIS — I132 Hypertensive heart and chronic kidney disease with heart failure and with stage 5 chronic kidney disease, or end stage renal disease: Principal | ICD-10-CM

## 2023-08-15 DIAGNOSIS — G894 Chronic pain syndrome: Principal | ICD-10-CM

## 2023-08-15 DIAGNOSIS — E1143 Type 2 diabetes mellitus with diabetic autonomic (poly)neuropathy: Principal | ICD-10-CM

## 2023-08-15 DIAGNOSIS — M150 Primary generalized (osteo)arthritis: Principal | ICD-10-CM

## 2023-08-15 DIAGNOSIS — Z48812 Encounter for surgical aftercare following surgery on the circulatory system: Principal | ICD-10-CM

## 2023-08-15 DIAGNOSIS — N186 End stage renal disease: Principal | ICD-10-CM

## 2023-08-15 DIAGNOSIS — Z992 Dependence on renal dialysis: Principal | ICD-10-CM

## 2023-08-15 DIAGNOSIS — E1151 Type 2 diabetes mellitus with diabetic peripheral angiopathy without gangrene: Principal | ICD-10-CM

## 2023-08-15 DIAGNOSIS — D631 Anemia in chronic kidney disease: Principal | ICD-10-CM

## 2023-08-15 DIAGNOSIS — I081 Rheumatic disorders of both mitral and tricuspid valves: Principal | ICD-10-CM

## 2023-08-15 DIAGNOSIS — E890 Postprocedural hypothyroidism: Principal | ICD-10-CM

## 2023-08-15 MED ORDER — SILVER SULFADIAZINE 1 % TOPICAL CREAM
Freq: Every day | TOPICAL | 3 refills | 0 days | Status: CP
Start: 2023-08-15 — End: ?

## 2023-08-15 MED ORDER — DOXYCYCLINE HYCLATE 100 MG TABLET
ORAL_TABLET | Freq: Two times a day (BID) | ORAL | 0 refills | 14 days | Status: CP
Start: 2023-08-15 — End: 2023-08-29

## 2023-08-17 ENCOUNTER — Ambulatory Visit: Admit: 2023-08-17 | Discharge: 2023-08-18 | Payer: MEDICARE

## 2023-08-17 DIAGNOSIS — I10 Essential (primary) hypertension: Principal | ICD-10-CM

## 2023-08-17 DIAGNOSIS — I240 Acute coronary thrombosis not resulting in myocardial infarction: Principal | ICD-10-CM

## 2023-08-17 DIAGNOSIS — I259 Chronic ischemic heart disease, unspecified: Principal | ICD-10-CM

## 2023-08-17 DIAGNOSIS — I350 Nonrheumatic aortic (valve) stenosis: Principal | ICD-10-CM

## 2023-08-17 DIAGNOSIS — N186 End stage renal disease: Principal | ICD-10-CM

## 2023-08-23 DIAGNOSIS — G2581 Restless legs syndrome: Principal | ICD-10-CM

## 2023-08-23 MED ORDER — GABAPENTIN 300 MG CAPSULE
ORAL_CAPSULE | Freq: Every evening | ORAL | 1 refills | 90 days | Status: CP
Start: 2023-08-23 — End: 2024-08-22

## 2023-08-24 ENCOUNTER — Encounter: Admit: 2023-08-24 | Payer: MEDICARE

## 2023-08-25 ENCOUNTER — Encounter
Admit: 2023-08-25 | Discharge: 2023-08-29 | Disposition: A | Discharge status: Home Health | Payer: MEDICARE | Admitting: Vascular Surgery

## 2023-08-25 ENCOUNTER — Encounter
Admit: 2023-08-25 | Discharge: 2023-08-29 | Disposition: A | Discharge status: Home Health | Payer: MEDICARE | Attending: Anesthesiology | Admitting: Vascular Surgery

## 2023-08-25 ENCOUNTER — Ambulatory Visit
Admit: 2023-08-25 | Discharge: 2023-08-29 | Disposition: A | Discharge status: Home Health | Payer: MEDICARE | Admitting: Vascular Surgery

## 2023-08-29 MED ORDER — LEVOTHYROXINE 75 MCG TABLET
ORAL_TABLET | Freq: Every day | ORAL | 0 refills | 30 days | Status: CP
Start: 2023-08-29 — End: 2023-09-28
  Filled 2023-08-29: qty 30, 30d supply, fill #0

## 2023-08-29 MED ORDER — MICONAZOLE NITRATE 2 % VAGINAL CREAM
Freq: Every evening | VAGINAL | 0 refills | 7 days | Status: CN
Start: 2023-08-29 — End: 2023-09-05

## 2023-08-29 MED ORDER — DOXYCYCLINE HYCLATE 100 MG TABLET
ORAL_TABLET | Freq: Two times a day (BID) | ORAL | 0 refills | 7 days | Status: CP
Start: 2023-08-29 — End: 2023-09-05
  Filled 2023-08-29: qty 14, 7d supply, fill #0

## 2023-08-29 MED ORDER — OXYCODONE 10 MG TABLET
ORAL_TABLET | ORAL | 0 refills | 2 days | Status: CP | PRN
Start: 2023-08-29 — End: 2023-09-03
  Filled 2023-08-29: qty 10, 2d supply, fill #0

## 2023-08-31 ENCOUNTER — Encounter: Admit: 2023-08-31 | Payer: MEDICARE

## 2023-08-31 ENCOUNTER — Encounter: Admit: 2023-08-31 | Discharge: 2023-09-29 | Payer: MEDICARE

## 2023-08-31 ENCOUNTER — Ambulatory Visit: Admit: 2023-08-31 | Payer: MEDICARE

## 2023-08-31 ENCOUNTER — Inpatient Hospital Stay: Admit: 2023-08-31 | Payer: MEDICARE

## 2023-09-05 ENCOUNTER — Ambulatory Visit: Admit: 2023-09-05 | Discharge: 2023-09-06 | Payer: MEDICARE | Attending: Vascular Surgery | Primary: Vascular Surgery

## 2023-09-05 DIAGNOSIS — R110 Nausea: Principal | ICD-10-CM

## 2023-09-05 MED ORDER — LAMOTRIGINE 150 MG TABLET
ORAL_TABLET | ORAL | 0 refills | 0 days
Start: 2023-09-05 — End: ?

## 2023-09-05 MED ORDER — SERTRALINE 50 MG TABLET
ORAL_TABLET | 0 refills | 0 days
Start: 2023-09-05 — End: ?

## 2023-09-05 MED ORDER — ONDANSETRON HCL 4 MG TABLET
ORAL_TABLET | 0 refills | 0 days
Start: 2023-09-05 — End: ?

## 2023-09-06 MED ORDER — ONDANSETRON HCL 4 MG TABLET
ORAL_TABLET | 0 refills | 0 days | Status: CP
Start: 2023-09-06 — End: ?

## 2023-09-06 MED ORDER — SERTRALINE 50 MG TABLET
ORAL_TABLET | 0 refills | 0 days
Start: 2023-09-06 — End: ?

## 2023-09-06 MED ORDER — LAMOTRIGINE 150 MG TABLET
ORAL_TABLET | ORAL | 0 refills | 0 days
Start: 2023-09-06 — End: ?

## 2023-09-07 ENCOUNTER — Ambulatory Visit: Admit: 2023-09-07 | Discharge: 2023-09-08 | Payer: MEDICARE | Attending: Family | Primary: Family

## 2023-09-07 DIAGNOSIS — R0902 Hypoxemia: Principal | ICD-10-CM

## 2023-09-07 DIAGNOSIS — R5383 Other fatigue: Principal | ICD-10-CM

## 2023-09-07 MED ORDER — HYDROCODONE 5 MG-ACETAMINOPHEN 325 MG TABLET
ORAL_TABLET | ORAL | 0 refills | 5 days | Status: CP | PRN
Start: 2023-09-07 — End: ?

## 2023-09-12 ENCOUNTER — Ambulatory Visit: Admit: 2023-09-12 | Payer: MEDICARE

## 2023-09-14 DIAGNOSIS — E11621 Type 2 diabetes mellitus with foot ulcer: Principal | ICD-10-CM

## 2023-09-14 DIAGNOSIS — E1165 Type 2 diabetes mellitus with hyperglycemia: Principal | ICD-10-CM

## 2023-09-14 DIAGNOSIS — I132 Hypertensive heart and chronic kidney disease with heart failure and with stage 5 chronic kidney disease, or end stage renal disease: Principal | ICD-10-CM

## 2023-09-14 DIAGNOSIS — N186 End stage renal disease: Principal | ICD-10-CM

## 2023-09-14 DIAGNOSIS — F319 Bipolar disorder, unspecified: Principal | ICD-10-CM

## 2023-09-14 DIAGNOSIS — Z9884 Bariatric surgery status: Principal | ICD-10-CM

## 2023-09-14 DIAGNOSIS — L089 Local infection of the skin and subcutaneous tissue, unspecified: Principal | ICD-10-CM

## 2023-09-14 DIAGNOSIS — E890 Postprocedural hypothyroidism: Principal | ICD-10-CM

## 2023-09-14 DIAGNOSIS — Z48812 Encounter for surgical aftercare following surgery on the circulatory system: Principal | ICD-10-CM

## 2023-09-14 DIAGNOSIS — Z951 Presence of aortocoronary bypass graft: Principal | ICD-10-CM

## 2023-09-14 DIAGNOSIS — E1151 Type 2 diabetes mellitus with diabetic peripheral angiopathy without gangrene: Principal | ICD-10-CM

## 2023-09-14 DIAGNOSIS — M5136 Other intervertebral disc degeneration, lumbar region: Principal | ICD-10-CM

## 2023-09-14 DIAGNOSIS — E785 Hyperlipidemia, unspecified: Principal | ICD-10-CM

## 2023-09-14 DIAGNOSIS — B182 Chronic viral hepatitis C: Principal | ICD-10-CM

## 2023-09-14 DIAGNOSIS — I5042 Chronic combined systolic (congestive) and diastolic (congestive) heart failure: Principal | ICD-10-CM

## 2023-09-14 DIAGNOSIS — I2510 Atherosclerotic heart disease of native coronary artery without angina pectoris: Principal | ICD-10-CM

## 2023-09-14 DIAGNOSIS — G40909 Epilepsy, unspecified, not intractable, without status epilepticus: Principal | ICD-10-CM

## 2023-09-14 DIAGNOSIS — L97412 Non-pressure chronic ulcer of right heel and midfoot with fat layer exposed: Principal | ICD-10-CM

## 2023-09-14 DIAGNOSIS — B961 Klebsiella pneumoniae [K. pneumoniae] as the cause of diseases classified elsewhere: Principal | ICD-10-CM

## 2023-09-14 DIAGNOSIS — G894 Chronic pain syndrome: Principal | ICD-10-CM

## 2023-09-14 DIAGNOSIS — G43909 Migraine, unspecified, not intractable, without status migrainosus: Principal | ICD-10-CM

## 2023-09-14 DIAGNOSIS — E1122 Type 2 diabetes mellitus with diabetic chronic kidney disease: Principal | ICD-10-CM

## 2023-09-14 DIAGNOSIS — M1990 Unspecified osteoarthritis, unspecified site: Principal | ICD-10-CM

## 2023-09-14 DIAGNOSIS — D631 Anemia in chronic kidney disease: Principal | ICD-10-CM

## 2023-09-14 DIAGNOSIS — I252 Old myocardial infarction: Principal | ICD-10-CM

## 2023-09-14 MED ORDER — SERTRALINE 50 MG TABLET
ORAL_TABLET | Freq: Every day | ORAL | 1 refills | 90 days
Start: 2023-09-14 — End: 2024-09-13

## 2023-09-14 MED ORDER — LAMOTRIGINE 150 MG TABLET
ORAL_TABLET | Freq: Every day | ORAL | 1 refills | 90 days
Start: 2023-09-14 — End: 2024-09-13

## 2023-09-14 MED ORDER — BUMETANIDE 2 MG TABLET
ORAL_TABLET | Freq: Every day | ORAL | 1 refills | 90 days
Start: 2023-09-14 — End: 2024-09-13

## 2023-09-14 MED ORDER — ATORVASTATIN 80 MG TABLET
ORAL_TABLET | Freq: Every day | ORAL | 1 refills | 90 days
Start: 2023-09-14 — End: 2024-09-13

## 2023-09-15 DIAGNOSIS — U071 COVID-19 determined by clinical diagnostic criteria: Principal | ICD-10-CM

## 2023-09-15 MED ORDER — SERTRALINE 50 MG TABLET
ORAL_TABLET | Freq: Every day | ORAL | 1 refills | 90 days | Status: CP
Start: 2023-09-15 — End: 2024-09-14

## 2023-09-15 MED ORDER — ATORVASTATIN 80 MG TABLET
ORAL | 1 refills | 90 days | Status: CP
Start: 2023-09-15 — End: 2024-09-14

## 2023-09-15 MED ORDER — BUMETANIDE 2 MG TABLET
ORAL_TABLET | Freq: Every day | ORAL | 1 refills | 90 days | Status: CP
Start: 2023-09-15 — End: 2024-09-14

## 2023-09-15 MED ORDER — LAMOTRIGINE 150 MG TABLET
ORAL_TABLET | Freq: Every day | ORAL | 1 refills | 90 days | Status: CP
Start: 2023-09-15 — End: 2024-09-14

## 2023-09-16 ENCOUNTER — Telehealth: Admit: 2023-09-16 | Discharge: 2023-09-17 | Payer: MEDICARE

## 2023-09-16 DIAGNOSIS — R059 Cough, unspecified type: Principal | ICD-10-CM

## 2023-09-16 DIAGNOSIS — J329 Chronic sinusitis, unspecified: Principal | ICD-10-CM

## 2023-09-16 MED ORDER — FLUTICASONE PROPIONATE 50 MCG/ACTUATION NASAL SPRAY,SUSPENSION
Freq: Every day | NASAL | 0 refills | 120 days | Status: CP
Start: 2023-09-16 — End: 2024-09-15

## 2023-09-16 MED ORDER — HYDROCODONE 10 MG-CHLORPHENIRAMINE 8 MG/5 ML ORAL SUSP EXTEND.REL 12HR
Freq: Two times a day (BID) | ORAL | 0 refills | 8 days | Status: CP | PRN
Start: 2023-09-16 — End: ?

## 2023-09-16 MED ORDER — AMOXICILLIN 500 MG CAPSULE
ORAL_CAPSULE | Freq: Two times a day (BID) | ORAL | 0 refills | 7 days | Status: CP
Start: 2023-09-16 — End: ?

## 2023-09-18 DIAGNOSIS — R059 Cough, unspecified type: Principal | ICD-10-CM

## 2023-09-18 MED ORDER — TICAGRELOR 90 MG TABLET
ORAL_TABLET | Freq: Two times a day (BID) | ORAL | 2 refills | 30 days | Status: CP
Start: 2023-09-18 — End: ?

## 2023-09-18 MED ORDER — GUAIFENESIN ER 600 MG TABLET, EXTENDED RELEASE 12 HR
ORAL_TABLET | Freq: Two times a day (BID) | ORAL | 0 refills | 15 days | Status: CP
Start: 2023-09-18 — End: ?

## 2023-09-19 DIAGNOSIS — E11621 Type 2 diabetes mellitus with foot ulcer: Principal | ICD-10-CM

## 2023-09-19 DIAGNOSIS — I739 Peripheral vascular disease, unspecified: Principal | ICD-10-CM

## 2023-09-19 DIAGNOSIS — L97519 Non-pressure chronic ulcer of other part of right foot with unspecified severity: Principal | ICD-10-CM

## 2023-09-19 DIAGNOSIS — L97413 Non-pressure chronic ulcer of right heel and midfoot with necrosis of muscle: Principal | ICD-10-CM

## 2023-09-19 MED ORDER — SILVER SULFADIAZINE 1 % TOPICAL CREAM
Freq: Every day | TOPICAL | 3 refills | 0 days | Status: CP
Start: 2023-09-19 — End: 2024-09-18

## 2023-09-21 ENCOUNTER — Ambulatory Visit: Admit: 2023-09-21 | Discharge: 2023-09-22 | Payer: MEDICARE

## 2023-09-21 DIAGNOSIS — I5042 Chronic combined systolic (congestive) and diastolic (congestive) heart failure: Principal | ICD-10-CM

## 2023-09-21 DIAGNOSIS — Z9884 Bariatric surgery status: Principal | ICD-10-CM

## 2023-09-21 DIAGNOSIS — G40909 Epilepsy, unspecified, not intractable, without status epilepticus: Principal | ICD-10-CM

## 2023-09-21 DIAGNOSIS — E890 Postprocedural hypothyroidism: Principal | ICD-10-CM

## 2023-09-21 DIAGNOSIS — I739 Peripheral vascular disease, unspecified: Principal | ICD-10-CM

## 2023-09-21 DIAGNOSIS — E11621 Type 2 diabetes mellitus with foot ulcer: Principal | ICD-10-CM

## 2023-09-21 DIAGNOSIS — Z48812 Encounter for surgical aftercare following surgery on the circulatory system: Principal | ICD-10-CM

## 2023-09-21 DIAGNOSIS — L089 Local infection of the skin and subcutaneous tissue, unspecified: Principal | ICD-10-CM

## 2023-09-21 DIAGNOSIS — B961 Klebsiella pneumoniae [K. pneumoniae] as the cause of diseases classified elsewhere: Principal | ICD-10-CM

## 2023-09-21 DIAGNOSIS — E1165 Type 2 diabetes mellitus with hyperglycemia: Principal | ICD-10-CM

## 2023-09-21 DIAGNOSIS — L97411 Non-pressure chronic ulcer of right heel and midfoot limited to breakdown of skin: Principal | ICD-10-CM

## 2023-09-21 DIAGNOSIS — Z951 Presence of aortocoronary bypass graft: Principal | ICD-10-CM

## 2023-09-21 DIAGNOSIS — N186 End stage renal disease: Principal | ICD-10-CM

## 2023-09-21 DIAGNOSIS — E785 Hyperlipidemia, unspecified: Principal | ICD-10-CM

## 2023-09-21 DIAGNOSIS — B182 Chronic viral hepatitis C: Principal | ICD-10-CM

## 2023-09-21 DIAGNOSIS — D631 Anemia in chronic kidney disease: Principal | ICD-10-CM

## 2023-09-21 DIAGNOSIS — I2510 Atherosclerotic heart disease of native coronary artery without angina pectoris: Principal | ICD-10-CM

## 2023-09-21 DIAGNOSIS — L97412 Non-pressure chronic ulcer of right heel and midfoot with fat layer exposed: Principal | ICD-10-CM

## 2023-09-21 DIAGNOSIS — I132 Hypertensive heart and chronic kidney disease with heart failure and with stage 5 chronic kidney disease, or end stage renal disease: Principal | ICD-10-CM

## 2023-09-21 DIAGNOSIS — M5136 Other intervertebral disc degeneration, lumbar region: Principal | ICD-10-CM

## 2023-09-21 DIAGNOSIS — G43909 Migraine, unspecified, not intractable, without status migrainosus: Principal | ICD-10-CM

## 2023-09-21 DIAGNOSIS — M1990 Unspecified osteoarthritis, unspecified site: Principal | ICD-10-CM

## 2023-09-21 DIAGNOSIS — I252 Old myocardial infarction: Principal | ICD-10-CM

## 2023-09-21 DIAGNOSIS — E1151 Type 2 diabetes mellitus with diabetic peripheral angiopathy without gangrene: Principal | ICD-10-CM

## 2023-09-21 DIAGNOSIS — E1122 Type 2 diabetes mellitus with diabetic chronic kidney disease: Principal | ICD-10-CM

## 2023-09-21 DIAGNOSIS — F319 Bipolar disorder, unspecified: Principal | ICD-10-CM

## 2023-09-21 DIAGNOSIS — G894 Chronic pain syndrome: Principal | ICD-10-CM

## 2023-09-22 DIAGNOSIS — Z951 Presence of aortocoronary bypass graft: Principal | ICD-10-CM

## 2023-09-22 DIAGNOSIS — E890 Postprocedural hypothyroidism: Principal | ICD-10-CM

## 2023-09-22 DIAGNOSIS — F319 Bipolar disorder, unspecified: Principal | ICD-10-CM

## 2023-09-22 DIAGNOSIS — I132 Hypertensive heart and chronic kidney disease with heart failure and with stage 5 chronic kidney disease, or end stage renal disease: Principal | ICD-10-CM

## 2023-09-22 DIAGNOSIS — I5042 Chronic combined systolic (congestive) and diastolic (congestive) heart failure: Principal | ICD-10-CM

## 2023-09-22 DIAGNOSIS — G43909 Migraine, unspecified, not intractable, without status migrainosus: Principal | ICD-10-CM

## 2023-09-22 DIAGNOSIS — E1151 Type 2 diabetes mellitus with diabetic peripheral angiopathy without gangrene: Principal | ICD-10-CM

## 2023-09-22 DIAGNOSIS — E11621 Type 2 diabetes mellitus with foot ulcer: Principal | ICD-10-CM

## 2023-09-22 DIAGNOSIS — M5136 Other intervertebral disc degeneration, lumbar region: Principal | ICD-10-CM

## 2023-09-22 DIAGNOSIS — I252 Old myocardial infarction: Principal | ICD-10-CM

## 2023-09-22 DIAGNOSIS — G894 Chronic pain syndrome: Principal | ICD-10-CM

## 2023-09-22 DIAGNOSIS — E1122 Type 2 diabetes mellitus with diabetic chronic kidney disease: Principal | ICD-10-CM

## 2023-09-22 DIAGNOSIS — G40909 Epilepsy, unspecified, not intractable, without status epilepticus: Principal | ICD-10-CM

## 2023-09-22 DIAGNOSIS — Z9884 Bariatric surgery status: Principal | ICD-10-CM

## 2023-09-22 DIAGNOSIS — B961 Klebsiella pneumoniae [K. pneumoniae] as the cause of diseases classified elsewhere: Principal | ICD-10-CM

## 2023-09-22 DIAGNOSIS — I2510 Atherosclerotic heart disease of native coronary artery without angina pectoris: Principal | ICD-10-CM

## 2023-09-22 DIAGNOSIS — Z48812 Encounter for surgical aftercare following surgery on the circulatory system: Principal | ICD-10-CM

## 2023-09-22 DIAGNOSIS — L97412 Non-pressure chronic ulcer of right heel and midfoot with fat layer exposed: Principal | ICD-10-CM

## 2023-09-22 DIAGNOSIS — N186 End stage renal disease: Principal | ICD-10-CM

## 2023-09-22 DIAGNOSIS — D631 Anemia in chronic kidney disease: Principal | ICD-10-CM

## 2023-09-22 DIAGNOSIS — B182 Chronic viral hepatitis C: Principal | ICD-10-CM

## 2023-09-22 DIAGNOSIS — L089 Local infection of the skin and subcutaneous tissue, unspecified: Principal | ICD-10-CM

## 2023-09-22 DIAGNOSIS — M1990 Unspecified osteoarthritis, unspecified site: Principal | ICD-10-CM

## 2023-09-22 DIAGNOSIS — E785 Hyperlipidemia, unspecified: Principal | ICD-10-CM

## 2023-09-22 DIAGNOSIS — E1165 Type 2 diabetes mellitus with hyperglycemia: Principal | ICD-10-CM

## 2023-09-23 ENCOUNTER — Telehealth: Admit: 2023-09-23 | Discharge: 2023-09-24 | Payer: MEDICARE

## 2023-09-23 DIAGNOSIS — R059 Cough, unspecified type: Principal | ICD-10-CM

## 2023-09-23 MED ORDER — BENZONATATE 100 MG CAPSULE
ORAL_CAPSULE | Freq: Four times a day (QID) | ORAL | 1 refills | 8 days | Status: CP | PRN
Start: 2023-09-23 — End: 2024-09-22

## 2023-09-23 MED ORDER — HYDROCODONE 10 MG-CHLORPHENIRAMINE 8 MG/5 ML ORAL SUSP EXTEND.REL 12HR
Freq: Two times a day (BID) | ORAL | 0 refills | 5 days | Status: CP | PRN
Start: 2023-09-23 — End: ?

## 2023-09-26 DIAGNOSIS — I5042 Chronic combined systolic (congestive) and diastolic (congestive) heart failure: Principal | ICD-10-CM

## 2023-09-26 DIAGNOSIS — L97412 Non-pressure chronic ulcer of right heel and midfoot with fat layer exposed: Principal | ICD-10-CM

## 2023-09-26 DIAGNOSIS — Z9884 Bariatric surgery status: Principal | ICD-10-CM

## 2023-09-26 DIAGNOSIS — B961 Klebsiella pneumoniae [K. pneumoniae] as the cause of diseases classified elsewhere: Principal | ICD-10-CM

## 2023-09-26 DIAGNOSIS — R0902 Hypoxemia: Principal | ICD-10-CM

## 2023-09-26 DIAGNOSIS — E1122 Type 2 diabetes mellitus with diabetic chronic kidney disease: Principal | ICD-10-CM

## 2023-09-26 DIAGNOSIS — D631 Anemia in chronic kidney disease: Principal | ICD-10-CM

## 2023-09-26 DIAGNOSIS — I132 Hypertensive heart and chronic kidney disease with heart failure and with stage 5 chronic kidney disease, or end stage renal disease: Principal | ICD-10-CM

## 2023-09-26 DIAGNOSIS — E785 Hyperlipidemia, unspecified: Principal | ICD-10-CM

## 2023-09-26 DIAGNOSIS — E1151 Type 2 diabetes mellitus with diabetic peripheral angiopathy without gangrene: Principal | ICD-10-CM

## 2023-09-26 DIAGNOSIS — F319 Bipolar disorder, unspecified: Principal | ICD-10-CM

## 2023-09-26 DIAGNOSIS — G43909 Migraine, unspecified, not intractable, without status migrainosus: Principal | ICD-10-CM

## 2023-09-26 DIAGNOSIS — Z48812 Encounter for surgical aftercare following surgery on the circulatory system: Principal | ICD-10-CM

## 2023-09-26 DIAGNOSIS — G40909 Epilepsy, unspecified, not intractable, without status epilepticus: Principal | ICD-10-CM

## 2023-09-26 DIAGNOSIS — N186 End stage renal disease: Principal | ICD-10-CM

## 2023-09-26 DIAGNOSIS — I252 Old myocardial infarction: Principal | ICD-10-CM

## 2023-09-26 DIAGNOSIS — L089 Local infection of the skin and subcutaneous tissue, unspecified: Principal | ICD-10-CM

## 2023-09-26 DIAGNOSIS — B182 Chronic viral hepatitis C: Principal | ICD-10-CM

## 2023-09-26 DIAGNOSIS — M1990 Unspecified osteoarthritis, unspecified site: Principal | ICD-10-CM

## 2023-09-26 DIAGNOSIS — R0602 Shortness of breath: Principal | ICD-10-CM

## 2023-09-26 DIAGNOSIS — E11621 Type 2 diabetes mellitus with foot ulcer: Principal | ICD-10-CM

## 2023-09-26 DIAGNOSIS — Z951 Presence of aortocoronary bypass graft: Principal | ICD-10-CM

## 2023-09-26 DIAGNOSIS — M5136 Other intervertebral disc degeneration, lumbar region: Principal | ICD-10-CM

## 2023-09-26 DIAGNOSIS — I2510 Atherosclerotic heart disease of native coronary artery without angina pectoris: Principal | ICD-10-CM

## 2023-09-26 DIAGNOSIS — E1165 Type 2 diabetes mellitus with hyperglycemia: Principal | ICD-10-CM

## 2023-09-26 DIAGNOSIS — R059 Cough, unspecified type: Principal | ICD-10-CM

## 2023-09-26 DIAGNOSIS — G894 Chronic pain syndrome: Principal | ICD-10-CM

## 2023-09-26 DIAGNOSIS — E890 Postprocedural hypothyroidism: Principal | ICD-10-CM

## 2023-09-26 MED ORDER — SYMBICORT 160 MCG-4.5 MCG/ACTUATION HFA AEROSOL INHALER
Freq: Two times a day (BID) | RESPIRATORY_TRACT | 3 refills | 31 days | Status: CP
Start: 2023-09-26 — End: 2024-09-25

## 2023-09-26 MED ORDER — IPRATROPIUM 0.5 MG-ALBUTEROL 3 MG (2.5 MG BASE)/3 ML NEBULIZATION SOLN
Freq: Four times a day (QID) | RESPIRATORY_TRACT | 1 refills | 5 days | Status: CP | PRN
Start: 2023-09-26 — End: 2024-09-25

## 2023-09-26 MED ORDER — BUDESONIDE-FORMOTEROL HFA 160 MCG-4.5 MCG/ACTUATION AEROSOL INHALER
Freq: Two times a day (BID) | RESPIRATORY_TRACT | 3 refills | 31 days | Status: CP
Start: 2023-09-26 — End: 2023-09-26

## 2023-09-27 MED ORDER — METOPROLOL SUCCINATE ER 50 MG TABLET,EXTENDED RELEASE 24 HR
ORAL_TABLET | 3 refills | 0 days
Start: 2023-09-27 — End: ?

## 2023-09-28 ENCOUNTER — Ambulatory Visit: Admit: 2023-09-28 | Discharge: 2023-09-29 | Payer: MEDICARE

## 2023-09-28 DIAGNOSIS — R399 Unspecified symptoms and signs involving the genitourinary system: Principal | ICD-10-CM

## 2023-09-28 DIAGNOSIS — I132 Hypertensive heart and chronic kidney disease with heart failure and with stage 5 chronic kidney disease, or end stage renal disease: Principal | ICD-10-CM

## 2023-09-28 DIAGNOSIS — I5042 Chronic combined systolic (congestive) and diastolic (congestive) heart failure: Principal | ICD-10-CM

## 2023-09-28 DIAGNOSIS — I252 Old myocardial infarction: Principal | ICD-10-CM

## 2023-09-28 DIAGNOSIS — E785 Hyperlipidemia, unspecified: Principal | ICD-10-CM

## 2023-09-28 DIAGNOSIS — E1165 Type 2 diabetes mellitus with hyperglycemia: Principal | ICD-10-CM

## 2023-09-28 DIAGNOSIS — I2510 Atherosclerotic heart disease of native coronary artery without angina pectoris: Principal | ICD-10-CM

## 2023-09-28 DIAGNOSIS — Z9884 Bariatric surgery status: Principal | ICD-10-CM

## 2023-09-28 DIAGNOSIS — Z48812 Encounter for surgical aftercare following surgery on the circulatory system: Principal | ICD-10-CM

## 2023-09-28 DIAGNOSIS — B961 Klebsiella pneumoniae [K. pneumoniae] as the cause of diseases classified elsewhere: Principal | ICD-10-CM

## 2023-09-28 DIAGNOSIS — R059 Cough, unspecified type: Principal | ICD-10-CM

## 2023-09-28 DIAGNOSIS — B182 Chronic viral hepatitis C: Principal | ICD-10-CM

## 2023-09-28 DIAGNOSIS — E1151 Type 2 diabetes mellitus with diabetic peripheral angiopathy without gangrene: Principal | ICD-10-CM

## 2023-09-28 DIAGNOSIS — Z951 Presence of aortocoronary bypass graft: Principal | ICD-10-CM

## 2023-09-28 DIAGNOSIS — M1990 Unspecified osteoarthritis, unspecified site: Principal | ICD-10-CM

## 2023-09-28 DIAGNOSIS — L97412 Non-pressure chronic ulcer of right heel and midfoot with fat layer exposed: Principal | ICD-10-CM

## 2023-09-28 DIAGNOSIS — G894 Chronic pain syndrome: Principal | ICD-10-CM

## 2023-09-28 DIAGNOSIS — N186 End stage renal disease: Principal | ICD-10-CM

## 2023-09-28 DIAGNOSIS — F319 Bipolar disorder, unspecified: Principal | ICD-10-CM

## 2023-09-28 DIAGNOSIS — E11621 Type 2 diabetes mellitus with foot ulcer: Principal | ICD-10-CM

## 2023-09-28 DIAGNOSIS — E890 Postprocedural hypothyroidism: Principal | ICD-10-CM

## 2023-09-28 DIAGNOSIS — L089 Local infection of the skin and subcutaneous tissue, unspecified: Principal | ICD-10-CM

## 2023-09-28 DIAGNOSIS — D631 Anemia in chronic kidney disease: Principal | ICD-10-CM

## 2023-09-28 DIAGNOSIS — E1122 Type 2 diabetes mellitus with diabetic chronic kidney disease: Principal | ICD-10-CM

## 2023-09-28 DIAGNOSIS — M5136 Other intervertebral disc degeneration, lumbar region: Principal | ICD-10-CM

## 2023-09-28 DIAGNOSIS — G43909 Migraine, unspecified, not intractable, without status migrainosus: Principal | ICD-10-CM

## 2023-09-28 DIAGNOSIS — G40909 Epilepsy, unspecified, not intractable, without status epilepticus: Principal | ICD-10-CM

## 2023-09-28 MED ORDER — CEFPODOXIME 200 MG TABLET
ORAL_TABLET | Freq: Every day | ORAL | 0 refills | 7 days | Status: CP
Start: 2023-09-28 — End: ?

## 2023-09-28 MED ORDER — METOPROLOL SUCCINATE ER 50 MG TABLET,EXTENDED RELEASE 24 HR
ORAL_TABLET | 3 refills | 0 days | Status: CP
Start: 2023-09-28 — End: ?

## 2023-09-28 MED ORDER — AZITHROMYCIN 250 MG TABLET
ORAL_TABLET | 0 refills | 0 days | Status: CP
Start: 2023-09-28 — End: ?

## 2023-09-28 MED ORDER — HYDROCODONE 10 MG-CHLORPHENIRAMINE 8 MG/5 ML ORAL SUSP EXTEND.REL 12HR
Freq: Two times a day (BID) | ORAL | 0 refills | 5 days | Status: CP | PRN
Start: 2023-09-28 — End: ?

## 2023-09-30 ENCOUNTER — Encounter: Admit: 2023-09-30 | Payer: MEDICARE

## 2023-09-30 ENCOUNTER — Encounter: Admit: 2023-09-30 | Discharge: 2023-10-29 | Payer: MEDICARE

## 2023-10-03 ENCOUNTER — Ambulatory Visit: Admit: 2023-10-03 | Discharge: 2023-10-04 | Payer: MEDICARE

## 2023-10-03 DIAGNOSIS — R059 Cough, unspecified type: Principal | ICD-10-CM

## 2023-10-03 MED ORDER — HYDROCODONE 10 MG-CHLORPHENIRAMINE 8 MG/5 ML ORAL SUSP EXTEND.REL 12HR
Freq: Two times a day (BID) | ORAL | 0 refills | 5 days | Status: CP | PRN
Start: 2023-10-03 — End: ?

## 2023-10-06 ENCOUNTER — Ambulatory Visit: Admit: 2023-10-06 | Discharge: 2023-10-07 | Payer: MEDICARE | Attending: Vascular Surgery | Primary: Vascular Surgery

## 2023-10-06 DIAGNOSIS — I739 Peripheral vascular disease, unspecified: Principal | ICD-10-CM

## 2023-10-06 DIAGNOSIS — Z95828 Presence of other vascular implants and grafts: Principal | ICD-10-CM

## 2023-10-10 ENCOUNTER — Ambulatory Visit: Admit: 2023-10-10 | Discharge: 2023-10-11 | Payer: MEDICARE | Attending: Vascular Surgery | Primary: Vascular Surgery

## 2023-10-10 DIAGNOSIS — I739 Peripheral vascular disease, unspecified: Principal | ICD-10-CM

## 2023-10-10 DIAGNOSIS — L97409 Non-pressure chronic ulcer of unspecified heel and midfoot with unspecified severity: Principal | ICD-10-CM

## 2023-10-13 ENCOUNTER — Ambulatory Visit: Admit: 2023-10-13 | Discharge: 2023-10-13 | Disposition: A | Discharge status: Home / Self Care | Payer: MEDICARE

## 2023-10-13 ENCOUNTER — Ambulatory Visit: Admit: 2023-10-13 | Discharge: 2023-10-14 | Disposition: A | Discharge status: Home / Self Care | Payer: MEDICARE

## 2023-10-13 DIAGNOSIS — I739 Peripheral vascular disease, unspecified: Principal | ICD-10-CM

## 2023-10-13 DIAGNOSIS — R197 Diarrhea, unspecified: Principal | ICD-10-CM

## 2023-10-13 DIAGNOSIS — K439 Ventral hernia without obstruction or gangrene: Principal | ICD-10-CM

## 2023-10-13 DIAGNOSIS — L97213 Non-pressure chronic ulcer of right calf with necrosis of muscle: Principal | ICD-10-CM

## 2023-10-13 DIAGNOSIS — Z5189 Encounter for other specified aftercare: Principal | ICD-10-CM

## 2023-10-13 DIAGNOSIS — I97648 Postprocedural seroma of a circulatory system organ or structure following other circulatory system procedure: Principal | ICD-10-CM

## 2023-10-13 DIAGNOSIS — T8130XA Disruption of wound, unspecified, initial encounter: Principal | ICD-10-CM

## 2023-10-17 DIAGNOSIS — F319 Bipolar disorder, unspecified: Principal | ICD-10-CM

## 2023-10-17 DIAGNOSIS — E1165 Type 2 diabetes mellitus with hyperglycemia: Principal | ICD-10-CM

## 2023-10-17 DIAGNOSIS — L089 Local infection of the skin and subcutaneous tissue, unspecified: Principal | ICD-10-CM

## 2023-10-17 DIAGNOSIS — E785 Hyperlipidemia, unspecified: Principal | ICD-10-CM

## 2023-10-17 DIAGNOSIS — I132 Hypertensive heart and chronic kidney disease with heart failure and with stage 5 chronic kidney disease, or end stage renal disease: Principal | ICD-10-CM

## 2023-10-17 DIAGNOSIS — E1122 Type 2 diabetes mellitus with diabetic chronic kidney disease: Principal | ICD-10-CM

## 2023-10-17 DIAGNOSIS — M5136 Other intervertebral disc degeneration, lumbar region: Principal | ICD-10-CM

## 2023-10-17 DIAGNOSIS — D631 Anemia in chronic kidney disease: Principal | ICD-10-CM

## 2023-10-17 DIAGNOSIS — E890 Postprocedural hypothyroidism: Principal | ICD-10-CM

## 2023-10-17 DIAGNOSIS — I252 Old myocardial infarction: Principal | ICD-10-CM

## 2023-10-17 DIAGNOSIS — E1151 Type 2 diabetes mellitus with diabetic peripheral angiopathy without gangrene: Principal | ICD-10-CM

## 2023-10-17 DIAGNOSIS — B961 Klebsiella pneumoniae [K. pneumoniae] as the cause of diseases classified elsewhere: Principal | ICD-10-CM

## 2023-10-17 DIAGNOSIS — G40909 Epilepsy, unspecified, not intractable, without status epilepticus: Principal | ICD-10-CM

## 2023-10-17 DIAGNOSIS — N186 End stage renal disease: Principal | ICD-10-CM

## 2023-10-17 DIAGNOSIS — G43909 Migraine, unspecified, not intractable, without status migrainosus: Principal | ICD-10-CM

## 2023-10-17 DIAGNOSIS — Z951 Presence of aortocoronary bypass graft: Principal | ICD-10-CM

## 2023-10-17 DIAGNOSIS — I5042 Chronic combined systolic (congestive) and diastolic (congestive) heart failure: Principal | ICD-10-CM

## 2023-10-17 DIAGNOSIS — Z9884 Bariatric surgery status: Principal | ICD-10-CM

## 2023-10-17 DIAGNOSIS — G894 Chronic pain syndrome: Principal | ICD-10-CM

## 2023-10-17 DIAGNOSIS — M1990 Unspecified osteoarthritis, unspecified site: Principal | ICD-10-CM

## 2023-10-17 DIAGNOSIS — E11621 Type 2 diabetes mellitus with foot ulcer: Principal | ICD-10-CM

## 2023-10-17 DIAGNOSIS — L97412 Non-pressure chronic ulcer of right heel and midfoot with fat layer exposed: Principal | ICD-10-CM

## 2023-10-17 DIAGNOSIS — Z48812 Encounter for surgical aftercare following surgery on the circulatory system: Principal | ICD-10-CM

## 2023-10-17 DIAGNOSIS — B182 Chronic viral hepatitis C: Principal | ICD-10-CM

## 2023-10-17 DIAGNOSIS — I2510 Atherosclerotic heart disease of native coronary artery without angina pectoris: Principal | ICD-10-CM

## 2023-10-19 ENCOUNTER — Ambulatory Visit: Admit: 2023-10-19 | Discharge: 2023-10-20 | Payer: MEDICARE

## 2023-10-19 DIAGNOSIS — L97413 Non-pressure chronic ulcer of right heel and midfoot with necrosis of muscle: Principal | ICD-10-CM

## 2023-10-20 DIAGNOSIS — E890 Postprocedural hypothyroidism: Principal | ICD-10-CM

## 2023-10-20 DIAGNOSIS — F319 Bipolar disorder, unspecified: Principal | ICD-10-CM

## 2023-10-20 DIAGNOSIS — G894 Chronic pain syndrome: Principal | ICD-10-CM

## 2023-10-20 DIAGNOSIS — E1151 Type 2 diabetes mellitus with diabetic peripheral angiopathy without gangrene: Principal | ICD-10-CM

## 2023-10-20 DIAGNOSIS — E1122 Type 2 diabetes mellitus with diabetic chronic kidney disease: Principal | ICD-10-CM

## 2023-10-20 DIAGNOSIS — L97412 Non-pressure chronic ulcer of right heel and midfoot with fat layer exposed: Principal | ICD-10-CM

## 2023-10-20 DIAGNOSIS — G43909 Migraine, unspecified, not intractable, without status migrainosus: Principal | ICD-10-CM

## 2023-10-20 DIAGNOSIS — B961 Klebsiella pneumoniae [K. pneumoniae] as the cause of diseases classified elsewhere: Principal | ICD-10-CM

## 2023-10-20 DIAGNOSIS — N186 End stage renal disease: Principal | ICD-10-CM

## 2023-10-20 DIAGNOSIS — Z9884 Bariatric surgery status: Principal | ICD-10-CM

## 2023-10-20 DIAGNOSIS — D631 Anemia in chronic kidney disease: Principal | ICD-10-CM

## 2023-10-20 DIAGNOSIS — E785 Hyperlipidemia, unspecified: Principal | ICD-10-CM

## 2023-10-20 DIAGNOSIS — G40909 Epilepsy, unspecified, not intractable, without status epilepticus: Principal | ICD-10-CM

## 2023-10-20 DIAGNOSIS — I5042 Chronic combined systolic (congestive) and diastolic (congestive) heart failure: Principal | ICD-10-CM

## 2023-10-20 DIAGNOSIS — Z48812 Encounter for surgical aftercare following surgery on the circulatory system: Principal | ICD-10-CM

## 2023-10-20 DIAGNOSIS — E1165 Type 2 diabetes mellitus with hyperglycemia: Principal | ICD-10-CM

## 2023-10-20 DIAGNOSIS — B182 Chronic viral hepatitis C: Principal | ICD-10-CM

## 2023-10-20 DIAGNOSIS — E11621 Type 2 diabetes mellitus with foot ulcer: Principal | ICD-10-CM

## 2023-10-20 DIAGNOSIS — M5136 Other intervertebral disc degeneration, lumbar region: Principal | ICD-10-CM

## 2023-10-20 DIAGNOSIS — I252 Old myocardial infarction: Principal | ICD-10-CM

## 2023-10-20 DIAGNOSIS — L089 Local infection of the skin and subcutaneous tissue, unspecified: Principal | ICD-10-CM

## 2023-10-20 DIAGNOSIS — Z951 Presence of aortocoronary bypass graft: Principal | ICD-10-CM

## 2023-10-20 DIAGNOSIS — I132 Hypertensive heart and chronic kidney disease with heart failure and with stage 5 chronic kidney disease, or end stage renal disease: Principal | ICD-10-CM

## 2023-10-20 DIAGNOSIS — I2510 Atherosclerotic heart disease of native coronary artery without angina pectoris: Principal | ICD-10-CM

## 2023-10-20 DIAGNOSIS — M1990 Unspecified osteoarthritis, unspecified site: Principal | ICD-10-CM

## 2023-10-23 DIAGNOSIS — T82858A Stenosis of vascular prosthetic devices, implants and grafts, initial encounter: Principal | ICD-10-CM

## 2023-10-23 MED ORDER — QUETIAPINE 200 MG TABLET
ORAL_TABLET | Freq: Every evening | ORAL | 0 refills | 90 days | Status: CP
Start: 2023-10-23 — End: 2024-01-21

## 2023-10-24 DIAGNOSIS — B182 Chronic viral hepatitis C: Principal | ICD-10-CM

## 2023-10-24 DIAGNOSIS — M1990 Unspecified osteoarthritis, unspecified site: Principal | ICD-10-CM

## 2023-10-24 DIAGNOSIS — G40909 Epilepsy, unspecified, not intractable, without status epilepticus: Principal | ICD-10-CM

## 2023-10-24 DIAGNOSIS — L089 Local infection of the skin and subcutaneous tissue, unspecified: Principal | ICD-10-CM

## 2023-10-24 DIAGNOSIS — Z48812 Encounter for surgical aftercare following surgery on the circulatory system: Principal | ICD-10-CM

## 2023-10-24 DIAGNOSIS — E785 Hyperlipidemia, unspecified: Principal | ICD-10-CM

## 2023-10-24 DIAGNOSIS — G894 Chronic pain syndrome: Principal | ICD-10-CM

## 2023-10-24 DIAGNOSIS — F319 Bipolar disorder, unspecified: Principal | ICD-10-CM

## 2023-10-24 DIAGNOSIS — E1165 Type 2 diabetes mellitus with hyperglycemia: Principal | ICD-10-CM

## 2023-10-24 DIAGNOSIS — I5042 Chronic combined systolic (congestive) and diastolic (congestive) heart failure: Principal | ICD-10-CM

## 2023-10-24 DIAGNOSIS — L97412 Non-pressure chronic ulcer of right heel and midfoot with fat layer exposed: Principal | ICD-10-CM

## 2023-10-24 DIAGNOSIS — I132 Hypertensive heart and chronic kidney disease with heart failure and with stage 5 chronic kidney disease, or end stage renal disease: Principal | ICD-10-CM

## 2023-10-24 DIAGNOSIS — N186 End stage renal disease: Principal | ICD-10-CM

## 2023-10-24 DIAGNOSIS — G43909 Migraine, unspecified, not intractable, without status migrainosus: Principal | ICD-10-CM

## 2023-10-24 DIAGNOSIS — I2510 Atherosclerotic heart disease of native coronary artery without angina pectoris: Principal | ICD-10-CM

## 2023-10-24 DIAGNOSIS — E1151 Type 2 diabetes mellitus with diabetic peripheral angiopathy without gangrene: Principal | ICD-10-CM

## 2023-10-24 DIAGNOSIS — I252 Old myocardial infarction: Principal | ICD-10-CM

## 2023-10-24 DIAGNOSIS — Z9884 Bariatric surgery status: Principal | ICD-10-CM

## 2023-10-24 DIAGNOSIS — M5136 Other intervertebral disc degeneration, lumbar region: Principal | ICD-10-CM

## 2023-10-24 DIAGNOSIS — Z951 Presence of aortocoronary bypass graft: Principal | ICD-10-CM

## 2023-10-24 DIAGNOSIS — D631 Anemia in chronic kidney disease: Principal | ICD-10-CM

## 2023-10-24 DIAGNOSIS — E11621 Type 2 diabetes mellitus with foot ulcer: Principal | ICD-10-CM

## 2023-10-24 DIAGNOSIS — E890 Postprocedural hypothyroidism: Principal | ICD-10-CM

## 2023-10-24 DIAGNOSIS — E1122 Type 2 diabetes mellitus with diabetic chronic kidney disease: Principal | ICD-10-CM

## 2023-10-24 DIAGNOSIS — B961 Klebsiella pneumoniae [K. pneumoniae] as the cause of diseases classified elsewhere: Principal | ICD-10-CM

## 2023-10-26 ENCOUNTER — Ambulatory Visit: Admit: 2023-10-26 | Discharge: 2023-10-27 | Payer: MEDICARE

## 2023-10-27 MED ORDER — FLUTICASONE PROPIONATE 50 MCG/ACTUATION NASAL SPRAY,SUSPENSION
0 refills | 0 days
Start: 2023-10-27 — End: ?

## 2023-10-28 ENCOUNTER — Ambulatory Visit: Admit: 2023-10-28 | Discharge: 2023-10-28 | Disposition: A | Discharge status: Home / Self Care | Payer: MEDICARE

## 2023-10-28 DIAGNOSIS — M79604 Pain in right leg: Principal | ICD-10-CM

## 2023-10-28 DIAGNOSIS — S81801D Unspecified open wound, right lower leg, subsequent encounter: Principal | ICD-10-CM

## 2023-10-28 MED ORDER — DOXYCYCLINE HYCLATE 100 MG CAPSULE
ORAL_CAPSULE | Freq: Two times a day (BID) | ORAL | 0 refills | 10 days | Status: CP
Start: 2023-10-28 — End: 2023-11-07

## 2023-10-30 ENCOUNTER — Encounter: Admit: 2023-10-30 | Payer: MEDICARE

## 2023-11-02 ENCOUNTER — Ambulatory Visit: Admit: 2023-11-02 | Discharge: 2023-11-02 | Payer: MEDICARE

## 2023-11-02 DIAGNOSIS — L97413 Non-pressure chronic ulcer of right heel and midfoot with necrosis of muscle: Principal | ICD-10-CM

## 2023-11-02 DIAGNOSIS — E11621 Type 2 diabetes mellitus with foot ulcer: Principal | ICD-10-CM

## 2023-11-02 DIAGNOSIS — I739 Peripheral vascular disease, unspecified: Principal | ICD-10-CM

## 2023-11-02 DIAGNOSIS — L97411 Non-pressure chronic ulcer of right heel and midfoot limited to breakdown of skin: Principal | ICD-10-CM

## 2023-11-02 DIAGNOSIS — T8130XA Disruption of wound, unspecified, initial encounter: Principal | ICD-10-CM

## 2023-11-02 DIAGNOSIS — L97213 Non-pressure chronic ulcer of right calf with necrosis of muscle: Principal | ICD-10-CM

## 2023-11-08 DIAGNOSIS — K429 Umbilical hernia without obstruction or gangrene: Principal | ICD-10-CM

## 2023-11-15 ENCOUNTER — Ambulatory Visit: Admit: 2023-11-15 | Discharge: 2023-11-16 | Payer: MEDICARE

## 2023-11-16 DIAGNOSIS — E1122 Type 2 diabetes mellitus with diabetic chronic kidney disease: Principal | ICD-10-CM

## 2023-11-16 DIAGNOSIS — I2510 Atherosclerotic heart disease of native coronary artery without angina pectoris: Principal | ICD-10-CM

## 2023-11-16 DIAGNOSIS — Z992 Dependence on renal dialysis: Principal | ICD-10-CM

## 2023-11-16 DIAGNOSIS — E785 Hyperlipidemia, unspecified: Principal | ICD-10-CM

## 2023-11-16 DIAGNOSIS — E890 Postprocedural hypothyroidism: Principal | ICD-10-CM

## 2023-11-16 DIAGNOSIS — N186 End stage renal disease: Principal | ICD-10-CM

## 2023-11-16 DIAGNOSIS — Z951 Presence of aortocoronary bypass graft: Principal | ICD-10-CM

## 2023-11-16 DIAGNOSIS — E1151 Type 2 diabetes mellitus with diabetic peripheral angiopathy without gangrene: Principal | ICD-10-CM

## 2023-11-16 DIAGNOSIS — M1990 Unspecified osteoarthritis, unspecified site: Principal | ICD-10-CM

## 2023-11-16 DIAGNOSIS — T8131XA Disruption of external operation (surgical) wound, not elsewhere classified, initial encounter: Principal | ICD-10-CM

## 2023-11-16 DIAGNOSIS — I5042 Chronic combined systolic (congestive) and diastolic (congestive) heart failure: Principal | ICD-10-CM

## 2023-11-16 DIAGNOSIS — Z9884 Bariatric surgery status: Principal | ICD-10-CM

## 2023-11-16 DIAGNOSIS — G43909 Migraine, unspecified, not intractable, without status migrainosus: Principal | ICD-10-CM

## 2023-11-16 DIAGNOSIS — Z9181 History of falling: Principal | ICD-10-CM

## 2023-11-16 DIAGNOSIS — M51369 Other intervertebral disc degeneration, lumbar region without mention of lumbar back pain or lower extremity pain: Principal | ICD-10-CM

## 2023-11-16 DIAGNOSIS — E1165 Type 2 diabetes mellitus with hyperglycemia: Principal | ICD-10-CM

## 2023-11-16 DIAGNOSIS — Z955 Presence of coronary angioplasty implant and graft: Principal | ICD-10-CM

## 2023-11-16 DIAGNOSIS — D631 Anemia in chronic kidney disease: Principal | ICD-10-CM

## 2023-11-16 DIAGNOSIS — F319 Bipolar disorder, unspecified: Principal | ICD-10-CM

## 2023-11-16 DIAGNOSIS — I252 Old myocardial infarction: Principal | ICD-10-CM

## 2023-11-16 DIAGNOSIS — G40909 Epilepsy, unspecified, not intractable, without status epilepticus: Principal | ICD-10-CM

## 2023-11-16 DIAGNOSIS — Z86711 Personal history of pulmonary embolism: Principal | ICD-10-CM

## 2023-11-16 DIAGNOSIS — G894 Chronic pain syndrome: Principal | ICD-10-CM

## 2023-11-16 DIAGNOSIS — B182 Chronic viral hepatitis C: Principal | ICD-10-CM

## 2023-11-16 DIAGNOSIS — I132 Hypertensive heart and chronic kidney disease with heart failure and with stage 5 chronic kidney disease, or end stage renal disease: Principal | ICD-10-CM

## 2023-11-17 DIAGNOSIS — I5042 Chronic combined systolic (congestive) and diastolic (congestive) heart failure: Principal | ICD-10-CM

## 2023-11-17 DIAGNOSIS — D631 Anemia in chronic kidney disease: Principal | ICD-10-CM

## 2023-11-17 DIAGNOSIS — G40909 Epilepsy, unspecified, not intractable, without status epilepticus: Principal | ICD-10-CM

## 2023-11-17 DIAGNOSIS — B182 Chronic viral hepatitis C: Principal | ICD-10-CM

## 2023-11-17 DIAGNOSIS — T8131XA Disruption of external operation (surgical) wound, not elsewhere classified, initial encounter: Principal | ICD-10-CM

## 2023-11-17 DIAGNOSIS — Z992 Dependence on renal dialysis: Principal | ICD-10-CM

## 2023-11-17 DIAGNOSIS — Z9181 History of falling: Principal | ICD-10-CM

## 2023-11-17 DIAGNOSIS — F319 Bipolar disorder, unspecified: Principal | ICD-10-CM

## 2023-11-17 DIAGNOSIS — E1151 Type 2 diabetes mellitus with diabetic peripheral angiopathy without gangrene: Principal | ICD-10-CM

## 2023-11-17 DIAGNOSIS — I252 Old myocardial infarction: Principal | ICD-10-CM

## 2023-11-17 DIAGNOSIS — Z9884 Bariatric surgery status: Principal | ICD-10-CM

## 2023-11-17 DIAGNOSIS — E1165 Type 2 diabetes mellitus with hyperglycemia: Principal | ICD-10-CM

## 2023-11-17 DIAGNOSIS — Z86711 Personal history of pulmonary embolism: Principal | ICD-10-CM

## 2023-11-17 DIAGNOSIS — Z951 Presence of aortocoronary bypass graft: Principal | ICD-10-CM

## 2023-11-17 DIAGNOSIS — G894 Chronic pain syndrome: Principal | ICD-10-CM

## 2023-11-17 DIAGNOSIS — I2510 Atherosclerotic heart disease of native coronary artery without angina pectoris: Principal | ICD-10-CM

## 2023-11-17 DIAGNOSIS — M51369 Other intervertebral disc degeneration, lumbar region without mention of lumbar back pain or lower extremity pain: Principal | ICD-10-CM

## 2023-11-17 DIAGNOSIS — E890 Postprocedural hypothyroidism: Principal | ICD-10-CM

## 2023-11-17 DIAGNOSIS — E1122 Type 2 diabetes mellitus with diabetic chronic kidney disease: Principal | ICD-10-CM

## 2023-11-17 DIAGNOSIS — E785 Hyperlipidemia, unspecified: Principal | ICD-10-CM

## 2023-11-17 DIAGNOSIS — M1990 Unspecified osteoarthritis, unspecified site: Principal | ICD-10-CM

## 2023-11-17 DIAGNOSIS — I132 Hypertensive heart and chronic kidney disease with heart failure and with stage 5 chronic kidney disease, or end stage renal disease: Principal | ICD-10-CM

## 2023-11-17 DIAGNOSIS — G43909 Migraine, unspecified, not intractable, without status migrainosus: Principal | ICD-10-CM

## 2023-11-17 DIAGNOSIS — N186 End stage renal disease: Principal | ICD-10-CM

## 2023-11-17 DIAGNOSIS — Z955 Presence of coronary angioplasty implant and graft: Principal | ICD-10-CM

## 2023-11-20 ENCOUNTER — Ambulatory Visit: Admit: 2023-11-20 | Discharge: 2023-11-25 | Payer: MEDICARE

## 2023-11-20 ENCOUNTER — Ambulatory Visit: Admit: 2023-11-20 | Discharge: 2023-11-25 | Disposition: A | Discharge status: Home / Self Care | Payer: MEDICARE

## 2023-11-21 DIAGNOSIS — L988 Other specified disorders of the skin and subcutaneous tissue: Principal | ICD-10-CM

## 2023-11-22 DIAGNOSIS — I2510 Atherosclerotic heart disease of native coronary artery without angina pectoris: Principal | ICD-10-CM

## 2023-11-22 DIAGNOSIS — G40909 Epilepsy, unspecified, not intractable, without status epilepticus: Principal | ICD-10-CM

## 2023-11-22 DIAGNOSIS — E1151 Type 2 diabetes mellitus with diabetic peripheral angiopathy without gangrene: Principal | ICD-10-CM

## 2023-11-22 DIAGNOSIS — T8131XA Disruption of external operation (surgical) wound, not elsewhere classified, initial encounter: Principal | ICD-10-CM

## 2023-11-22 DIAGNOSIS — Z9181 History of falling: Principal | ICD-10-CM

## 2023-11-22 DIAGNOSIS — N186 End stage renal disease: Principal | ICD-10-CM

## 2023-11-22 DIAGNOSIS — E785 Hyperlipidemia, unspecified: Principal | ICD-10-CM

## 2023-11-22 DIAGNOSIS — Z9884 Bariatric surgery status: Principal | ICD-10-CM

## 2023-11-22 DIAGNOSIS — F319 Bipolar disorder, unspecified: Principal | ICD-10-CM

## 2023-11-22 DIAGNOSIS — E1165 Type 2 diabetes mellitus with hyperglycemia: Principal | ICD-10-CM

## 2023-11-22 DIAGNOSIS — G43909 Migraine, unspecified, not intractable, without status migrainosus: Principal | ICD-10-CM

## 2023-11-22 DIAGNOSIS — G894 Chronic pain syndrome: Principal | ICD-10-CM

## 2023-11-22 DIAGNOSIS — M1990 Unspecified osteoarthritis, unspecified site: Principal | ICD-10-CM

## 2023-11-22 DIAGNOSIS — I132 Hypertensive heart and chronic kidney disease with heart failure and with stage 5 chronic kidney disease, or end stage renal disease: Principal | ICD-10-CM

## 2023-11-22 DIAGNOSIS — I5042 Chronic combined systolic (congestive) and diastolic (congestive) heart failure: Principal | ICD-10-CM

## 2023-11-22 DIAGNOSIS — Z955 Presence of coronary angioplasty implant and graft: Principal | ICD-10-CM

## 2023-11-22 DIAGNOSIS — Z86711 Personal history of pulmonary embolism: Principal | ICD-10-CM

## 2023-11-22 DIAGNOSIS — I252 Old myocardial infarction: Principal | ICD-10-CM

## 2023-11-22 DIAGNOSIS — M51369 Other intervertebral disc degeneration, lumbar region without mention of lumbar back pain or lower extremity pain: Principal | ICD-10-CM

## 2023-11-22 DIAGNOSIS — E890 Postprocedural hypothyroidism: Principal | ICD-10-CM

## 2023-11-22 DIAGNOSIS — B182 Chronic viral hepatitis C: Principal | ICD-10-CM

## 2023-11-22 DIAGNOSIS — Z951 Presence of aortocoronary bypass graft: Principal | ICD-10-CM

## 2023-11-22 DIAGNOSIS — D631 Anemia in chronic kidney disease: Principal | ICD-10-CM

## 2023-11-22 DIAGNOSIS — E1122 Type 2 diabetes mellitus with diabetic chronic kidney disease: Principal | ICD-10-CM

## 2023-11-22 DIAGNOSIS — Z992 Dependence on renal dialysis: Principal | ICD-10-CM

## 2023-11-23 ENCOUNTER — Ambulatory Visit: Admit: 2023-11-23 | Payer: MEDICARE

## 2023-11-24 MED ORDER — LEVOTHYROXINE 75 MCG TABLET
ORAL_TABLET | Freq: Every day | ORAL | 0 refills | 30 days
Start: 2023-11-24 — End: 2023-12-24

## 2023-11-24 MED ORDER — QUETIAPINE 200 MG TABLET
ORAL_TABLET | Freq: Every evening | ORAL | 0 refills | 30 days | Status: CP
Start: 2023-11-24 — End: 2023-12-24

## 2023-11-25 MED ORDER — METRONIDAZOLE 250 MG TABLET
ORAL_TABLET | Freq: Three times a day (TID) | ORAL | 1 refills | 30 days | Status: CP
Start: 2023-11-25 — End: 2024-01-24

## 2023-11-25 MED ORDER — OXYCODONE 5 MG TABLET
ORAL_TABLET | ORAL | 0 refills | 2 days | Status: CP | PRN
Start: 2023-11-25 — End: 2023-11-30

## 2023-11-25 MED ORDER — LEVOTHYROXINE 75 MCG TABLET
ORAL_TABLET | Freq: Every day | ORAL | 0 refills | 30 days | Status: CP
Start: 2023-11-25 — End: 2023-12-25

## 2023-11-26 DIAGNOSIS — R110 Nausea: Principal | ICD-10-CM

## 2023-11-26 MED ORDER — ONDANSETRON HCL 4 MG TABLET
ORAL_TABLET | 0 refills | 0 days
Start: 2023-11-26 — End: ?

## 2023-11-27 MED ORDER — ONDANSETRON HCL 4 MG TABLET
ORAL_TABLET | Freq: Every day | ORAL | 0 refills | 20.00 days | Status: CP | PRN
Start: 2023-11-27 — End: 2024-11-26

## 2023-11-29 ENCOUNTER — Encounter: Admit: 2023-11-29 | Payer: MEDICARE

## 2023-12-01 ENCOUNTER — Telehealth
Admit: 2023-12-01 | Discharge: 2023-12-02 | Payer: MEDICARE | Attending: Psychiatric/Mental Health | Primary: Psychiatric/Mental Health

## 2023-12-01 DIAGNOSIS — F4323 Adjustment disorder with mixed anxiety and depressed mood: Principal | ICD-10-CM

## 2023-12-01 MED ORDER — LAMOTRIGINE 150 MG TABLET
ORAL_TABLET | Freq: Every day | ORAL | 0 refills | 90.00 days | Status: CP
Start: 2023-12-01 — End: 2024-02-29

## 2023-12-01 MED ORDER — SERTRALINE 100 MG TABLET
ORAL_TABLET | Freq: Every day | ORAL | 0 refills | 90.00 days | Status: CP
Start: 2023-12-01 — End: 2024-02-29

## 2023-12-01 MED ORDER — QUETIAPINE 300 MG TABLET
ORAL_TABLET | Freq: Every evening | ORAL | 0 refills | 90.00 days | Status: CP
Start: 2023-12-01 — End: 2024-02-29

## 2023-12-05 ENCOUNTER — Institutional Professional Consult (permissible substitution): Admit: 2023-12-05 | Discharge: 2023-12-06 | Payer: MEDICARE

## 2023-12-05 DIAGNOSIS — S91301S Unspecified open wound, right foot, sequela: Principal | ICD-10-CM

## 2023-12-05 DIAGNOSIS — Z09 Encounter for follow-up examination after completed treatment for conditions other than malignant neoplasm: Principal | ICD-10-CM

## 2023-12-05 MED ORDER — OXYCODONE 5 MG TABLET
ORAL_TABLET | 0 refills | 0.00 days | Status: CP
Start: 2023-12-05 — End: ?

## 2023-12-06 ENCOUNTER — Ambulatory Visit: Admit: 2023-12-06 | Discharge: 2023-12-06 | Payer: MEDICARE

## 2023-12-06 DIAGNOSIS — T368X5D Adverse effect of other systemic antibiotics, subsequent encounter: Principal | ICD-10-CM

## 2023-12-06 DIAGNOSIS — N760 Acute vaginitis: Principal | ICD-10-CM

## 2023-12-06 MED ORDER — FLUCONAZOLE 150 MG TABLET
ORAL_TABLET | 1 refills | 0.00 days | Status: CP
Start: 2023-12-06 — End: ?

## 2023-12-07 ENCOUNTER — Ambulatory Visit: Admit: 2023-12-07 | Payer: MEDICARE | Attending: Psychiatric/Mental Health | Primary: Psychiatric/Mental Health

## 2023-12-08 ENCOUNTER — Ambulatory Visit: Admit: 2023-12-08 | Payer: MEDICARE | Attending: Psychiatric/Mental Health | Primary: Psychiatric/Mental Health

## 2023-12-11 MED ORDER — TIZANIDINE 4 MG TABLET
ORAL_TABLET | Freq: Two times a day (BID) | ORAL | 1 refills | 90.00 days | PRN
Start: 2023-12-11 — End: 2024-12-10

## 2023-12-12 MED ORDER — TIZANIDINE 4 MG TABLET
ORAL_TABLET | Freq: Two times a day (BID) | ORAL | 0 refills | 90.00 days | Status: CP | PRN
Start: 2023-12-12 — End: 2024-12-11

## 2023-12-17 MED ORDER — SERTRALINE 50 MG TABLET
ORAL_TABLET | Freq: Every day | ORAL | 0 refills | 30.00 days
Start: 2023-12-17 — End: 2024-12-16

## 2023-12-17 MED ORDER — TICAGRELOR 90 MG TABLET
ORAL_TABLET | Freq: Two times a day (BID) | ORAL | 0 refills | 30.00 days
Start: 2023-12-17 — End: 2024-12-16

## 2023-12-17 MED ORDER — BUMETANIDE 2 MG TABLET
ORAL_TABLET | Freq: Every day | ORAL | 0 refills | 30.00 days
Start: 2023-12-17 — End: 2024-12-16

## 2023-12-17 MED ORDER — LAMOTRIGINE 150 MG TABLET
ORAL_TABLET | Freq: Every day | ORAL | 0 refills | 30.00 days
Start: 2023-12-17 — End: 2024-12-16

## 2023-12-18 MED ORDER — BUMETANIDE 2 MG TABLET
ORAL_TABLET | Freq: Every day | ORAL | 1 refills | 90.00 days | Status: CP
Start: 2023-12-18 — End: 2024-12-17

## 2023-12-18 MED ORDER — SERTRALINE 50 MG TABLET
ORAL_TABLET | Freq: Every day | ORAL | 0 refills | 30.00 days
Start: 2023-12-18 — End: 2024-12-17

## 2023-12-18 MED ORDER — TICAGRELOR 90 MG TABLET
ORAL_TABLET | Freq: Two times a day (BID) | ORAL | 0 refills | 30.00 days | Status: CP
Start: 2023-12-18 — End: 2024-12-17

## 2023-12-18 MED ORDER — LAMOTRIGINE 150 MG TABLET
ORAL_TABLET | Freq: Every day | ORAL | 0 refills | 30.00 days
Start: 2023-12-18 — End: 2024-12-17

## 2023-12-21 ENCOUNTER — Ambulatory Visit
Admit: 2023-12-21 | Discharge: 2024-01-08 | Disposition: A | Discharge status: Skilled Nursing Facility | Payer: MEDICARE | Admitting: Vascular Surgery

## 2023-12-21 ENCOUNTER — Ambulatory Visit: Admit: 2023-12-21 | Payer: MEDICARE

## 2023-12-21 ENCOUNTER — Inpatient Hospital Stay
Admit: 2023-12-21 | Discharge: 2024-01-08 | Disposition: A | Discharge status: Skilled Nursing Facility | Payer: MEDICARE | Admitting: Vascular Surgery

## 2023-12-21 ENCOUNTER — Encounter: Admit: 2023-12-21 | Payer: MEDICARE

## 2023-12-21 ENCOUNTER — Ambulatory Visit: Admit: 2023-12-21 | Discharge: 2024-01-08 | Payer: MEDICARE

## 2024-01-07 MED ORDER — BLOOD GLUCOSE TEST STRIPS
ORAL_STRIP | 11 refills | 0.00 days
Start: 2024-01-07 — End: 2025-01-06

## 2024-01-07 MED ORDER — LANCETS
11 refills | 0.00 days
Start: 2024-01-07 — End: 2025-01-06

## 2024-01-08 MED ORDER — BLOOD GLUCOSE TEST STRIPS
ORAL_STRIP | 11 refills | 0.00 days | Status: CP
Start: 2024-01-08 — End: 2025-01-06

## 2024-01-08 MED ORDER — LANCETS
11 refills | 0.00 days | Status: CP
Start: 2024-01-08 — End: 2025-01-06

## 2024-01-10 ENCOUNTER — Emergency Department
Admit: 2024-01-10 | Discharge: 2024-01-11 | Disposition: A | Discharge status: Skilled Nursing Facility | Payer: MEDICARE | Attending: Family

## 2024-01-16 ENCOUNTER — Ambulatory Visit: Admit: 2024-01-16 | Discharge: 2024-01-17 | Payer: MEDICARE | Attending: Vascular Surgery | Primary: Vascular Surgery

## 2024-01-16 DIAGNOSIS — Z89519 Acquired absence of unspecified leg below knee: Principal | ICD-10-CM

## 2024-01-16 DIAGNOSIS — I739 Peripheral vascular disease, unspecified: Principal | ICD-10-CM

## 2024-02-19 DIAGNOSIS — K219 Gastro-esophageal reflux disease without esophagitis: Principal | ICD-10-CM

## 2024-02-19 MED ORDER — PANTOPRAZOLE 20 MG TABLET,DELAYED RELEASE
ORAL_TABLET | Freq: Every day | ORAL | 1 refills | 90.00 days
Start: 2024-02-19 — End: ?

## 2024-02-20 ENCOUNTER — Ambulatory Visit: Admit: 2024-02-20 | Discharge: 2024-02-21 | Payer: MEDICARE

## 2024-02-20 DIAGNOSIS — G2581 Restless legs syndrome: Principal | ICD-10-CM

## 2024-02-20 DIAGNOSIS — Z89511 Acquired absence of right leg below knee: Principal | ICD-10-CM

## 2024-02-20 MED ORDER — GABAPENTIN 300 MG CAPSULE
ORAL_CAPSULE | 1 refills | 0.00 days | Status: CP
Start: 2024-02-20 — End: ?

## 2024-02-22 DIAGNOSIS — Z89519 Acquired absence of unspecified leg below knee: Principal | ICD-10-CM

## 2024-02-22 MED ORDER — OXYCODONE 5 MG TABLET
ORAL_TABLET | Freq: Three times a day (TID) | ORAL | 0 refills | 4.00 days | Status: CP | PRN
Start: 2024-02-22 — End: ?

## 2024-02-23 DIAGNOSIS — R399 Unspecified symptoms and signs involving the genitourinary system: Principal | ICD-10-CM

## 2024-02-23 MED ORDER — TIZANIDINE 4 MG TABLET
ORAL_TABLET | Freq: Two times a day (BID) | ORAL | 0 refills | 90.00 days | Status: CP | PRN
Start: 2024-02-23 — End: 2025-02-21

## 2024-03-01 DIAGNOSIS — R110 Nausea: Principal | ICD-10-CM

## 2024-03-01 DIAGNOSIS — Z89511 Acquired absence of right leg below knee: Principal | ICD-10-CM

## 2024-03-01 MED ORDER — LEVOTHYROXINE 75 MCG TABLET
ORAL_TABLET | Freq: Every day | ORAL | 2 refills | 90.00 days
Start: 2024-03-01 — End: 2025-03-01

## 2024-03-01 MED ORDER — LAMOTRIGINE 150 MG TABLET
ORAL_TABLET | Freq: Every day | ORAL | 0 refills | 30.00 days | Status: CP
Start: 2024-03-01 — End: 2024-03-31

## 2024-03-01 MED ORDER — CHLORTHALIDONE 25 MG TABLET
ORAL_TABLET | Freq: Every morning | ORAL | 2 refills | 90.00 days
Start: 2024-03-01 — End: 2025-03-01

## 2024-03-01 MED ORDER — SERTRALINE 100 MG TABLET
ORAL_TABLET | Freq: Every day | ORAL | 0 refills | 30.00 days | Status: CP
Start: 2024-03-01 — End: 2024-03-31

## 2024-03-01 MED ORDER — QUETIAPINE 300 MG TABLET
ORAL_TABLET | Freq: Every evening | ORAL | 0 refills | 90.00 days | Status: CP
Start: 2024-03-01 — End: 2024-03-31

## 2024-03-01 MED ORDER — ONDANSETRON HCL 4 MG TABLET
ORAL_TABLET | Freq: Every day | ORAL | 0 refills | 20.00 days | PRN
Start: 2024-03-01 — End: 2025-03-01

## 2024-03-04 MED ORDER — LEVOTHYROXINE 75 MCG TABLET
ORAL_TABLET | Freq: Every day | ORAL | 3 refills | 100.00 days | Status: CP
Start: 2024-03-04 — End: 2025-04-08

## 2024-03-05 ENCOUNTER — Encounter
Admit: 2024-03-05 | Discharge: 2024-03-06 | Payer: MEDICARE | Attending: Psychiatric/Mental Health | Primary: Psychiatric/Mental Health

## 2024-03-05 DIAGNOSIS — F331 Major depressive disorder, recurrent, moderate: Principal | ICD-10-CM

## 2024-03-07 DIAGNOSIS — K219 Gastro-esophageal reflux disease without esophagitis: Principal | ICD-10-CM

## 2024-03-07 DIAGNOSIS — I10 Essential (primary) hypertension: Principal | ICD-10-CM

## 2024-03-07 DIAGNOSIS — L97509 Non-pressure chronic ulcer of other part of unspecified foot with unspecified severity: Principal | ICD-10-CM

## 2024-03-07 DIAGNOSIS — Z794 Long term (current) use of insulin: Principal | ICD-10-CM

## 2024-03-07 DIAGNOSIS — E11621 Type 2 diabetes mellitus with foot ulcer: Principal | ICD-10-CM

## 2024-03-07 MED ORDER — LANTUS U-100 INSULIN 100 UNIT/ML SUBCUTANEOUS SOLUTION
Freq: Every evening | SUBCUTANEOUS | 6 refills | 50 days | Status: CP
Start: 2024-03-07 — End: ?

## 2024-03-07 MED ORDER — VALSARTAN 160 MG TABLET
ORAL_TABLET | Freq: Every day | ORAL | 3 refills | 100.00 days | Status: CP
Start: 2024-03-07 — End: ?

## 2024-03-07 MED ORDER — METOCLOPRAMIDE 10 MG TABLET
ORAL_TABLET | Freq: Every day | ORAL | 1 refills | 90.00 days | Status: CP | PRN
Start: 2024-03-07 — End: 2024-04-06

## 2024-03-08 DIAGNOSIS — R110 Nausea: Principal | ICD-10-CM

## 2024-03-08 DIAGNOSIS — E1122 Type 2 diabetes mellitus with diabetic chronic kidney disease: Principal | ICD-10-CM

## 2024-03-08 DIAGNOSIS — Z992 Dependence on renal dialysis: Principal | ICD-10-CM

## 2024-03-08 DIAGNOSIS — E11621 Type 2 diabetes mellitus with foot ulcer: Principal | ICD-10-CM

## 2024-03-08 DIAGNOSIS — Z794 Long term (current) use of insulin: Principal | ICD-10-CM

## 2024-03-08 DIAGNOSIS — L97509 Non-pressure chronic ulcer of other part of unspecified foot with unspecified severity: Principal | ICD-10-CM

## 2024-03-08 DIAGNOSIS — N186 End stage renal disease: Principal | ICD-10-CM

## 2024-03-08 MED ORDER — PEN NEEDLE, DIABETIC 32 GAUGE X 5/32" (4 MM)
Freq: Three times a day (TID) | 3 refills | 67.00 days | Status: CP
Start: 2024-03-08 — End: ?

## 2024-03-08 MED ORDER — ONDANSETRON HCL 4 MG TABLET
ORAL_TABLET | Freq: Every day | ORAL | 0 refills | 20.00 days | Status: CP | PRN
Start: 2024-03-08 — End: 2025-03-08

## 2024-03-08 MED ORDER — INSULIN GLARGINE (U-100) 100 UNIT/ML (3 ML) SUBCUTANEOUS PEN
Freq: Every evening | SUBCUTANEOUS | 12 refills | 75.00 days | Status: CP
Start: 2024-03-08 — End: 2025-03-08

## 2024-03-15 MED ORDER — OMEPRAZOLE 20 MG CAPSULE,DELAYED RELEASE
ORAL_CAPSULE | 2 refills | 0 days
Start: 2024-03-15 — End: ?

## 2024-03-28 ENCOUNTER — Encounter: Admit: 2024-03-28 | Discharge: 2024-03-29 | Attending: Psychiatric/Mental Health | Primary: Psychiatric/Mental Health

## 2024-03-28 DIAGNOSIS — F331 Major depressive disorder, recurrent, moderate: Principal | ICD-10-CM

## 2024-03-28 MED ORDER — SERTRALINE 100 MG TABLET
ORAL_TABLET | Freq: Every day | ORAL | 0 refills | 90.00 days | Status: CP
Start: 2024-03-28 — End: 2024-06-26

## 2024-04-18 MED ORDER — TICAGRELOR 90 MG TABLET
ORAL_TABLET | Freq: Two times a day (BID) | ORAL | 1 refills | 90.00000 days
Start: 2024-04-18 — End: 2025-04-18

## 2024-04-27 MED ORDER — QUETIAPINE 300 MG TABLET
ORAL_TABLET | 0 refills | 0.00000 days
Start: 2024-04-27 — End: ?

## 2024-04-29 MED ORDER — QUETIAPINE 300 MG TABLET
ORAL_TABLET | ORAL | 0 refills | 0.00000 days | Status: CP
Start: 2024-04-29 — End: ?

## 2024-05-02 ENCOUNTER — Ambulatory Visit
Admit: 2024-05-02 | Discharge: 2024-05-03 | Payer: Medicare (Managed Care) | Attending: Psychiatric/Mental Health | Primary: Psychiatric/Mental Health

## 2024-05-02 DIAGNOSIS — G2581 Restless legs syndrome: Principal | ICD-10-CM

## 2024-05-02 DIAGNOSIS — F3175 Bipolar disorder, in partial remission, most recent episode depressed: Principal | ICD-10-CM

## 2024-05-02 MED ORDER — QUETIAPINE 300 MG TABLET
ORAL_TABLET | Freq: Every evening | ORAL | 0 refills | 90.00000 days
Start: 2024-05-02 — End: ?

## 2024-05-17 MED ORDER — TIZANIDINE 4 MG TABLET
ORAL_TABLET | Freq: Two times a day (BID) | ORAL | 0 refills | 0.00000 days
Start: 2024-05-17 — End: ?

## 2024-05-18 DIAGNOSIS — Z794 Long term (current) use of insulin: Principal | ICD-10-CM

## 2024-05-18 DIAGNOSIS — E1122 Type 2 diabetes mellitus with diabetic chronic kidney disease: Principal | ICD-10-CM

## 2024-05-18 DIAGNOSIS — N186 End stage renal disease: Principal | ICD-10-CM

## 2024-05-18 DIAGNOSIS — Z992 Dependence on renal dialysis: Principal | ICD-10-CM

## 2024-05-18 MED ORDER — BD NANO 2ND GEN PEN NEEDLE 32 GAUGE X 5/32" (4 MM)
0 refills | 0.00000 days
Start: 2024-05-18 — End: ?

## 2024-05-20 MED ORDER — TIZANIDINE 4 MG TABLET
ORAL_TABLET | Freq: Two times a day (BID) | ORAL | 0 refills | 90.00000 days
Start: 2024-05-20 — End: ?

## 2024-05-20 MED ORDER — BD NANO 2ND GEN PEN NEEDLE 32 GAUGE X 5/32" (4 MM)
0 refills | 0.00000 days
Start: 2024-05-20 — End: ?

## 2024-05-24 ENCOUNTER — Encounter: Admit: 2024-05-24 | Discharge: 2024-05-25 | Payer: MEDICARE

## 2024-05-24 DIAGNOSIS — F419 Anxiety disorder, unspecified: Principal | ICD-10-CM

## 2024-05-24 DIAGNOSIS — M62838 Other muscle spasm: Principal | ICD-10-CM

## 2024-05-24 DIAGNOSIS — I10 Essential (primary) hypertension: Principal | ICD-10-CM

## 2024-05-24 MED ORDER — CHLORTHALIDONE 25 MG TABLET
ORAL_TABLET | Freq: Every morning | ORAL | 0 refills | 90.00000 days | Status: CP
Start: 2024-05-24 — End: 2024-08-22

## 2024-05-24 MED ORDER — HYDROXYZINE HCL 50 MG TABLET
ORAL_TABLET | Freq: Every evening | ORAL | 0 refills | 90.00000 days | Status: CP | PRN
Start: 2024-05-24 — End: 2024-08-22

## 2024-05-24 MED ORDER — TIZANIDINE 4 MG TABLET
ORAL_TABLET | Freq: Two times a day (BID) | ORAL | 0 refills | 90.00000 days | Status: CP | PRN
Start: 2024-05-24 — End: 2024-08-22

## 2024-05-24 MED ORDER — METOPROLOL SUCCINATE ER 50 MG TABLET,EXTENDED RELEASE 24 HR
ORAL_TABLET | Freq: Every day | ORAL | 3 refills | 90.00000 days | Status: CP
Start: 2024-05-24 — End: 2024-08-22

## 2024-06-15 DIAGNOSIS — E785 Hyperlipidemia, unspecified: Principal | ICD-10-CM

## 2024-06-15 MED ORDER — TICAGRELOR 90 MG TABLET
ORAL_TABLET | Freq: Two times a day (BID) | ORAL | 0 refills | 0.00000 days
Start: 2024-06-15 — End: ?

## 2024-06-15 MED ORDER — ATORVASTATIN 80 MG TABLET
ORAL_TABLET | 1 refills | 0.00000 days
Start: 2024-06-15 — End: ?

## 2024-06-17 MED ORDER — ATORVASTATIN 80 MG TABLET
ORAL_TABLET | 1 refills | 0.00000 days
Start: 2024-06-17 — End: ?

## 2024-06-17 MED ORDER — TICAGRELOR 90 MG TABLET
ORAL_TABLET | Freq: Two times a day (BID) | ORAL | 0 refills | 30.00000 days
Start: 2024-06-17 — End: ?

## 2024-06-18 MED ORDER — TICAGRELOR 90 MG TABLET
ORAL_TABLET | Freq: Two times a day (BID) | ORAL | 0 refills | 30.00000 days
Start: 2024-06-18 — End: 2025-06-18

## 2024-06-18 MED ORDER — OMEPRAZOLE 20 MG CAPSULE,DELAYED RELEASE
ORAL_CAPSULE | Freq: Two times a day (BID) | ORAL | 2 refills | 90.00000 days
Start: 2024-06-18 — End: ?

## 2024-06-18 MED ORDER — LAMOTRIGINE 150 MG TABLET
ORAL_TABLET | Freq: Every day | ORAL | 0 refills | 90.00000 days
Start: 2024-06-18 — End: 2024-09-16

## 2024-06-23 DIAGNOSIS — Z992 Dependence on renal dialysis: Principal | ICD-10-CM

## 2024-06-23 DIAGNOSIS — Z794 Long term (current) use of insulin: Principal | ICD-10-CM

## 2024-06-23 DIAGNOSIS — E1122 Type 2 diabetes mellitus with diabetic chronic kidney disease: Principal | ICD-10-CM

## 2024-06-23 DIAGNOSIS — N186 End stage renal disease: Principal | ICD-10-CM

## 2024-06-23 MED ORDER — INSULIN ASPART (U-100) 100 UNIT/ML (3 ML) SUBCUTANEOUS PEN
12 refills | 0.00000 days
Start: 2024-06-23 — End: ?

## 2024-06-24 MED ORDER — INSULIN ASPART (U-100) 100 UNIT/ML (3 ML) SUBCUTANEOUS PEN
12 refills | 0.00000 days
Start: 2024-06-24 — End: ?

## 2024-07-04 DIAGNOSIS — F331 Major depressive disorder, recurrent, moderate: Principal | ICD-10-CM

## 2024-07-04 DIAGNOSIS — F4323 Adjustment disorder with mixed anxiety and depressed mood: Principal | ICD-10-CM

## 2024-07-07 MED ORDER — OMEPRAZOLE 20 MG CAPSULE,DELAYED RELEASE
ORAL_CAPSULE | 2 refills | 0.00000 days
Start: 2024-07-07 — End: ?

## 2024-07-08 DIAGNOSIS — I10 Essential (primary) hypertension: Principal | ICD-10-CM

## 2024-07-08 MED ORDER — METOPROLOL SUCCINATE ER 50 MG TABLET,EXTENDED RELEASE 24 HR
ORAL_TABLET | Freq: Every day | ORAL | 3 refills | 90.00000 days | Status: CP
Start: 2024-07-08 — End: ?

## 2024-07-08 MED ORDER — OMEPRAZOLE 20 MG CAPSULE,DELAYED RELEASE
ORAL_CAPSULE | 2 refills | 0.00000 days
Start: 2024-07-08 — End: ?

## 2024-07-08 MED ORDER — LAMOTRIGINE 150 MG TABLET
ORAL_TABLET | Freq: Every day | ORAL | 0 refills | 90.00000 days | Status: CP
Start: 2024-07-08 — End: 2024-10-06

## 2024-07-24 DIAGNOSIS — F3175 Bipolar disorder, in partial remission, most recent episode depressed: Principal | ICD-10-CM

## 2024-07-24 MED ORDER — QUETIAPINE 300 MG TABLET
ORAL_TABLET | Freq: Every evening | ORAL | 0 refills | 90.00000 days | Status: CP
Start: 2024-07-24 — End: ?

## 2024-07-24 MED ORDER — SERTRALINE 100 MG TABLET
ORAL_TABLET | ORAL | 0 refills | 0.00000 days | Status: CP
Start: 2024-07-24 — End: ?

## 2024-08-04 DIAGNOSIS — M792 Neuralgia and neuritis, unspecified: Principal | ICD-10-CM

## 2024-08-04 MED ORDER — PREGABALIN 25 MG CAPSULE
ORAL_CAPSULE | Freq: Every day | ORAL | 0 refills | 30.00000 days | Status: CP
Start: 2024-08-04 — End: ?

## 2024-08-15 DIAGNOSIS — G4700 Insomnia, unspecified: Principal | ICD-10-CM

## 2024-08-15 DIAGNOSIS — F331 Major depressive disorder, recurrent, moderate: Principal | ICD-10-CM

## 2024-08-20 DIAGNOSIS — M792 Neuralgia and neuritis, unspecified: Principal | ICD-10-CM

## 2024-08-20 DIAGNOSIS — M62838 Other muscle spasm: Principal | ICD-10-CM

## 2024-08-20 DIAGNOSIS — E785 Hyperlipidemia, unspecified: Principal | ICD-10-CM

## 2024-08-20 MED ORDER — TIZANIDINE 4 MG TABLET
ORAL_TABLET | Freq: Two times a day (BID) | ORAL | 0 refills | 30.00000 days | Status: CP | PRN
Start: 2024-08-20 — End: 2024-11-18

## 2024-08-20 MED ORDER — GABAPENTIN 300 MG CAPSULE
ORAL_CAPSULE | Freq: Every evening | ORAL | 0 refills | 90.00000 days | Status: CP
Start: 2024-08-20 — End: 2025-08-20

## 2024-08-20 MED ORDER — ATORVASTATIN 80 MG TABLET
ORAL_TABLET | Freq: Every day | ORAL | 1 refills | 90.00000 days | Status: CP
Start: 2024-08-20 — End: 2025-08-20

## 2024-09-05 MED ORDER — AMLODIPINE 10 MG TABLET
ORAL_TABLET | Freq: Every day | ORAL | 3 refills | 90.00000 days
Start: 2024-09-05 — End: ?

## 2024-09-06 MED ORDER — AMLODIPINE 10 MG TABLET
ORAL_TABLET | Freq: Every day | ORAL | 3 refills | 90.00000 days | Status: CP
Start: 2024-09-06 — End: ?

## 2024-09-12 MED ORDER — AMLODIPINE 10 MG TABLET
ORAL_TABLET | Freq: Every day | ORAL | 3 refills | 90.00000 days
Start: 2024-09-12 — End: ?

## 2024-09-12 MED ORDER — BUMETANIDE 2 MG TABLET
ORAL_TABLET | ORAL | 0 refills | 0.00000 days | Status: CP
Start: 2024-09-12 — End: ?

## 2024-10-21 DIAGNOSIS — F3175 Bipolar disorder, in partial remission, most recent episode depressed: Principal | ICD-10-CM

## 2024-10-21 MED ORDER — LAMOTRIGINE 150 MG TABLET
ORAL_TABLET | Freq: Every day | ORAL | 0 refills | 90.00000 days | Status: CP
Start: 2024-10-21 — End: 2025-01-19

## 2024-10-21 MED ORDER — QUETIAPINE 300 MG TABLET
ORAL_TABLET | Freq: Every evening | ORAL | 0 refills | 90.00000 days | Status: CP
Start: 2024-10-21 — End: ?

## 2024-10-21 MED ORDER — SERTRALINE 100 MG TABLET
ORAL_TABLET | Freq: Every day | ORAL | 0 refills | 90.00000 days | Status: CP
Start: 2024-10-21 — End: 2025-01-19

## 2024-10-27 DIAGNOSIS — F3175 Bipolar disorder, in partial remission, most recent episode depressed: Principal | ICD-10-CM

## 2024-10-27 MED ORDER — QUETIAPINE 300 MG TABLET
ORAL_TABLET | 0 refills | 0.00000 days
Start: 2024-10-27 — End: ?

## 2024-10-28 MED ORDER — QUETIAPINE 300 MG TABLET
ORAL_TABLET | ORAL | 0 refills | 0.00000 days | Status: CP
Start: 2024-10-28 — End: ?

## 2024-12-09 DIAGNOSIS — I10 Essential (primary) hypertension: Principal | ICD-10-CM

## 2024-12-09 MED ORDER — CHLORTHALIDONE 25 MG TABLET
ORAL_TABLET | 0 refills | 0.00000 days
Start: 2024-12-09 — End: ?
